# Patient Record
Sex: Female | Born: 1960 | Race: Black or African American | Hispanic: No | Marital: Married | Smoking: Never smoker
Health system: Southern US, Community
[De-identification: ages and names within clinical notes are randomized; demographics above are authoritative.]

## PROBLEM LIST (undated history)

## (undated) DIAGNOSIS — M1990 Unspecified osteoarthritis, unspecified site: Secondary | ICD-10-CM

## (undated) DIAGNOSIS — J45909 Unspecified asthma, uncomplicated: Secondary | ICD-10-CM

## (undated) DIAGNOSIS — K635 Polyp of colon: Secondary | ICD-10-CM

## (undated) DIAGNOSIS — I10 Essential (primary) hypertension: Secondary | ICD-10-CM

## (undated) DIAGNOSIS — E7800 Pure hypercholesterolemia, unspecified: Secondary | ICD-10-CM

## (undated) DIAGNOSIS — K219 Gastro-esophageal reflux disease without esophagitis: Secondary | ICD-10-CM

## (undated) DIAGNOSIS — N6019 Diffuse cystic mastopathy of unspecified breast: Secondary | ICD-10-CM

## (undated) DIAGNOSIS — R05 Cough: Secondary | ICD-10-CM

## (undated) DIAGNOSIS — R059 Cough, unspecified: Secondary | ICD-10-CM

## (undated) DIAGNOSIS — R252 Cramp and spasm: Secondary | ICD-10-CM

## (undated) DIAGNOSIS — K5900 Constipation, unspecified: Secondary | ICD-10-CM

## (undated) DIAGNOSIS — N6489 Other specified disorders of breast: Secondary | ICD-10-CM

## (undated) DIAGNOSIS — IMO0002 Reserved for concepts with insufficient information to code with codable children: Secondary | ICD-10-CM

## (undated) HISTORY — DX: Essential (primary) hypertension: I10

## (undated) HISTORY — DX: Polyp of colon: K63.5

## (undated) HISTORY — DX: Other specified disorders of breast: N64.89

## (undated) HISTORY — PX: TUBAL LIGATION: SHX77

## (undated) HISTORY — DX: Unspecified asthma, uncomplicated: J45.909

## (undated) HISTORY — PX: UTERINE FIBROID SURGERY: SHX826

## (undated) HISTORY — DX: Diffuse cystic mastopathy of unspecified breast: N60.19

## (undated) HISTORY — DX: Constipation, unspecified: K59.00

## (undated) HISTORY — DX: Reserved for concepts with insufficient information to code with codable children: IMO0002

## (undated) HISTORY — DX: Pure hypercholesterolemia, unspecified: E78.00

---

## 1989-07-24 HISTORY — PX: MYOMECTOMY: SHX85

## 1995-07-25 HISTORY — PX: ABDOMINAL HYSTERECTOMY: SHX81

## 1995-07-25 HISTORY — PX: OTHER SURGICAL HISTORY: SHX169

## 2004-11-10 ENCOUNTER — Ambulatory Visit: Payer: Self-pay | Admitting: General Surgery

## 2005-11-23 ENCOUNTER — Ambulatory Visit: Payer: Self-pay | Admitting: General Surgery

## 2006-11-27 ENCOUNTER — Ambulatory Visit: Payer: Self-pay | Admitting: General Surgery

## 2007-07-25 DIAGNOSIS — J4520 Mild intermittent asthma, uncomplicated: Secondary | ICD-10-CM | POA: Diagnosis present

## 2007-07-25 DIAGNOSIS — J45909 Unspecified asthma, uncomplicated: Secondary | ICD-10-CM

## 2007-07-25 HISTORY — DX: Unspecified asthma, uncomplicated: J45.909

## 2007-11-28 ENCOUNTER — Ambulatory Visit: Payer: Self-pay | Admitting: General Surgery

## 2008-09-04 ENCOUNTER — Ambulatory Visit: Payer: Self-pay

## 2008-12-01 ENCOUNTER — Ambulatory Visit: Payer: Self-pay | Admitting: General Surgery

## 2009-07-24 DIAGNOSIS — N6489 Other specified disorders of breast: Secondary | ICD-10-CM

## 2009-07-24 HISTORY — DX: Other specified disorders of breast: N64.89

## 2009-07-24 HISTORY — PX: BREAST SURGERY: SHX581

## 2009-12-24 ENCOUNTER — Ambulatory Visit: Payer: Self-pay | Admitting: General Surgery

## 2010-01-03 ENCOUNTER — Ambulatory Visit: Payer: Self-pay | Admitting: Family Medicine

## 2010-03-09 ENCOUNTER — Ambulatory Visit: Payer: Self-pay | Admitting: Family Medicine

## 2010-04-13 ENCOUNTER — Ambulatory Visit: Payer: Self-pay | Admitting: General Surgery

## 2010-12-26 ENCOUNTER — Ambulatory Visit: Payer: Self-pay | Admitting: General Surgery

## 2011-05-23 ENCOUNTER — Ambulatory Visit: Payer: Self-pay | Admitting: Family Medicine

## 2011-07-25 DIAGNOSIS — K635 Polyp of colon: Secondary | ICD-10-CM

## 2011-07-25 HISTORY — PX: NASAL SEPTUM SURGERY: SHX37

## 2011-07-25 HISTORY — DX: Polyp of colon: K63.5

## 2011-07-25 HISTORY — PX: COLONOSCOPY: SHX174

## 2011-08-11 ENCOUNTER — Ambulatory Visit: Payer: Self-pay

## 2011-11-23 ENCOUNTER — Ambulatory Visit: Payer: Self-pay | Admitting: Otolaryngology

## 2012-01-02 ENCOUNTER — Ambulatory Visit: Payer: Self-pay | Admitting: General Surgery

## 2012-02-14 ENCOUNTER — Ambulatory Visit: Payer: Self-pay | Admitting: General Surgery

## 2012-02-14 LAB — HM COLONOSCOPY

## 2012-04-30 ENCOUNTER — Ambulatory Visit: Payer: Self-pay | Admitting: Family Medicine

## 2012-06-06 ENCOUNTER — Ambulatory Visit: Payer: Self-pay | Admitting: Family Medicine

## 2013-01-02 ENCOUNTER — Ambulatory Visit: Payer: Self-pay | Admitting: General Surgery

## 2013-01-03 ENCOUNTER — Encounter: Payer: Self-pay | Admitting: General Surgery

## 2013-01-08 ENCOUNTER — Encounter: Payer: Self-pay | Admitting: General Surgery

## 2013-01-08 NOTE — Progress Notes (Unsigned)
This encounter was created in error - please disregard.

## 2013-01-09 ENCOUNTER — Ambulatory Visit (INDEPENDENT_AMBULATORY_CARE_PROVIDER_SITE_OTHER): Payer: BC Managed Care – PPO | Admitting: General Surgery

## 2013-01-09 ENCOUNTER — Encounter: Payer: Self-pay | Admitting: General Surgery

## 2013-01-09 VITALS — BP 122/84 | HR 70 | Resp 12 | Ht 65.0 in | Wt 209.0 lb

## 2013-01-09 DIAGNOSIS — Z860100 Personal history of colon polyps, unspecified: Secondary | ICD-10-CM | POA: Insufficient documentation

## 2013-01-09 DIAGNOSIS — Z1239 Encounter for other screening for malignant neoplasm of breast: Secondary | ICD-10-CM

## 2013-01-09 NOTE — Patient Instructions (Addendum)
Continue self breast exams. Call office for any new breast issues or concerns. Annual mammogram and office visit
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
## 2013-04-08 ENCOUNTER — Ambulatory Visit: Payer: Self-pay | Admitting: Family Medicine

## 2013-09-30 LAB — LIPID PANEL
CHOLESTEROL: 210 mg/dL — AB (ref 0–200)
HDL: 46 mg/dL (ref 35–70)
LDL Cholesterol: 155 mg/dL
TRIGLYCERIDES: 47 mg/dL (ref 40–160)

## 2013-09-30 LAB — BASIC METABOLIC PANEL
BUN: 12 mg/dL (ref 4–21)
CREATININE: 0.8 mg/dL (ref 0.5–1.1)
Glucose: 97 mg/dL
POTASSIUM: 3.7 mmol/L (ref 3.4–5.3)
SODIUM: 143 mmol/L (ref 137–147)

## 2013-09-30 LAB — TSH: TSH: 2.85 u[IU]/mL (ref 0.41–5.90)

## 2013-09-30 LAB — HEPATIC FUNCTION PANEL
ALT: 21 U/L (ref 7–35)
AST: 17 U/L (ref 13–35)
Alkaline Phosphatase: 70 U/L (ref 25–125)
Bilirubin, Total: 0.6 mg/dL

## 2013-11-11 ENCOUNTER — Ambulatory Visit: Payer: Self-pay | Admitting: Obstetrics and Gynecology

## 2014-01-19 ENCOUNTER — Encounter: Payer: Self-pay | Admitting: General Surgery

## 2014-01-27 ENCOUNTER — Encounter: Payer: Self-pay | Admitting: General Surgery

## 2014-01-27 ENCOUNTER — Ambulatory Visit (INDEPENDENT_AMBULATORY_CARE_PROVIDER_SITE_OTHER): Payer: BC Managed Care – PPO | Admitting: General Surgery

## 2014-01-27 VITALS — BP 126/76 | HR 80 | Resp 12 | Ht 65.0 in | Wt 216.0 lb

## 2014-01-27 DIAGNOSIS — Z86010 Personal history of colonic polyps: Secondary | ICD-10-CM

## 2014-01-27 DIAGNOSIS — N6019 Diffuse cystic mastopathy of unspecified breast: Secondary | ICD-10-CM

## 2014-01-27 NOTE — Patient Instructions (Addendum)
The patient has been asked to return to the office in one year with a bilateral screening mammogram. Continue self breast exams. Call office for any new breast issues or concerns.  

## 2014-01-27 NOTE — Progress Notes (Signed)
Patient ID: Gabrielle Terry, female   DOB: 01-10-1961, 53 y.o.   MRN: [id number]  Chief Complaint  Patient presents with  . Follow-up    mammogram    HPI Gabrielle Terry is a 53 y.o. female Who presents for a breast evaluation. The most recent mammogram was done on 01/15/14.Patient does  Perform self breast checks and get regular mammograms.   HPI  Past Medical History  Diagnosis Date  . Hypertension   . Asthma 2009  . Ulcer   . Diffuse cystic mastopathy   . Breast hematoma 2011    Right  . Colon polyp 2013    3    Past Surgical History  Procedure Laterality Date  . Cesarean section  1985, 1992  . Salpingo oophorectmy  1997  . Abdominal hysterectomy  1997  . Breast surgery Right 2011    hematoma  . Myomectomy  1991  . Nasal septum surgery  2013  . Colonoscopy  2013    3 polyps/ Dr Jamal Collin    History reviewed. No pertinent family history.  Social History History  Substance Use Topics  . Smoking status: Never Smoker   . Smokeless tobacco: Never Used  . Alcohol Use: No    No Known Allergies  Current Outpatient Prescriptions  Medication Sig Dispense Refill  . albuterol (PROVENTIL HFA;VENTOLIN HFA) 108 (90 BASE) MCG/ACT inhaler Inhale 2 puffs into the lungs every 6 (six) hours as needed for wheezing.      Marland Kitchen amLODipine (NORVASC) 2.5 MG tablet Take 1 tablet by mouth daily.      . Fluticasone-Salmeterol (ADVAIR) 250-50 MCG/DOSE AEPB Inhale 1 puff into the lungs every 12 (twelve) hours.      Marland Kitchen losartan (COZAAR) 25 MG tablet Take 25 mg by mouth daily.      Marland Kitchen losartan-hydrochlorothiazide (HYZAAR) 100-25 MG per tablet Take 1 tablet by mouth daily.      . montelukast (SINGULAIR) 10 MG tablet Take 10 mg by mouth at bedtime.      . ranitidine (ZANTAC) 150 MG capsule Take 150 mg by mouth daily.       No current facility-administered medications for this visit.    Review of Systems Review of Systems  Constitutional: Negative.   Respiratory: Negative.   Cardiovascular:  Negative.     Blood pressure 126/76, pulse 80, resp. rate 12, height 5\' 5"  (1.651 m), weight 216 lb (97.977 kg).  Physical Exam Physical Exam  Constitutional: She is oriented to person, place, and time. She appears well-developed and well-nourished.  Eyes: Conjunctivae are normal. No scleral icterus.  Neck: Neck supple.  Cardiovascular: Normal rate, regular rhythm and normal heart sounds.   Pulmonary/Chest: Effort normal and breath sounds normal. Right breast exhibits no inverted nipple, no mass, no nipple discharge, no skin change and no tenderness. Left breast exhibits no inverted nipple, no mass, no nipple discharge, no skin change and no tenderness.  Abdominal: Soft. Bowel sounds are normal. There is no tenderness.  Lymphadenopathy:    She has no cervical adenopathy.    She has no axillary adenopathy.  Neurological: She is alert and oriented to person, place, and time.  Skin: Skin is warm and dry.    Data Reviewed Mammogram reviewed  Assessment    Stable exam     Plan    The patient has been asked to return to the office in one year with a bilateral screening mammogram.        Annora Guderian G 01/28/2014, 5:59 AM

## 2014-01-28 ENCOUNTER — Encounter: Payer: Self-pay | Admitting: General Surgery

## 2014-01-28 DIAGNOSIS — N6019 Diffuse cystic mastopathy of unspecified breast: Secondary | ICD-10-CM | POA: Insufficient documentation

## 2014-02-19 ENCOUNTER — Ambulatory Visit: Payer: Self-pay | Admitting: Family Medicine

## 2014-02-21 ENCOUNTER — Ambulatory Visit: Payer: Self-pay | Admitting: Family Medicine

## 2014-05-25 ENCOUNTER — Encounter: Payer: Self-pay | Admitting: General Surgery

## 2014-10-27 LAB — CBC AND DIFFERENTIAL
HCT: 40 % (ref 36–46)
HEMOGLOBIN: 13.3 g/dL (ref 12.0–16.0)
Neutrophils Absolute: 4 /uL
Platelets: 241 10*3/uL (ref 150–399)
WBC: 7.4 10^3/mL

## 2014-11-25 ENCOUNTER — Other Ambulatory Visit: Payer: Self-pay

## 2014-11-25 DIAGNOSIS — Z1231 Encounter for screening mammogram for malignant neoplasm of breast: Secondary | ICD-10-CM

## 2014-12-18 DIAGNOSIS — M1710 Unilateral primary osteoarthritis, unspecified knee: Secondary | ICD-10-CM | POA: Insufficient documentation

## 2014-12-18 DIAGNOSIS — M179 Osteoarthritis of knee, unspecified: Secondary | ICD-10-CM | POA: Insufficient documentation

## 2015-01-27 ENCOUNTER — Encounter: Payer: Self-pay | Admitting: General Surgery

## 2015-01-27 ENCOUNTER — Ambulatory Visit (INDEPENDENT_AMBULATORY_CARE_PROVIDER_SITE_OTHER): Payer: BLUE CROSS/BLUE SHIELD | Admitting: General Surgery

## 2015-01-27 VITALS — BP 120/76 | HR 76 | Resp 12 | Ht 65.0 in | Wt 198.0 lb

## 2015-01-27 DIAGNOSIS — N6019 Diffuse cystic mastopathy of unspecified breast: Secondary | ICD-10-CM | POA: Diagnosis not present

## 2015-01-27 NOTE — Patient Instructions (Addendum)
Continue self breast exams. Call office for any new breast issues or concerns. Patient will be asked to return to the office in one year with a bilateral screening mammogram.

## 2015-01-27 NOTE — Progress Notes (Signed)
Patient ID: Gabrielle Terry, female   DOB: Jul 10, 1961, 54 y.o.   MRN: [id number]  Chief Complaint  Patient presents with  . Follow-up    HPI Gabrielle Terry is a 54 y.o. female.  who presents for a breast evaluation. The most recent mammogram was done on 01-20-15  Patient does perform regular self breast checks and gets regular mammograms done.  No new breast issues.  HPI  Past Medical History  Diagnosis Date  . Hypertension   . Asthma 2009  . Ulcer   . Diffuse cystic mastopathy   . Breast hematoma 2011    Right  . Colon polyp 2013    3    Past Surgical History  Procedure Laterality Date  . Cesarean section  1985, 1992  . Salpingo oophorectmy  1997  . Abdominal hysterectomy  1997  . Breast surgery Right 2011    hematoma  . Myomectomy  1991  . Nasal septum surgery  2013  . Colonoscopy  2013    3 polyps/ Dr Jamal Collin    History reviewed. No pertinent family history.  Social History History  Substance Use Topics  . Smoking status: Never Smoker   . Smokeless tobacco: Never Used  . Alcohol Use: No    No Known Allergies  Current Outpatient Prescriptions  Medication Sig Dispense Refill  . albuterol (PROVENTIL HFA;VENTOLIN HFA) 108 (90 BASE) MCG/ACT inhaler Inhale 2 puffs into the lungs every 6 (six) hours as needed for wheezing.    Marland Kitchen amLODipine (NORVASC) 2.5 MG tablet Take 1 tablet by mouth daily.    . diclofenac (VOLTAREN) 75 MG EC tablet TK 1 T PO  BID WITH FOOD  2  . Fluticasone-Salmeterol (ADVAIR) 250-50 MCG/DOSE AEPB Inhale 1 puff into the lungs every 12 (twelve) hours.    Marland Kitchen losartan-hydrochlorothiazide (HYZAAR) 100-25 MG per tablet Take 1 tablet by mouth daily.    Marland Kitchen lovastatin (MEVACOR) 20 MG tablet TK 1 T PO D HS  6  . MAGNESIUM-OXIDE 400 (241.3 MG) MG tablet TK 1 T PO TWO TIMES D  12  . montelukast (SINGULAIR) 10 MG tablet Take 10 mg by mouth at bedtime.    . ranitidine (ZANTAC) 150 MG capsule Take 150 mg by mouth daily.     No current facility-administered  medications for this visit.    Review of Systems Review of Systems  Constitutional: Negative.   Respiratory: Negative.   Cardiovascular: Negative.     Blood pressure 120/76, pulse 76, resp. rate 12, height 5\' 5"  (1.651 m), weight 198 lb (89.812 kg).  Physical Exam Physical Exam  Constitutional: She is oriented to person, place, and time. She appears well-developed and well-nourished.  HENT:  Mouth/Throat: Oropharynx is clear and moist.  Eyes: Conjunctivae are normal. No scleral icterus.  Neck: Neck supple.  Cardiovascular: Normal rate, regular rhythm and normal heart sounds.   Pulmonary/Chest: Effort normal and breath sounds normal. Right breast exhibits no inverted nipple, no mass, no nipple discharge, no skin change and no tenderness. Left breast exhibits no inverted nipple, no mass, no nipple discharge, no skin change and no tenderness.  Abdominal: Soft. There is no hepatomegaly. There is no tenderness.  Lymphadenopathy:    She has no cervical adenopathy.    She has no axillary adenopathy.  Neurological: She is alert and oriented to person, place, and time.  Skin: Skin is warm and dry.    Data Reviewed Mammogram reviewed and stable.   Assessment    Diffuse cystic mastopathy. Stable.  Plan    Continue self breast exams. Call office for any new breast issues or concerns. Patient will be asked to return to the office in one year with a bilateral screening mammogram.      PCP:  Cranford Mon, Mellissa Kohut 01/27/2015, 8:22 PM

## 2015-01-29 ENCOUNTER — Ambulatory Visit: Payer: Self-pay | Admitting: Family Medicine

## 2015-02-01 ENCOUNTER — Ambulatory Visit (INDEPENDENT_AMBULATORY_CARE_PROVIDER_SITE_OTHER): Payer: BLUE CROSS/BLUE SHIELD | Admitting: Family Medicine

## 2015-02-01 ENCOUNTER — Encounter: Payer: Self-pay | Admitting: Family Medicine

## 2015-02-01 VITALS — BP 98/52 | HR 80 | Temp 98.4°F | Resp 16 | Ht 65.0 in | Wt 215.0 lb

## 2015-02-01 DIAGNOSIS — J4540 Moderate persistent asthma, uncomplicated: Secondary | ICD-10-CM | POA: Diagnosis not present

## 2015-02-01 DIAGNOSIS — J019 Acute sinusitis, unspecified: Secondary | ICD-10-CM | POA: Diagnosis not present

## 2015-02-01 MED ORDER — FLUTICASONE-SALMETEROL 230-21 MCG/ACT IN AERO: 2.0000 | INHALATION_SPRAY | Freq: Two times a day (BID) | RESPIRATORY_TRACT | Status: DC

## 2015-02-01 MED ORDER — CEFDINIR 300 MG PO CAPS: 300.0000 mg | ORAL_CAPSULE | Freq: Two times a day (BID) | ORAL | Status: DC

## 2015-02-01 NOTE — Progress Notes (Signed)
Subjective:     Patient ID: Gabrielle Terry, female   DOB: 05-30-1961, 54 y.o.   MRN: [id number]  HPI  Chief Complaint  Patient presents with  . Cough    Patient comes in office today with concerns of cough and congestion for 6 months. She states that cough is productive of yellowish phlegm that has now turned into a dark color  States she has run out of her Advair inhaler and has been using albuterol 3 x day Reports yellow to dark sinus congestion with PND and accompanying cough.   Review of Systems  HENT:       States she has an ENT in [place] and is being considered for sinus surgery.   Respiratory:       Followed by Dr. Orvil Feil for allergies/asthma       Objective:   Physical Exam  Constitutional: She appears well-developed and well-nourished. She appears distressed.  Ears: T.M's intact without inflammation Sinuses: non-tender Throat: no tonsillar enlargement or exudate Neck: no cervical adenopathy Lungs: transient inspiratory wheezes     Assessment:    1. Acute sinusitis, recurrence not specified, unspecified locatio - cefdinir (OMNICEF) 300 MG capsule; Take 1 capsule (300 mg total) by mouth 2 (two) times daily.  Dispense: 20 capsule; Refill: 0  2. Asthma, moderate persistent, uncomplicated - fluticasone-salmeterol (ADVAIR HFA) 230-21 MCG/ACT inhaler; Inhale 2 puffs into the lungs 2 (two) times daily.  Dispense: 1 Inhaler; Refill: 12    Plan:   Resume Advair and continue albuterol. Call if asthma worsens for addition of oral prednisone.

## 2015-02-01 NOTE — Patient Instructions (Signed)
Continue albuterol as needed.Call if your asthma worsens despite treatment for course of prednisone.

## 2015-04-07 ENCOUNTER — Other Ambulatory Visit: Payer: Self-pay | Admitting: Family Medicine

## 2015-04-07 DIAGNOSIS — I10 Essential (primary) hypertension: Secondary | ICD-10-CM

## 2015-04-07 DIAGNOSIS — J309 Allergic rhinitis, unspecified: Secondary | ICD-10-CM | POA: Insufficient documentation

## 2015-04-07 NOTE — Telephone Encounter (Signed)
Last OV 10/2014  Thanks,   -Laura  

## 2015-04-09 NOTE — Telephone Encounter (Signed)
Pt is requesting refill for losartan-hydrochlorothiazide (HYZAAR) 100-25 MG per tablet to [place] stated that she took her last dose this morning. Thanks TNP

## 2015-04-12 ENCOUNTER — Other Ambulatory Visit: Payer: Self-pay

## 2015-04-12 DIAGNOSIS — I10 Essential (primary) hypertension: Secondary | ICD-10-CM

## 2015-04-12 MED ORDER — LOSARTAN POTASSIUM-HCTZ 100-25 MG PO TABS: 1.0000 | ORAL_TABLET | Freq: Every day | ORAL | Status: DC

## 2015-04-21 ENCOUNTER — Encounter: Payer: Self-pay | Admitting: *Deleted

## 2015-04-28 NOTE — Discharge Instructions (Signed)
[facility] °[facility] °ENDOSCOPIC SINUS SURGERY °[facility] ° °What is Functional Endoscopic Sinus Surgery? ° The Surgery involves making the natural openings of the sinuses larger by removing the bony partitions that separate the sinuses from the nasal cavity.  The natural sinus lining is preserved as much as possible to allow the sinuses to resume normal function after the surgery.  In some patients nasal polyps (excessively swollen lining of the sinuses) may be removed to relieve obstruction of the sinus openings.  The surgery is performed through the nose using lighted scopes, which eliminates the need for incisions on the face.  A septoplasty is a different procedure which is sometimes performed with sinus surgery.  It involves straightening the boy partition that separates the two sides of your nose.  A crooked or deviated septum may need repair if is obstructing the sinuses or nasal airflow.  Turbinate reduction is also often performed during sinus surgery.  The turbinates are bony proturberances from the side walls of the nose which swell and can obstruct the nose in patients with sinus and allergy problems.  Their size can be surgically reduced to help relieve nasal obstruction. ° °What Can Sinus Surgery Do For Me? ° Sinus surgery can reduce the frequency of sinus infections requiring antibiotic treatment.  This can provide improvement in nasal congestion, post-nasal drainage, facial pressure and nasal obstruction.  Surgery will NOT prevent you from ever having an infection again, so it usually only for patients who get infections 4 or more times yearly requiring antibiotics, or for infections that do not clear with antibiotics.  It will not cure nasal allergies, so patients with allergies may still require medication to treat their allergies after surgery. Surgery may improve headaches related to sinusitis, however, some people will continue to  require medication to control sinus headaches related to allergies.  Surgery will do nothing for other forms of headache (migraine, tension or cluster). ° °What Are the Risks of Endoscopic Sinus Surgery? ° Current techniques allow surgery to be performed safely with little risk, however, there are rare complications that patients should be aware of.  Because the sinuses are located around the eyes, there is risk of eye injury, including blindness, though again, this would be quite rare. This is usually a result of bleeding behind the eye during surgery, which puts the vision oat risk, though there are treatments to protect the vision and prevent permanent disrupted by surgery causing a leak of the spinal fluid that surrounds the brain.  More serious complications would include bleeding inside the brain cavity or damage to the brain.  Again, all of these complications are uncommon, and spinal fluid leaks can be safely managed surgically if they occur.  The most common complication of sinus surgery is bleeding from the nose, which may require packing or cauterization of the nose.  Continued sinus have polyps may experience recurrence of the polyps requiring revision surgery.  Alterations of sense of smell or injury to the tear ducts are also rare complications.  ° °What is the Surgery Like, and what is the Recovery? ° The Surgery usually takes a couple of hours to perform, and is usually performed under a general anesthetic (completely asleep).  Patients are usually discharged home after a couple of hours.  Sometimes during surgery it is necessary to pack the nose to control bleeding, and the packing is left in place for 24 - 48 hours, and removed by your surgeon.    If a septoplasty was performed during the procedure, there is often a splint placed which must be removed after 5-7 days.   °Discomfort: Pain is usually mild to moderate, and can be controlled by prescription pain medication or acetaminophen (Tylenol).   Aspirin, Ibuprofen (Advil, Motrin), or Naprosyn (Aleve) should be avoided, as they can cause increased bleeding.  Most patients feel sinus pressure like they have a bad head cold for several days.  Sleeping with your head elevated can help reduce swelling and facial pressure, as can ice packs over the face.  A humidifier may be helpful to keep the mucous and blood from drying in the nose.  ° °Diet: There are no specific diet restrictions, however, you should generally start with clear liquids and a light diet of bland foods because the anesthetic can cause some nausea.  Advance your diet depending on how your stomach feels.  Taking your pain medication with food will often help reduce stomach upset which pain medications can cause. ° °Nasal Saline Irrigation: It is important to remove blood clots and dried mucous from the nose as it is healing.  This is done by having you irrigate the nose at least 3 - 4 times daily with a salt water solution.  We recommend using NeilMed Sinus Rinse (available at the drug store).  Fill the squeeze bottle with the solution, bend over a sink, and insert the tip of the squeeze bottle into the nose ½ of an inch.  Point the tip of the squeeze bottle towards the inside corner of the eye on the same side your irrigating.  Squeeze the bottle and gently irrigate the nose.  If you bend forward as you do this, most of the fluid will flow back out of the nose, instead of down your throat.   The solution should be warm, near body temperature, when you irrigate.   Each time you irrigate, you should use a full squeeze bottle.  ° °Note that if you are instructed to use Nasal Steroid Sprays at any time after your surgery, irrigate with saline BEFORE using the steroid spray, so you do not wash it all out of the nose. °Another product, Nasal Saline Gel (such as AYR Nasal Saline Gel) can be applied in each nostril 3 - 4 times daily to moisture the nose and reduce scabbing or crusting. ° °Bleeding:   Bloody drainage from the nose can be expected for several days, and patients are instructed to irrigate their nose frequently with salt water to help remove mucous and blood clots.  The drainage may be dark red or brown, though some fresh blood may be seen intermittently, especially after irrigation.  Do not blow you nose, as bleeding may occur. If you must sneeze, keep your mouth open to allow air to escape through your mouth. ° °If heavy bleeding occurs: Irrigate the nose with saline to rinse out clots, then spray the nose 3 - 4 times with Afrin Nasal Decongestant Spray.  The spray will constrict the blood vessels to slow bleeding.  Pinch the lower half of your nose shut to apply pressure, and lay down with your head elevated.  Ice packs over the nose may help as well. If bleeding persists despite these measures, you should notify your doctor.  Do not use the Afrin routinely to control nasal congestion after surgery, as it can result in worsening congestion and may affect healing.  ° ° ° °Activity: Return to work varies among patients. Most patients will be   out of work at least 5 - 7 days to recover.  Patient may return to work after they are off of narcotic pain medication, and feeling well enough to perform the functions of their job.  Patients must avoid heavy lifting (over 10 pounds) or strenuous physical for 2 weeks after surgery, so your employer may need to assign you to light duty, or keep you out of work longer if light duty is not possible.  NOTE: you should not drive, operate dangerous machinery, do any mentally demanding tasks or make any important legal or financial decisions while on narcotic pain medication and recovering from the general anesthetic.  °  °Call Your Doctor Immediately if You Have Any of the Following: °1. Bleeding that you cannot control with the above measures °2. Loss of vision, double vision, bulging of the eye or black eyes. °3. Fever over 101 degrees °4. Neck stiffness with  severe headache, fever, nausea and change in mental state. °You are always encourage to call anytime with concerns, however, please call with requests for pain medication refills during office hours. ° °Office Endoscopy: During follow-up visits your doctor will remove any packing or splints that may have been placed and evaluate and clean your sinuses endoscopically.  Topical anesthetic will be used to make this as comfortable as possible, though you may want to take your pain medication prior to the visit.  How often this will need to be done varies from patient to patient.  After complete recovery from the surgery, you may need follow-up endoscopy from time to time, particularly if there is concern of recurrent infection or nasal polyps. ° °General Anesthesia, Adult, Care After °Refer to this sheet in the next few weeks. These instructions provide you with information on caring for yourself after your procedure. Your health care provider may also give you more specific instructions. Your treatment has been planned according to current medical practices, but problems sometimes occur. Call your health care provider if you have any problems or questions after your procedure. °WHAT TO EXPECT AFTER THE PROCEDURE °After the procedure, it is typical to experience: °· Sleepiness. °· Nausea and vomiting. °HOME CARE INSTRUCTIONS °· For the first 24 hours after general anesthesia: °¨ Have a responsible person with you. °¨ Do not drive a car. If you are alone, do not take public transportation. °¨ Do not drink alcohol. °¨ Do not take medicine that has not been prescribed by your health care provider. °¨ Do not sign important papers or make important decisions. °¨ You may resume a normal diet and activities as directed by your health care provider. °· Change bandages (dressings) as directed. °· If you have questions or problems that seem related to general anesthesia, call the hospital and ask for the anesthetist or  anesthesiologist on call. °SEEK MEDICAL CARE IF: °· You have nausea and vomiting that continue the day after anesthesia. °· You develop a rash. °SEEK IMMEDIATE MEDICAL CARE IF:  °· You have difficulty breathing. °· You have chest pain. °· You have any allergic problems. °  °This information is not intended to replace advice given to you by your health care provider. Make sure you discuss any questions you have with your health care provider. °  °Document Released: 10/16/2000 Document Revised: 07/31/2014 Document Reviewed: 11/08/2011 °Elsevier Interactive Patient Education ©2016 [employer] ° °

## 2015-04-29 ENCOUNTER — Ambulatory Visit: Payer: BLUE CROSS/BLUE SHIELD | Admitting: Anesthesiology

## 2015-04-29 ENCOUNTER — Ambulatory Visit
Admission: RE | Admit: 2015-04-29 | Discharge: 2015-04-29 | Disposition: A | Discharge status: Home / Self Care | Payer: BLUE CROSS/BLUE SHIELD | Source: Ambulatory Visit | Attending: Otolaryngology | Admitting: Otolaryngology

## 2015-04-29 ENCOUNTER — Encounter
Admission: RE | Disposition: A | Discharge status: Home / Self Care | Payer: Self-pay | Source: Ambulatory Visit | Attending: Otolaryngology

## 2015-04-29 DIAGNOSIS — J322 Chronic ethmoidal sinusitis: Secondary | ICD-10-CM | POA: Insufficient documentation

## 2015-04-29 DIAGNOSIS — J321 Chronic frontal sinusitis: Secondary | ICD-10-CM | POA: Insufficient documentation

## 2015-04-29 DIAGNOSIS — J320 Chronic maxillary sinusitis: Secondary | ICD-10-CM | POA: Insufficient documentation

## 2015-04-29 DIAGNOSIS — Z7951 Long term (current) use of inhaled steroids: Secondary | ICD-10-CM | POA: Diagnosis not present

## 2015-04-29 DIAGNOSIS — Z825 Family history of asthma and other chronic lower respiratory diseases: Secondary | ICD-10-CM | POA: Diagnosis not present

## 2015-04-29 DIAGNOSIS — Z79899 Other long term (current) drug therapy: Secondary | ICD-10-CM | POA: Insufficient documentation

## 2015-04-29 DIAGNOSIS — Z6835 Body mass index (BMI) 35.0-35.9, adult: Secondary | ICD-10-CM | POA: Insufficient documentation

## 2015-04-29 DIAGNOSIS — E669 Obesity, unspecified: Secondary | ICD-10-CM | POA: Insufficient documentation

## 2015-04-29 DIAGNOSIS — M542 Cervicalgia: Secondary | ICD-10-CM | POA: Diagnosis not present

## 2015-04-29 DIAGNOSIS — I10 Essential (primary) hypertension: Secondary | ICD-10-CM | POA: Insufficient documentation

## 2015-04-29 DIAGNOSIS — G8929 Other chronic pain: Secondary | ICD-10-CM | POA: Insufficient documentation

## 2015-04-29 DIAGNOSIS — J45909 Unspecified asthma, uncomplicated: Secondary | ICD-10-CM | POA: Diagnosis not present

## 2015-04-29 DIAGNOSIS — M722 Plantar fascial fibromatosis: Secondary | ICD-10-CM | POA: Diagnosis not present

## 2015-04-29 DIAGNOSIS — J323 Chronic sphenoidal sinusitis: Secondary | ICD-10-CM | POA: Diagnosis not present

## 2015-04-29 DIAGNOSIS — M25569 Pain in unspecified knee: Secondary | ICD-10-CM | POA: Insufficient documentation

## 2015-04-29 DIAGNOSIS — Z791 Long term (current) use of non-steroidal anti-inflammatories (NSAID): Secondary | ICD-10-CM | POA: Diagnosis not present

## 2015-04-29 DIAGNOSIS — J338 Other polyp of sinus: Secondary | ICD-10-CM | POA: Diagnosis not present

## 2015-04-29 DIAGNOSIS — Z8489 Family history of other specified conditions: Secondary | ICD-10-CM | POA: Diagnosis not present

## 2015-04-29 DIAGNOSIS — Z8249 Family history of ischemic heart disease and other diseases of the circulatory system: Secondary | ICD-10-CM | POA: Insufficient documentation

## 2015-04-29 DIAGNOSIS — M25519 Pain in unspecified shoulder: Secondary | ICD-10-CM | POA: Diagnosis not present

## 2015-04-29 HISTORY — DX: Gastro-esophageal reflux disease without esophagitis: K21.9

## 2015-04-29 HISTORY — PX: FRONTAL SINUS EXPLORATION: SHX6591

## 2015-04-29 HISTORY — DX: Unspecified osteoarthritis, unspecified site: M19.90

## 2015-04-29 HISTORY — DX: Cough, unspecified: R05.9

## 2015-04-29 HISTORY — DX: Cramp and spasm: R25.2

## 2015-04-29 HISTORY — DX: Cough: R05

## 2015-04-29 HISTORY — PX: IMAGE GUIDED SINUS SURGERY: SHX6570

## 2015-04-29 HISTORY — PX: ETHMOIDECTOMY: SHX5197

## 2015-04-29 HISTORY — PX: MAXILLARY ANTROSTOMY: SHX2003

## 2015-04-29 HISTORY — PX: SPHENOIDECTOMY: SHX2421

## 2015-04-29 HISTORY — DX: Pure hypercholesterolemia, unspecified: E78.00

## 2015-04-29 SURGERY — SINUS SURGERY, WITH IMAGING GUIDANCE
Anesthesia: General | Wound class: Clean Contaminated

## 2015-04-29 MED ORDER — CEFAZOLIN SODIUM-DEXTROSE 2-3 GM-% IV SOLR
2.0000 g | Freq: Once | INTRAVENOUS | Status: AC
  Administered 2015-04-29: 2 g via INTRAVENOUS

## 2015-04-29 MED ORDER — LIDOCAINE HCL (CARDIAC) 20 MG/ML IV SOLN
INTRAVENOUS | Status: DC | PRN
  Administered 2015-04-29: 40 mg via INTRAVENOUS

## 2015-04-29 MED ORDER — LACTATED RINGERS IV SOLN
INTRAVENOUS | Status: DC
  Administered 2015-04-29 (×2): via INTRAVENOUS

## 2015-04-29 MED ORDER — SCOPOLAMINE 1 MG/3DAYS TD PT72
1.0000 | MEDICATED_PATCH | TRANSDERMAL | Status: DC
  Administered 2015-04-29: 1.5 mg via TRANSDERMAL

## 2015-04-29 MED ORDER — MIDAZOLAM HCL 5 MG/5ML IJ SOLN
INTRAMUSCULAR | Status: DC | PRN
  Administered 2015-04-29: 2 mg via INTRAVENOUS

## 2015-04-29 MED ORDER — OXYCODONE HCL 5 MG PO TABS: 5.0000 mg | ORAL_TABLET | Freq: Once | ORAL | Status: DC | PRN

## 2015-04-29 MED ORDER — OXYMETAZOLINE HCL 0.05 % NA SOLN
2.0000 | Freq: Once | NASAL | Status: AC
  Administered 2015-04-29: 2 via NASAL

## 2015-04-29 MED ORDER — ACETAMINOPHEN 10 MG/ML IV SOLN
1000.0000 mg | Freq: Once | INTRAVENOUS | Status: AC
  Administered 2015-04-29: 1000 mg via INTRAVENOUS

## 2015-04-29 MED ORDER — OXYCODONE HCL 5 MG/5ML PO SOLN: 5.0000 mg | Freq: Once | ORAL | Status: DC | PRN

## 2015-04-29 MED ORDER — FENTANYL CITRATE (PF) 100 MCG/2ML IJ SOLN: 25.0000 ug | INTRAMUSCULAR | Status: DC | PRN

## 2015-04-29 MED ORDER — DEXAMETHASONE SODIUM PHOSPHATE 4 MG/ML IJ SOLN
INTRAMUSCULAR | Status: DC | PRN
  Administered 2015-04-29: 10 mg via INTRAVENOUS

## 2015-04-29 MED ORDER — LABETALOL HCL 5 MG/ML IV SOLN
INTRAVENOUS | Status: DC | PRN
  Administered 2015-04-29: 5 mg via INTRAVENOUS

## 2015-04-29 MED ORDER — LIDOCAINE-EPINEPHRINE 1 %-1:100000 IJ SOLN
INTRAMUSCULAR | Status: DC | PRN
  Administered 2015-04-29: 3 mL

## 2015-04-29 MED ORDER — PHENYLEPHRINE HCL 0.5 % NA SOLN
NASAL | Status: DC | PRN
  Administered 2015-04-29: 30 mL via TOPICAL

## 2015-04-29 MED ORDER — FENTANYL CITRATE (PF) 100 MCG/2ML IJ SOLN
INTRAMUSCULAR | Status: DC | PRN
  Administered 2015-04-29 (×2): 50 ug via INTRAVENOUS
  Administered 2015-04-29: 100 ug via INTRAVENOUS
  Administered 2015-04-29 (×2): 50 ug via INTRAVENOUS

## 2015-04-29 MED ORDER — ONDANSETRON HCL 4 MG/2ML IJ SOLN
INTRAMUSCULAR | Status: DC | PRN
  Administered 2015-04-29: 4 mg via INTRAVENOUS

## 2015-04-29 MED ORDER — PROPOFOL 10 MG/ML IV BOLUS
INTRAVENOUS | Status: DC | PRN
  Administered 2015-04-29: 150 mg via INTRAVENOUS

## 2015-04-29 MED ORDER — ROCURONIUM BROMIDE 100 MG/10ML IV SOLN
INTRAVENOUS | Status: DC | PRN
  Administered 2015-04-29: 30 mg via INTRAVENOUS

## 2015-04-29 SURGICAL SUPPLY — 30 items
BALLOON SINUPLASTY SYSTEM (BALLOONS) IMPLANT
BATTERY INSTRU NAVIGATION (MISCELLANEOUS) ×12 IMPLANT
CANISTER SUCT 1200ML W/VALVE (MISCELLANEOUS) ×3 IMPLANT
CATH IV 18X1 1/4 SAFELET (CATHETERS) ×3 IMPLANT
COAGULATOR SUCT 8FR VV (MISCELLANEOUS) ×3 IMPLANT
DEVICE INFLATION SEID (MISCELLANEOUS) IMPLANT
DRAPE HEAD BAR (DRAPES) ×3 IMPLANT
GLOVE PI ULTRA LF STRL 7.5 (GLOVE) ×6 IMPLANT
GLOVE PI ULTRA NON LATEX 7.5 (GLOVE) ×3
IMPL PROPEL SINUS 23MML (Prosthesis and Implant ENT) ×4 IMPLANT
IMPLANT PROPEL SINUS 23MML (Prosthesis and Implant ENT) ×6 IMPLANT
IRRIGATOR 4MM STR (IRRIGATION / IRRIGATOR) ×3 IMPLANT
IV CATH 18X1 1/4 SAFELET (CATHETERS) ×2
IV NS 500ML (IV SOLUTION) ×2
IV NS 500ML BAXH (IV SOLUTION) ×4 IMPLANT
NAVIGATION MASK REG  ST (MISCELLANEOUS) ×3 IMPLANT
NEEDLE HYPO 25GX1X1/2 BEV (NEEDLE) ×3 IMPLANT
NEEDLE SPNL 25GX3.5 QUINCKE BL (NEEDLE) ×3 IMPLANT
NS IRRIG 500ML POUR BTL (IV SOLUTION) ×3 IMPLANT
PACK DRAPE NASAL/ENT (PACKS) ×3 IMPLANT
PACKING NASAL EPIS 4X2.4 XEROG (MISCELLANEOUS) ×3 IMPLANT
PAD GROUND ADULT SPLIT (MISCELLANEOUS) ×3 IMPLANT
PATTIES SURGICAL .5 X3 (DISPOSABLE) ×3 IMPLANT
SET HANDPIECE IRR DIEGO (MISCELLANEOUS) ×3 IMPLANT
SOL ANTI-FOG 6CC FOG-OUT (MISCELLANEOUS) ×2 IMPLANT
SOL FOG-OUT ANTI-FOG 6CC (MISCELLANEOUS) ×1
STRAP BODY AND KNEE 60X3 (MISCELLANEOUS) ×6 IMPLANT
SYR 3ML LL SCALE MARK (SYRINGE) ×3 IMPLANT
TOWEL OR 17X26 4PK STRL BLUE (TOWEL DISPOSABLE) ×3 IMPLANT
WATER STERILE IRR 500ML POUR (IV SOLUTION) ×3 IMPLANT

## 2015-04-29 NOTE — Transfer of Care (Signed)
Immediate Anesthesia Transfer of Care Note  Patient: Gabrielle Terry  Procedure(s) Performed: Procedure(s) with comments: IMAGE GUIDED SINUS SURGERY (N/A) - GAVE DISK TO CE CE MAXILLARY ANTROSTOMY WITH REMOVAL OF CONTENTS (Bilateral) FRONTAL SINUS EXPLORATION (Bilateral) TOTAL ETHMOIDECTOMY (Bilateral) SPHENOIDECTOMY (Bilateral)  Patient Location: PACU  Anesthesia Type: General ETT  Level of Consciousness: awake, alert  and patient cooperative  Airway and Oxygen Therapy: Patient Spontanous Breathing and Patient connected to supplemental oxygen  Post-op Assessment: Post-op Vital signs reviewed, Patient's Cardiovascular Status Stable, Respiratory Function Stable, Patent Airway and No signs of Nausea or vomiting  Post-op Vital Signs: Reviewed and stable  Complications: No apparent anesthesia complications

## 2015-04-29 NOTE — H&P (Signed)
  H&P has been reviewed and no changes necessary. To be downloaded later. 

## 2015-04-29 NOTE — Op Note (Signed)
04/29/2015  1:41 PM    Gabrielle Terry  [id number]   Pre-Op Dx:  Bilateral frontal sinusitis, bilateral ethmoid sinusitis, bilateral maxillary sinusitis, bilateral sphenoid sinusitis  Post-op Dx: Same  Proc: Bilateral endoscopic frontal sinusotomy, bilateral endoscopic total ethmoidectomy, bilateral endoscopic maxillary antrostomies with removal of contents, bilateral endoscopic sphenoid sinusotomy, use of image guided system   Surg:  [facility] Heit[facility]rater H  Anes:  GOT  EBL:  250 mL  Comp:  None  Findings:  Polypoid changes throughout all of the sinuses opened on both sides  Procedure: The patient was brought to the operating suite placed in a supine position. General anesthesia was given by oral endotracheal intubation. The the nose was prepped using cottonoid pledgets soaked in phenylephrine and Xylocaine. The image guided system was brought in and the CT scan was downloaded from the disc. The template was applied to the face and registered to the system. There was 0.5 mm of variance. The suction instruments were then registered to the system and checked for alignment, which was perfect.  The 0 scope was used to visualize left side first. The septum was straight and the airway was open all the way back. There was no evidence of significant polypoid tissue in the nose. The middle turbinate was infractured and the side biter was used to incise the uncinate process. The Diego microdebrider was used to remove the rest of the uncinate and help open the maxillary antrum. There were polypoid changes throughout this area. These were all removed. The maxillary open opening was widened and make sure the natural ostium was included. The posterior middle ethmoid air cells were then opened using the [employer]. The image guided system was used to evaluate the depth of dissection. Polypoid changes were throughout all of the sinuses. There is a lot of ooze at first but then this settle down as the polyps  were all cleaned out. The 30 scope was used to open the middle and anterior ethmoid air cells, and then the frontal sinus duct was found. This was widened using the frontal sinus instruments. There were polypoid changes all around the outside of the duct that were cleaned away. The sphenoid sinus was then opened using the 0 scope. There were polypoid changes at the opening to the sphenoid and these were removed. The natural ostium was widened using sphenoid punch until I could see into the sphenoid sinus easily through the posterior ethmoid. This completed opening all the sinuses on the left side. Adenoid pledgets were placed here for vasoconstriction.  The right side was then visualized and opened in a similar fashion.  the 0 scope was used to visualize the nose and the middle turbinate was infractured. The uncinate process was incised with a side biter and then removed with the Interior. The maxillary antrum was opened and the natural ostium widened. There was polypoid disease the anterior superior sinus near the natural ostium that was removed. The rest the sinus was clean. The posterior middle ethmoid air cells were opened using the 0 scope and the image guided system to make sure all the air cells were opened. Each one was filled with polyps and removed. The 30 scope was used to open up the middle and anterior ethmoid air cells. Again there were polypoid changes throughout. Once these were opened the frontal sinus duct was found and had polypoid changes around it. This was trimmed and removed to make sure the frontal sinus duct was open. The image guided system was used  to make sure I can get up into the entire frontal sinus and that it was cleared out. Cottonoid pledget was placed here temporarily as well.  Both sides were revisualized to make sure the sinuses were all open and clear. There was minimal ooze at this time. The propel stent was placed in the ethmoid sinus on both sides and  xerogel was then placed into the inside of the propel stent and liquefy. The inferior airway was open and clear. This was repeated on the opposite side. The patient tolerated the procedure well. She was awakened and taken to the recovery room in satisfactory condition. There were no operative complications.   Dispo:   To PACU and then to be discharged home  Plan:  Follow-up in the office in 6 days for reevaluation. She will rest at home with her head elevated. Start saline flushes tomorrow. We'll use narcotics for pain if necessary. She will start prednisone taper tomorrow and use antibiotics for 1 week.  Gabrielle Terry H  04/29/2015 1:41 PM

## 2015-04-29 NOTE — Anesthesia Postprocedure Evaluation (Signed)
  Anesthesia Post-op Note  Patient: Gabrielle Terry  Procedure(s) Performed: Procedure(s) with comments: IMAGE GUIDED SINUS SURGERY (N/A) - GAVE DISK TO CE CE MAXILLARY ANTROSTOMY WITH REMOVAL OF CONTENTS (Bilateral) FRONTAL SINUS EXPLORATION (Bilateral) TOTAL ETHMOIDECTOMY (Bilateral) SPHENOIDECTOMY (Bilateral)  Anesthesia type:General ETT  Patient location: PACU  Post pain: Pain level controlled  Post assessment: Post-op Vital signs reviewed, Patient's Cardiovascular Status Stable, Respiratory Function Stable, Patent Airway and No signs of Nausea or vomiting  Post vital signs: Reviewed and stable  Last Vitals:  Filed Vitals:   04/29/15 1415  BP: 143/88  Pulse: 80  Temp:   Resp: 9    Level of consciousness: awake, alert  and patient cooperative  Complications: No apparent anesthesia complications

## 2015-04-29 NOTE — Anesthesia Preprocedure Evaluation (Addendum)
Anesthesia Evaluation  Patient identified by MRN, date of birth, ID band Patient awake    Reviewed: Allergy & Precautions, H&P , NPO status , Patient's Chart, lab work & pertinent test results  Airway Mallampati: II  TM Distance: >3 FB Neck ROM: full    Dental no notable dental hx.    Pulmonary asthma ,  Chronic sinusitus- chronic productive cough x 1 year   Pulmonary exam normal        Cardiovascular hypertension, On Medications Normal cardiovascular exam     Neuro/Psych    GI/Hepatic Neg liver ROS, GERD  Medicated and Controlled,  Endo/Other    Renal/GU negative Renal ROS     Musculoskeletal   Abdominal   Peds  Hematology negative hematology ROS (+)   Anesthesia Other Findings   Reproductive/Obstetrics                            Anesthesia Physical Anesthesia Plan  ASA: II  Anesthesia Plan: General ETT   Post-op Pain Management: MAC Combined w/ Regional for Post-op pain   Induction:   Airway Management Planned:   Additional Equipment:   Intra-op Plan:   Post-operative Plan:   Informed Consent: I have reviewed the patients History and Physical, chart, labs and discussed the procedure including the risks, benefits and alternatives for the proposed anesthesia with the patient or authorized representative who has indicated his/her understanding and acceptance.     Plan Discussed with: CRNA  Anesthesia Plan Comments:         Anesthesia Quick Evaluation

## 2015-04-29 NOTE — Transfer of Care (Deleted)
Immediate Anesthesia Transfer of Care Note  Patient: Gabrielle Terry  Procedure(s) Performed: Procedure(s) with comments: IMAGE GUIDED SINUS SURGERY (N/A) - GAVE DISK TO CE CE MAXILLARY ANTROSTOMY WITH REMOVAL OF CONTENTS (Bilateral) FRONTAL SINUS EXPLORATION (Bilateral) TOTAL ETHMOIDECTOMY (Bilateral) SPHENOIDECTOMY (Bilateral)  Patient Location: PACU  Anesthesia Type: General ETT  Level of Consciousness: awake, alert  and patient cooperative  Airway and Oxygen Therapy: Patient Spontanous Breathing and Patient connected to supplemental oxygen  Post-op Assessment: Post-op Vital signs reviewed, Patient's Cardiovascular Status Stable, Respiratory Function Stable, Patent Airway and No signs of Nausea or vomiting  Post-op Vital Signs: Reviewed and stable  Complications: No apparent anesthesia complications

## 2015-04-29 NOTE — Anesthesia Procedure Notes (Addendum)
Procedure Name: Intubation Date/Time: 04/29/2015 11:21 AM Performed by: Londell Moh Pre-anesthesia Checklist: Patient identified, Emergency Drugs available, Suction available, Patient being monitored and Timeout performed Patient Re-evaluated:Patient Re-evaluated prior to inductionOxygen Delivery Method: Circle system utilized Preoxygenation: Pre-oxygenation with 100% oxygen Intubation Type: IV induction Ventilation: Mask ventilation without difficulty Laryngoscope Size: Mac and 3 Grade View: Grade I Tube type: Oral Rae Tube size: 7.0 mm Number of attempts: 1 Placement Confirmation: ETT inserted through vocal cords under direct vision,  positive ETCO2 and breath sounds checked- equal and bilateral Tube secured with: Tape Dental Injury: Teeth and Oropharynx as per pre-operative assessment  Comments: Pt with productive cough. Pt took 2 puff of albuterol prior to going to OR. After AOI suctioned via ett for small amount of thick secretions.

## 2015-04-30 ENCOUNTER — Encounter: Payer: Self-pay | Admitting: Otolaryngology

## 2015-05-03 LAB — SURGICAL PATHOLOGY

## 2015-05-11 ENCOUNTER — Other Ambulatory Visit: Payer: Self-pay | Admitting: Family Medicine

## 2015-05-11 DIAGNOSIS — I10 Essential (primary) hypertension: Secondary | ICD-10-CM

## 2015-06-02 ENCOUNTER — Encounter: Payer: Self-pay | Admitting: Family Medicine

## 2015-09-14 ENCOUNTER — Ambulatory Visit (INDEPENDENT_AMBULATORY_CARE_PROVIDER_SITE_OTHER): Payer: BLUE CROSS/BLUE SHIELD | Admitting: Family Medicine

## 2015-09-14 ENCOUNTER — Encounter: Payer: Self-pay | Admitting: Family Medicine

## 2015-09-14 VITALS — BP 104/62 | HR 89 | Temp 100.4°F | Resp 18 | Wt 219.0 lb

## 2015-09-14 DIAGNOSIS — J4521 Mild intermittent asthma with (acute) exacerbation: Secondary | ICD-10-CM

## 2015-09-14 DIAGNOSIS — J301 Allergic rhinitis due to pollen: Secondary | ICD-10-CM

## 2015-09-14 MED ORDER — AZITHROMYCIN 250 MG PO TABS: ORAL_TABLET | ORAL | Status: DC

## 2015-09-14 MED ORDER — PREDNISONE 20 MG PO TABS: ORAL_TABLET | ORAL | Status: DC

## 2015-09-14 MED ORDER — MONTELUKAST SODIUM 10 MG PO TABS: 10.0000 mg | ORAL_TABLET | Freq: Every day | ORAL | Status: DC

## 2015-09-14 NOTE — Patient Instructions (Signed)
Schedule the albuterol inhaler every 4-6 hours while ill.

## 2015-09-14 NOTE — Progress Notes (Signed)
Subjective:     Patient ID: Gabrielle Terry, female   DOB: 07-02-1961, 55 y.o.   MRN: [id number]  HPI  Chief Complaint  Patient presents with  . Cough    Patient comes in office today with concerns of productive cough and congestion for the past 6 days. Patient reports she has had wheezing and chills, she has tried taking otc Sinus Max, Allegra and Delsym for relief.   States she is compliant with Advair and has started using her rescue inhaler as well. No flu shot this season. Wishes refill on Singulair.   Review of Systems  Constitutional: Negative for fever.       Objective:   Physical Exam  Constitutional: She appears well-developed and well-nourished. She has a sickly appearance. No distress.  Ears: T.M's intact without inflammation Throat: no tonsillar enlargement or exudate Neck: anterior cervical tenderness Lungs: Bilateral expiratory wheezing and coughing     Assessment:    1. Asthmatic bronchitis, mild intermittent, with acute exacerbation - predniSONE (DELTASONE) 20 MG tablet; Taper as follows: 3 pills for 4 days, two pills for 4 days, one pill for four days  Dispense: 24 tablet; Refill: 0 - azithromycin (ZITHROMAX Z-PAK) 250 MG tablet; Take two pills today then one pill daily  Dispense: 6 each; Refill: 0  2. Allergic rhinitis due to pollen - montelukast (SINGULAIR) 10 MG tablet; Take 1 tablet (10 mg total) by mouth at bedtime.  Dispense: 30 tablet; Refill: 11    Plan:    Schedule albuterol inhaler.

## 2015-10-04 ENCOUNTER — Other Ambulatory Visit: Payer: Self-pay | Admitting: Family Medicine

## 2015-10-14 ENCOUNTER — Other Ambulatory Visit: Payer: Self-pay | Admitting: Family Medicine

## 2015-10-14 DIAGNOSIS — R229 Localized swelling, mass and lump, unspecified: Secondary | ICD-10-CM | POA: Insufficient documentation

## 2015-10-14 DIAGNOSIS — N951 Menopausal and female climacteric states: Secondary | ICD-10-CM | POA: Insufficient documentation

## 2015-10-14 DIAGNOSIS — Z332 Encounter for elective termination of pregnancy: Secondary | ICD-10-CM | POA: Insufficient documentation

## 2015-10-14 DIAGNOSIS — E7800 Pure hypercholesterolemia, unspecified: Secondary | ICD-10-CM | POA: Insufficient documentation

## 2015-10-14 DIAGNOSIS — J45909 Unspecified asthma, uncomplicated: Secondary | ICD-10-CM | POA: Insufficient documentation

## 2015-10-14 DIAGNOSIS — E669 Obesity, unspecified: Secondary | ICD-10-CM | POA: Insufficient documentation

## 2015-10-14 DIAGNOSIS — M722 Plantar fascial fibromatosis: Secondary | ICD-10-CM | POA: Insufficient documentation

## 2015-10-14 DIAGNOSIS — K648 Other hemorrhoids: Secondary | ICD-10-CM | POA: Insufficient documentation

## 2015-10-14 DIAGNOSIS — G4700 Insomnia, unspecified: Secondary | ICD-10-CM | POA: Insufficient documentation

## 2015-10-14 DIAGNOSIS — R233 Spontaneous ecchymoses: Secondary | ICD-10-CM | POA: Insufficient documentation

## 2015-10-14 DIAGNOSIS — T6391XA Toxic effect of contact with unspecified venomous animal, accidental (unintentional), initial encounter: Secondary | ICD-10-CM | POA: Insufficient documentation

## 2015-10-14 DIAGNOSIS — R238 Other skin changes: Secondary | ICD-10-CM | POA: Insufficient documentation

## 2015-10-14 DIAGNOSIS — K219 Gastro-esophageal reflux disease without esophagitis: Secondary | ICD-10-CM | POA: Insufficient documentation

## 2015-10-14 MED ORDER — ALBUTEROL SULFATE HFA 108 (90 BASE) MCG/ACT IN AERS: 1.0000 | INHALATION_SPRAY | RESPIRATORY_TRACT | Status: DC | PRN

## 2015-10-14 NOTE — Telephone Encounter (Signed)
Please review, patient has not been seen for follow up since April 2016 with [place], she saw Mikki Santee this year once and last year once for acute visits, no future appointments have been made, Dr. Venia Minks refilled some of her medication-im not sure if she is 100% our patient and just sees who is available. Please review-aa

## 2015-10-14 NOTE — Telephone Encounter (Signed)
Pt needs refill proair inhaler oral inh 200 puffs 8.5gm  [facility] [address]  Thanks, Dentist

## 2015-10-14 NOTE — Telephone Encounter (Signed)
RX sent in and pt advised, appt made-aa

## 2015-10-14 NOTE — Telephone Encounter (Signed)
Three-month refills. Needs appointment with somebody this summer.

## 2015-11-16 ENCOUNTER — Ambulatory Visit (INDEPENDENT_AMBULATORY_CARE_PROVIDER_SITE_OTHER): Payer: BLUE CROSS/BLUE SHIELD | Admitting: Family Medicine

## 2015-11-16 VITALS — BP 122/80 | HR 78 | Temp 98.2°F | Resp 16 | Wt 213.0 lb

## 2015-11-16 DIAGNOSIS — K219 Gastro-esophageal reflux disease without esophagitis: Secondary | ICD-10-CM | POA: Diagnosis not present

## 2015-11-16 DIAGNOSIS — R1084 Generalized abdominal pain: Secondary | ICD-10-CM | POA: Diagnosis not present

## 2015-11-16 MED ORDER — PANTOPRAZOLE SODIUM 40 MG PO TBEC
40.0000 mg | DELAYED_RELEASE_TABLET | Freq: Two times a day (BID) | ORAL | Status: DC
Start: 2015-11-16 — End: 2016-01-24

## 2015-11-16 NOTE — Patient Instructions (Signed)
Alcohol, Caffeine, Mints and Nicotine make acid reflux worse.

## 2015-11-16 NOTE — Progress Notes (Signed)
Patient ID: Gabrielle Terry, female   DOB: 02-27-1961, 55 y.o.   MRN: [id number]   Gabrielle Terry  MRN: [id number] DOB: 1960-12-05  Subjective:  HPI   1. Gastroesophageal reflux disease, esophagitis presence not specified The patient is a 55 year old female who presents for evaluation of worsening acid reflux.  She has been using Ranitidine 150 mg twice daily with insufficient relief.  She states she can not even drink water without having indigestion and pain.  Her symptoms are worse at night.  She states that when she can burp she gets temporary relief.  Patient Active Problem List   Diagnosis Date Noted  . AB (asthmatic bronchitis) 10/14/2015  . Abnormal bruising 10/14/2015  . Toxic effect of venom 10/14/2015  . Airway hyperreactivity 10/14/2015  . Acid reflux 10/14/2015  . Elective abortion 10/14/2015  . Hypercholesteremia 10/14/2015  . Cannot sleep 10/14/2015  . Hemorrhoids, internal 10/14/2015  . Climacteric 10/14/2015  . Localized superficial swelling, mass, or lump 10/14/2015  . Adiposity 10/14/2015  . Plantar fasciitis 10/14/2015  . BP (high blood pressure) 04/07/2015  . Allergic rhinitis 04/07/2015  . Arthritis of knee, degenerative 12/18/2014  . Diffuse cystic mastopathy 01/28/2014  . Personal history of colonic polyps 01/09/2013    Past Medical History  Diagnosis Date  . Hypertension   . Asthma 2009  . Ulcer   . Diffuse cystic mastopathy   . Breast hematoma 2011    Right  . Colon polyp 2013    3  . Hypercholesteremia   . Foot cramps   . Arthritis     knees  . GERD (gastroesophageal reflux disease)   . Cough     from sinus drainage    Social History   Social History  . Marital Status: Single    Spouse Name: N/A  . Number of Children: N/A  . Years of Education: N/A   Occupational History  . Not on file.   Social History Main Topics  . Smoking status: Never Smoker   . Smokeless tobacco: Never Used  . Alcohol Use: 0.6 oz/week    1 Glasses of wine  per week  . Drug Use: No  . Sexual Activity: Not on file   Other Topics Concern  . Not on file   Social History Narrative    Outpatient Prescriptions Prior to Visit  Medication Sig Dispense Refill  . albuterol (PROVENTIL HFA;VENTOLIN HFA) 108 (90 Base) MCG/ACT inhaler Inhale 1-2 puffs into the lungs every 4 (four) hours as needed for wheezing. 1 Inhaler 12  . amLODipine (NORVASC) 2.5 MG tablet TAKE 1 TABLET BY MOUTH EVERY DAY 30 tablet 5  . fluticasone-salmeterol (ADVAIR HFA) 230-21 MCG/ACT inhaler Inhale 2 puffs into the lungs 2 (two) times daily. 1 Inhaler 12  . losartan-hydrochlorothiazide (HYZAAR) 100-25 MG per tablet Take 1 tablet by mouth daily. 30 tablet 6  . lovastatin (MEVACOR) 20 MG tablet TK 1 T PO D HS  6  . MAGNESIUM-OXIDE 400 (241.3 MG) MG tablet TK 1 T PO TWO TIMES D  12  . montelukast (SINGULAIR) 10 MG tablet Take 1 tablet (10 mg total) by mouth at bedtime. 30 tablet 11  . polyethylene glycol (MIRALAX / GLYCOLAX) packet Take 17 g by mouth daily.    . ranitidine (ZANTAC) 150 MG tablet TAKE 1 TABLET BY MOUTH TWICE DAILY 60 tablet 6  . azithromycin (ZITHROMAX Z-PAK) 250 MG tablet Take two pills today then one pill daily 6 each 0  . predniSONE (DELTASONE)  20 MG tablet Taper as follows: 3 pills for 4 days, two pills for 4 days, one pill for four days 24 tablet 0  . ranitidine (ZANTAC) 150 MG capsule Take 150 mg by mouth daily.     No facility-administered medications prior to visit.    No Known Allergies  Review of Systems  Constitutional: Negative for fever and malaise/fatigue.  Eyes: Negative.   Respiratory: Negative for cough, shortness of breath and wheezing.   Cardiovascular: Negative for chest pain, palpitations, orthopnea, claudication, leg swelling and PND.  Gastrointestinal: Positive for heartburn. Negative for nausea, vomiting, abdominal pain, diarrhea, constipation and blood in stool.  Neurological: Negative for dizziness, weakness and headaches.    Endo/Heme/Allergies: Negative.   Psychiatric/Behavioral: Negative.    Objective:  BP 122/80 mmHg  Pulse 78  Temp(Src) 98.2 F (36.8 C) (Oral)  Resp 16  Wt 213 lb (96.616 kg)  Physical Exam  Constitutional: She is oriented to person, place, and time and well-developed, well-nourished, and in no distress.  HENT:  Head: Normocephalic and atraumatic.  Right Ear: External ear normal.  Left Ear: External ear normal.  Nose: Nose normal.  Eyes: Conjunctivae are normal. Pupils are equal, round, and reactive to light.  Neck: Normal range of motion. Neck supple.  Cardiovascular: Normal rate, regular rhythm and normal heart sounds.   Pulmonary/Chest: Effort normal and breath sounds normal.  Abdominal: Soft.  Neurological: She is alert and oriented to person, place, and time. Gait normal.  Skin: Skin is warm and dry.  Psychiatric: Mood, memory, affect and judgment normal.    Assessment and Plan :   1. Gastroesophageal reflux disease, esophagitis presence not specified Elevate head of bed. - pantoprazole (PROTONIX) 40 MG tablet; Take 1 tablet (40 mg total) by mouth 2 (two) times daily.  Dispense: 60 tablet; Refill: 3 - CBC with Differential/Platelet - Comprehensive metabolic panel - Lipase - H. pylori antibody, IgG  2. Generalized abdominal pain  - CBC with Differential/Platelet - Comprehensive metabolic panel - Lipase - H. pylori antibody, IgG  I have done the exam and reviewed the above chart and it is accurate to the best of my knowledge.  Miguel Aschoff MD [facility] 11/16/2015 2:03 PM

## 2015-11-17 ENCOUNTER — Telehealth: Payer: Self-pay

## 2015-11-17 LAB — COMPREHENSIVE METABOLIC PANEL
A/G RATIO: 1.7 (ref 1.2–2.2)
ALBUMIN: 4.6 g/dL (ref 3.5–5.5)
ALT: 20 IU/L (ref 0–32)
AST: 19 IU/L (ref 0–40)
Alkaline Phosphatase: 78 IU/L (ref 39–117)
BILIRUBIN TOTAL: 0.5 mg/dL (ref 0.0–1.2)
BUN / CREAT RATIO: 21 (ref 9–23)
BUN: 17 mg/dL (ref 6–24)
CALCIUM: 10.2 mg/dL (ref 8.7–10.2)
CHLORIDE: 97 mmol/L (ref 96–106)
CO2: 25 mmol/L (ref 18–29)
Creatinine, Ser: 0.82 mg/dL (ref 0.57–1.00)
GFR, EST AFRICAN AMERICAN: 94 mL/min/{1.73_m2} (ref >59)
GFR, EST NON AFRICAN AMERICAN: 81 mL/min/{1.73_m2} (ref >59)
Globulin, Total: 2.7 g/dL (ref 1.5–4.5)
Glucose: 102 mg/dL — ABNORMAL HIGH (ref 65–99)
POTASSIUM: 4.2 mmol/L (ref 3.5–5.2)
Sodium: 141 mmol/L (ref 134–144)
TOTAL PROTEIN: 7.3 g/dL (ref 6.0–8.5)

## 2015-11-17 LAB — CBC WITH DIFFERENTIAL/PLATELET
BASOS: 0 %
Basophils Absolute: 0 10*3/uL (ref 0.0–0.2)
EOS (ABSOLUTE): 0.7 10*3/uL — ABNORMAL HIGH (ref 0.0–0.4)
EOS: 9 %
HEMOGLOBIN: 14 g/dL (ref 11.1–15.9)
Hematocrit: 41.4 % (ref 34.0–46.6)
IMMATURE GRANS (ABS): 0 10*3/uL (ref 0.0–0.1)
IMMATURE GRANULOCYTES: 0 %
LYMPHS: 31 %
Lymphocytes Absolute: 2.3 10*3/uL (ref 0.7–3.1)
MCH: 27.8 pg (ref 26.6–33.0)
MCHC: 33.8 g/dL (ref 31.5–35.7)
MCV: 82 fL (ref 79–97)
MONOCYTES: 3 %
Monocytes Absolute: 0.3 10*3/uL (ref 0.1–0.9)
NEUTROS ABS: 4.3 10*3/uL (ref 1.4–7.0)
NEUTROS PCT: 57 %
Platelets: 298 10*3/uL (ref 150–379)
RBC: 5.03 x10E6/uL (ref 3.77–5.28)
RDW: 15.5 % — ABNORMAL HIGH (ref 12.3–15.4)
WBC: 7.6 10*3/uL (ref 3.4–10.8)

## 2015-11-17 LAB — LIPASE: LIPASE: 31 U/L (ref 0–59)

## 2015-11-17 LAB — H. PYLORI ANTIBODY, IGG

## 2015-11-17 NOTE — Telephone Encounter (Signed)
LMTCB

## 2015-11-17 NOTE — Telephone Encounter (Signed)
Pt informed and voiced understanding of results. 

## 2015-11-17 NOTE — Telephone Encounter (Signed)
-----   Message from Jerrol Banana., MD sent at 11/17/2015  8:11 AM EDT ----- Labs normal.

## 2015-11-26 ENCOUNTER — Other Ambulatory Visit: Payer: Self-pay | Admitting: Family Medicine

## 2015-12-07 ENCOUNTER — Ambulatory Visit (INDEPENDENT_AMBULATORY_CARE_PROVIDER_SITE_OTHER): Payer: BLUE CROSS/BLUE SHIELD | Admitting: Family Medicine

## 2015-12-07 VITALS — BP 104/68 | HR 76 | Temp 98.5°F | Resp 12 | Wt 211.0 lb

## 2015-12-07 DIAGNOSIS — R1310 Dysphagia, unspecified: Secondary | ICD-10-CM

## 2015-12-07 DIAGNOSIS — K219 Gastro-esophageal reflux disease without esophagitis: Secondary | ICD-10-CM | POA: Diagnosis not present

## 2015-12-07 DIAGNOSIS — R1084 Generalized abdominal pain: Secondary | ICD-10-CM

## 2015-12-07 MED ORDER — SUCRALFATE 1 G PO TABS: 1.0000 g | ORAL_TABLET | Freq: Three times a day (TID) | ORAL | Status: DC

## 2015-12-07 MED ORDER — ALBUTEROL SULFATE HFA 108 (90 BASE) MCG/ACT IN AERS: 1.0000 | INHALATION_SPRAY | RESPIRATORY_TRACT | Status: DC | PRN

## 2015-12-07 NOTE — Progress Notes (Signed)
Patient ID: Gabrielle Terry, female   DOB: Apr 09, 1961, 55 y.o.   MRN: [id number]    Subjective:  HPI  Patient is here for follow up, last visit was April 26th. We switched Zantac to Protonix and patient states symptoms are the same. She is still having burning sensation in her throat and has hard time swallowing. She has not had any swallow studies done before. She has not been referred to ENT or GI yet.  Prior to Admission medications   Medication Sig Start Date End Date Taking? Authorizing Provider  albuterol (PROVENTIL HFA;VENTOLIN HFA) 108 (90 Base) MCG/ACT inhaler Inhale 1-2 puffs into the lungs every 4 (four) hours as needed for wheezing. 10/14/15   Jerrol Banana., MD  amLODipine (NORVASC) 2.5 MG tablet TAKE 1 TABLET BY MOUTH EVERY DAY 11/26/15   Jerrol Banana., MD  fluticasone-salmeterol (ADVAIR HFA) [id number] MCG/ACT inhaler Inhale 2 puffs into the lungs 2 (two) times daily. 02/01/15   Carmon Ginsberg, PA  losartan-hydrochlorothiazide (HYZAAR) 100-25 MG per tablet Take 1 tablet by mouth daily. 04/12/15   Richard Maceo Pro., MD  lovastatin (MEVACOR) 20 MG tablet TK 1 T PO D HS 11/18/14   Historical Provider, MD  MAGNESIUM-OXIDE 400 (241.3 MG) MG tablet TK 1 T PO TWO TIMES D 12/22/14   Historical Provider, MD  montelukast (SINGULAIR) 10 MG tablet Take 1 tablet (10 mg total) by mouth at bedtime. 09/14/15   Carmon Ginsberg, PA  pantoprazole (PROTONIX) 40 MG tablet Take 1 tablet (40 mg total) by mouth 2 (two) times daily. 11/16/15   Richard Maceo Pro., MD  polyethylene glycol [facility] Flor[facility]aveling) packet Take 17 g by mouth daily.    Historical Provider, MD  ranitidine (ZANTAC) 150 MG tablet TAKE 1 TABLET BY MOUTH TWICE DAILY 10/05/15   Jerrol Banana., MD    Patient Active Problem List   Diagnosis Date Noted  . AB (asthmatic bronchitis) 10/14/2015  . Abnormal bruising 10/14/2015  . Toxic effect of venom 10/14/2015  . Airway hyperreactivity 10/14/2015  . Acid reflux 10/14/2015    . Elective abortion 10/14/2015  . Hypercholesteremia 10/14/2015  . Cannot sleep 10/14/2015  . Hemorrhoids, internal 10/14/2015  . Climacteric 10/14/2015  . Localized superficial swelling, mass, or lump 10/14/2015  . Adiposity 10/14/2015  . Plantar fasciitis 10/14/2015  . BP (high blood pressure) 04/07/2015  . Allergic rhinitis 04/07/2015  . Arthritis of knee, degenerative 12/18/2014  . Diffuse cystic mastopathy 01/28/2014  . Personal history of colonic polyps 01/09/2013    Past Medical History  Diagnosis Date  . Hypertension   . Asthma 2009  . Ulcer   . Diffuse cystic mastopathy   . Breast hematoma 2011    Right  . Colon polyp 2013    3  . Hypercholesteremia   . Foot cramps   . Arthritis     knees  . GERD (gastroesophageal reflux disease)   . Cough     from sinus drainage    Social History   Social History  . Marital Status: Single    Spouse Name: N/A  . Number of Children: N/A  . Years of Education: N/A   Occupational History  . Not on file.   Social History Main Topics  . Smoking status: Never Smoker   . Smokeless tobacco: Never Used  . Alcohol Use: 0.6 oz/week    1 Glasses of wine per week  . Drug Use: No  . Sexual Activity: Not on file  Other Topics Concern  . Not on file   Social History Narrative    No Known Allergies  Review of Systems  Constitutional: Negative.   Respiratory: Positive for cough.   Cardiovascular: Negative.   Gastrointestinal: Positive for heartburn. Negative for nausea, vomiting and abdominal pain.       Dysphagia    Immunization History  Administered Date(s) Administered  . Tdap 08/27/2012   Objective:  BP 104/68 mmHg  Pulse 76  Temp(Src) 98.5 F (36.9 C)  Resp 12  Wt 211 lb (95.709 kg)  Physical Exam  Constitutional: She is oriented to person, place, and time and well-developed, well-nourished, and in no distress.  HENT:  Head: Atraumatic.  Eyes: Conjunctivae are normal. Pupils are equal, round, and  reactive to light.  Neck: Normal range of motion. Neck supple.  Cardiovascular: Normal rate, regular rhythm, normal heart sounds and intact distal pulses.   No murmur heard. Pulmonary/Chest: Effort normal and breath sounds normal.  Abdominal: Soft. She exhibits no distension and no mass. There is no tenderness. There is no rebound and no guarding.  Musculoskeletal: She exhibits no edema or tenderness.  Neurological: She is alert and oriented to person, place, and time.  Psychiatric: Mood, memory, affect and judgment normal.    Lab Results  Component Value Date   WBC 7.6 11/16/2015   HGB 13.3 10/27/2014   HCT 41.4 11/16/2015   PLT 298 11/16/2015   GLUCOSE 102* 11/16/2015   CHOL 210* 09/30/2013   TRIG 47 09/30/2013   HDL 46 09/30/2013   LDLCALC 155 09/30/2013   TSH 2.85 09/30/2013    CMP     Component Value Date/Time   NA 141 11/16/2015 1442   K 4.2 11/16/2015 1442   CL 97 11/16/2015 1442   CO2 25 11/16/2015 1442   GLUCOSE 102* 11/16/2015 1442   BUN 17 11/16/2015 1442   CREATININE 0.82 11/16/2015 1442   CREATININE 0.8 09/30/2013   CALCIUM 10.2 11/16/2015 1442   PROT 7.3 11/16/2015 1442   ALBUMIN 4.6 11/16/2015 1442   AST 19 11/16/2015 1442   ALT 20 11/16/2015 1442   ALKPHOS 78 11/16/2015 1442   BILITOT 0.5 11/16/2015 1442   GFRNONAA 81 11/16/2015 1442   GFRAA 94 11/16/2015 1442    Assessment and Plan :  1. Gastroesophageal reflux disease, esophagitis presence not specified Not better. Will add Carafate and refer to GI.  If GI work up is negative/normal may consider add Reglin but rather not go that route due to potential side effects. - sucralfate (CARAFATE) 1 g tablet; Take 1 tablet (1 g total) by mouth 4 (four) times daily -  with meals and at bedtime.  Dispense: 120 tablet; Refill: 1 - Ambulatory referral to Gastroenterology  2. Dysphagia See above plan. - sucralfate (CARAFATE) 1 g tablet; Take 1 tablet (1 g total) by mouth 4 (four) times daily -  with meals  and at bedtime.  Dispense: 120 tablet; Refill: 1 - Ambulatory referral to Gastroenterology  3. Generalized abdominal pain Improved. I have done the exam and reviewed the above chart and it is accurate to the best of my knowledge.  Patient was seen and examined by Dr. Eulas Post and note was scribed by Theressa Millard, RMA.   Miguel Aschoff MD [facility] 12/07/2015 4:07 PM

## 2015-12-21 DIAGNOSIS — R1310 Dysphagia, unspecified: Secondary | ICD-10-CM | POA: Diagnosis not present

## 2015-12-21 DIAGNOSIS — K219 Gastro-esophageal reflux disease without esophagitis: Secondary | ICD-10-CM | POA: Diagnosis not present

## 2015-12-29 ENCOUNTER — Other Ambulatory Visit: Payer: Self-pay | Admitting: Family Medicine

## 2016-01-03 ENCOUNTER — Ambulatory Visit: Payer: BLUE CROSS/BLUE SHIELD | Admitting: Family Medicine

## 2016-01-03 VITALS — BP 128/72 | HR 64 | Resp 16 | Wt 213.0 lb

## 2016-01-03 DIAGNOSIS — K219 Gastro-esophageal reflux disease without esophagitis: Secondary | ICD-10-CM | POA: Diagnosis not present

## 2016-01-03 NOTE — Progress Notes (Signed)
Patient ID: Gabrielle Terry, female   DOB: [birth year], 55 y.o.   MRN: [id number]   IRIDIAN HEINZMAN  MRN: [id number] DOB: Dec 08, 1960  Subjective:  HPI   The patient is a 55 year old female who presents for follow up of her acid reflux.  She was last seen on 12/07/15.  At that time she was started on Carafate and given a GI referral.  She has seen the GI PA and was given Ranitidine 150 mg to take BID and she has Endoscopy scheduled on 01/26/16.   She continues to have discomfort and difficulty eating anything without it feeling like it is stuck in her throat.  She states when this happens she has to make herself burp and that will help temporarily. Mother of 2 doing well. Patient Active Problem List   Diagnosis Date Noted  . AB (asthmatic bronchitis) 10/14/2015  . Abnormal bruising 10/14/2015  . Toxic effect of venom 10/14/2015  . Airway hyperreactivity 10/14/2015  . Acid reflux 10/14/2015  . Elective abortion 10/14/2015  . Hypercholesteremia 10/14/2015  . Cannot sleep 10/14/2015  . Hemorrhoids, internal 10/14/2015  . Climacteric 10/14/2015  . Localized superficial swelling, mass, or lump 10/14/2015  . Adiposity 10/14/2015  . Plantar fasciitis 10/14/2015  . BP (high blood pressure) 04/07/2015  . Allergic rhinitis 04/07/2015  . Arthritis of knee, degenerative 12/18/2014  . Diffuse cystic mastopathy 01/28/2014  . Personal history of colonic polyps 01/09/2013    Past Medical History  Diagnosis Date  . Hypertension   . Asthma 2009  . Ulcer   . Diffuse cystic mastopathy   . Breast hematoma 2011    Right  . Colon polyp 2013    3  . Hypercholesteremia   . Foot cramps   . Arthritis     knees  . GERD (gastroesophageal reflux disease)   . Cough     from sinus drainage    Social History   Social History  . Marital Status: Single    Spouse Name: N/A  . Number of Children: N/A  . Years of Education: N/A   Occupational History  . Not on file.   Social History Main Topics  .  Smoking status: Never Smoker   . Smokeless tobacco: Never Used  . Alcohol Use: 0.6 oz/week    1 Glasses of wine per week  . Drug Use: No  . Sexual Activity: Not on file   Other Topics Concern  . Not on file   Social History Narrative    Outpatient Prescriptions Prior to Visit  Medication Sig Dispense Refill  . albuterol (PROVENTIL HFA;VENTOLIN HFA) 108 (90 Base) MCG/ACT inhaler Inhale 1-2 puffs into the lungs every 4 (four) hours as needed for wheezing. 1 Inhaler 12  . amLODipine (NORVASC) 2.5 MG tablet TAKE 1 TABLET BY MOUTH EVERY DAY 30 tablet 12  . fluticasone-salmeterol (ADVAIR HFA) 230-21 MCG/ACT inhaler Inhale 2 puffs into the lungs 2 (two) times daily. 1 Inhaler 12  . losartan-hydrochlorothiazide (HYZAAR) 100-25 MG tablet TAKE 1 TABLET BY MOUTH DAILY 30 tablet 12  . lovastatin (MEVACOR) 20 MG tablet TK 1 T PO D HS  6  . MAGNESIUM-OXIDE 400 (241.3 MG) MG tablet TK 1 T PO TWO TIMES D  12  . montelukast (SINGULAIR) 10 MG tablet Take 1 tablet (10 mg total) by mouth at bedtime. 30 tablet 11  . pantoprazole (PROTONIX) 40 MG tablet Take 1 tablet (40 mg total) by mouth 2 (two) times daily. 60 tablet 3  .  polyethylene glycol (MIRALAX / GLYCOLAX) packet Take 17 g by mouth daily.    . sucralfate (CARAFATE) 1 g tablet Take 1 tablet (1 g total) by mouth 4 (four) times daily -  with meals and at bedtime. 120 tablet 1   No facility-administered medications prior to visit.    No Known Allergies  Review of Systems  Constitutional: Negative for fever and malaise/fatigue.  Respiratory: Positive for cough and wheezing. Negative for shortness of breath.   Cardiovascular: Negative for chest pain, palpitations, orthopnea, claudication, leg swelling and PND.  Gastrointestinal: Positive for heartburn. Negative for nausea, vomiting, abdominal pain, diarrhea, constipation, blood in stool and melena.  Neurological: Negative for dizziness, weakness and headaches.   Objective:  BP 128/72 mmHg  Pulse  64  Resp 16  Wt 213 lb (96.616 kg)  Physical Exam  Constitutional: She is oriented to person, place, and time and well-developed, well-nourished, and in no distress.  HENT:  Head: Normocephalic.  Mouth/Throat: Oropharynx is clear and moist.  Eyes: Pupils are equal, round, and reactive to light.  Neck: Normal range of motion. Neck supple.  Cardiovascular: Normal rate, regular rhythm and normal heart sounds.   Pulmonary/Chest: Effort normal and breath sounds normal.  Abdominal: Soft. Bowel sounds are normal. There is no tenderness. There is no guarding.  Neurological: She is alert and oriented to person, place, and time. Gait normal.  Skin: Skin is warm and dry.  Psychiatric: Mood, memory, affect and judgment normal.    Assessment and Plan :   GERD EGD pending.continue meds. Asthma AR RTC 6 months   Miguel Aschoff MD [facility] 01/03/2016 4:15 PM

## 2016-01-26 ENCOUNTER — Ambulatory Visit
Admission: RE | Admit: 2016-01-26 | Discharge: 2016-01-26 | Disposition: A | Discharge status: Home / Self Care | Payer: BLUE CROSS/BLUE SHIELD | Source: Ambulatory Visit | Attending: Unknown Physician Specialty | Admitting: Unknown Physician Specialty

## 2016-01-26 ENCOUNTER — Ambulatory Visit: Payer: BLUE CROSS/BLUE SHIELD | Admitting: Anesthesiology

## 2016-01-26 ENCOUNTER — Encounter
Admission: RE | Disposition: A | Discharge status: Home / Self Care | Payer: Self-pay | Source: Ambulatory Visit | Attending: Unknown Physician Specialty

## 2016-01-26 DIAGNOSIS — M170 Bilateral primary osteoarthritis of knee: Secondary | ICD-10-CM | POA: Diagnosis not present

## 2016-01-26 DIAGNOSIS — K209 Esophagitis, unspecified: Secondary | ICD-10-CM | POA: Diagnosis not present

## 2016-01-26 DIAGNOSIS — E7800 Pure hypercholesterolemia, unspecified: Secondary | ICD-10-CM | POA: Insufficient documentation

## 2016-01-26 DIAGNOSIS — Z8249 Family history of ischemic heart disease and other diseases of the circulatory system: Secondary | ICD-10-CM | POA: Diagnosis not present

## 2016-01-26 DIAGNOSIS — K3189 Other diseases of stomach and duodenum: Secondary | ICD-10-CM | POA: Insufficient documentation

## 2016-01-26 DIAGNOSIS — Z833 Family history of diabetes mellitus: Secondary | ICD-10-CM | POA: Insufficient documentation

## 2016-01-26 DIAGNOSIS — Z90710 Acquired absence of both cervix and uterus: Secondary | ICD-10-CM | POA: Diagnosis not present

## 2016-01-26 DIAGNOSIS — I10 Essential (primary) hypertension: Secondary | ICD-10-CM | POA: Diagnosis not present

## 2016-01-26 DIAGNOSIS — K210 Gastro-esophageal reflux disease with esophagitis: Secondary | ICD-10-CM | POA: Insufficient documentation

## 2016-01-26 DIAGNOSIS — R1310 Dysphagia, unspecified: Secondary | ICD-10-CM | POA: Diagnosis not present

## 2016-01-26 DIAGNOSIS — J45909 Unspecified asthma, uncomplicated: Secondary | ICD-10-CM | POA: Insufficient documentation

## 2016-01-26 DIAGNOSIS — Z79899 Other long term (current) drug therapy: Secondary | ICD-10-CM | POA: Insufficient documentation

## 2016-01-26 DIAGNOSIS — Z9889 Other specified postprocedural states: Secondary | ICD-10-CM | POA: Diagnosis not present

## 2016-01-26 DIAGNOSIS — Z8349 Family history of other endocrine, nutritional and metabolic diseases: Secondary | ICD-10-CM | POA: Diagnosis not present

## 2016-01-26 DIAGNOSIS — K2950 Unspecified chronic gastritis without bleeding: Secondary | ICD-10-CM | POA: Diagnosis not present

## 2016-01-26 DIAGNOSIS — K2960 Other gastritis without bleeding: Secondary | ICD-10-CM | POA: Diagnosis not present

## 2016-01-26 DIAGNOSIS — K2970 Gastritis, unspecified, without bleeding: Secondary | ICD-10-CM | POA: Diagnosis not present

## 2016-01-26 HISTORY — PX: ESOPHAGOGASTRODUODENOSCOPY (EGD) WITH PROPOFOL: SHX5813

## 2016-01-26 SURGERY — ESOPHAGOGASTRODUODENOSCOPY (EGD) WITH PROPOFOL
Anesthesia: General

## 2016-01-26 MED ORDER — LIDOCAINE HCL (CARDIAC) 20 MG/ML IV SOLN
INTRAVENOUS | Status: DC | PRN
  Administered 2016-01-26: 30 mg via INTRAVENOUS

## 2016-01-26 MED ORDER — PROPOFOL 500 MG/50ML IV EMUL
INTRAVENOUS | Status: DC | PRN
  Administered 2016-01-26: 120 ug/kg/min via INTRAVENOUS

## 2016-01-26 MED ORDER — SODIUM CHLORIDE 0.9 % IV SOLN
INTRAVENOUS | Status: DC
Start: 2016-01-26 — End: 2016-01-26
  Administered 2016-01-26: 1000 mL via INTRAVENOUS

## 2016-01-26 MED ORDER — MIDAZOLAM HCL 2 MG/2ML IJ SOLN
INTRAMUSCULAR | Status: DC | PRN
  Administered 2016-01-26: 1 mg via INTRAVENOUS

## 2016-01-26 MED ORDER — SODIUM CHLORIDE 0.9 % IV SOLN: INTRAVENOUS | Status: DC

## 2016-01-26 MED ORDER — FENTANYL CITRATE (PF) 100 MCG/2ML IJ SOLN
INTRAMUSCULAR | Status: DC | PRN
  Administered 2016-01-26: 50 ug via INTRAVENOUS

## 2016-01-26 NOTE — H&P (Signed)
Primary Care Physician:  Wilhemena Durie, MD Primary Gastroenterologist:  Dr. Vira Agar  Pre-Procedure History & Physical: HPI:  Gabrielle Terry is a 55 y.o. female is here for an endoscopy.   Past Medical History  Diagnosis Date  . Hypertension   . Asthma 2009  . Ulcer   . Diffuse cystic mastopathy   . Breast hematoma 2011    Right  . Colon polyp 2013    3  . Hypercholesteremia   . Foot cramps   . Arthritis     knees  . GERD (gastroesophageal reflux disease)   . Cough     from sinus drainage    Past Surgical History  Procedure Laterality Date  . Cesarean section  1985, 1992  . Salpingo oophorectmy  1997  . Abdominal hysterectomy  1997  . Breast surgery Right 2011    hematoma  . Myomectomy  1991  . Nasal septum surgery  2013  . Colonoscopy  2013    3 polyps/ Dr Jamal Collin  . Image guided sinus surgery N/A 04/29/2015    Procedure: IMAGE GUIDED SINUS SURGERY;  Surgeon: Margaretha Sheffield, MD;  Location: [place];  Service: ENT;  Laterality: N/A;  GAVE DISK TO CE CE  . Maxillary antrostomy Bilateral 04/29/2015    Procedure: MAXILLARY ANTROSTOMY WITH REMOVAL OF CONTENTS;  Surgeon: Margaretha Sheffield, MD;  Location: [place];  Service: ENT;  Laterality: Bilateral;  . Frontal sinus exploration Bilateral 04/29/2015    Procedure: FRONTAL SINUS EXPLORATION;  Surgeon: Margaretha Sheffield, MD;  Location: [place];  Service: ENT;  Laterality: Bilateral;  . Ethmoidectomy Bilateral 04/29/2015    Procedure: TOTAL ETHMOIDECTOMY;  Surgeon: Margaretha Sheffield, MD;  Location: [place];  Service: ENT;  Laterality: Bilateral;  . Sphenoidectomy Bilateral 04/29/2015    Procedure: Coralee Pesa;  Surgeon: Margaretha Sheffield, MD;  Location: [place];  Service: ENT;  Laterality: Bilateral;  . Tubal ligation    . Uterine fibroid surgery      removed    Prior to Admission medications   Medication Sig Start Date End Date Taking? Authorizing Provider  albuterol (PROVENTIL  HFA;VENTOLIN HFA) 108 (90 Base) MCG/ACT inhaler Inhale 1-2 puffs into the lungs every 4 (four) hours as needed for wheezing. 12/07/15  Yes Richard Maceo Pro., MD  amLODipine (NORVASC) 2.5 MG tablet TAKE 1 TABLET BY MOUTH EVERY DAY 11/26/15  Yes Jerrol Banana., MD  losartan-hydrochlorothiazide [facility]) 100-25 MG tablet TAKE 1 TABLET BY MOUTH DAILY 12/29/15  Yes Jerrol Banana., MD  lovastatin (MEVACOR) 20 MG tablet TK 1 T PO D HS 11/18/14  Yes Historical Provider, MD  montelukast (SINGULAIR) 10 MG tablet Take 1 tablet (10 mg total) by mouth at bedtime. 09/14/15  Yes Carmon Ginsberg, PA  polyethylene glycol (MIRALAX / GLYCOLAX) packet Take 17 g by mouth daily.   Yes Historical Provider, MD  sucralfate (CARAFATE) 1 g tablet Take 1 tablet (1 g total) by mouth 4 (four) times daily -  with meals and at bedtime. 12/07/15  Yes Richard Maceo Pro., MD    Allergies as of 12/31/2015  . (No Known Allergies)    Family History  Problem Relation Age of Onset  . Adopted: Yes  . Congestive Heart Failure Father   . Cancer Brother   . Other Brother     brain tumor  . Hypertension Maternal Aunt   . Thyroid disease Maternal Aunt   . Diabetes Maternal Grandmother  Social History   Social History  . Marital Status: Single    Spouse Name: N/A  . Number of Children: N/A  . Years of Education: N/A   Occupational History  . Not on file.   Social History Main Topics  . Smoking status: Never Smoker   . Smokeless tobacco: Never Used  . Alcohol Use: 0.6 oz/week    1 Glasses of wine per week  . Drug Use: No  . Sexual Activity: Not on file   Other Topics Concern  . Not on file   Social History Narrative    Review of Systems: See HPI, otherwise negative ROS  Physical Exam: BP 128/83 mmHg  Pulse 75  Temp(Src) 98.3 F (36.8 C) (Tympanic)  Resp 16  Ht 5\' 5"  (1.651 m)  Wt 92.08 kg (203 lb)  BMI 33.78 kg/m2  SpO2 96% General:   Alert,  pleasant and cooperative in NAD Head:   Normocephalic and atraumatic. Neck:  Supple; no masses or thyromegaly. Lungs:  Clear throughout to auscultation.    Heart:  Regular rate and rhythm. Abdomen:  Soft, nontender and nondistended. Normal bowel sounds, without guarding, and without rebound.   Neurologic:  Alert and  oriented x4;  grossly normal neurologically.  Impression/Plan: [name] is here for an endoscopy to be performed for dysphagia  Risks, benefits, limitations, and alternatives regarding  endoscopy have been reviewed with the patient.  Questions have been answered.  All parties agreeable.   Gaylyn Cheers, MD  01/26/2016, 11:01 AM

## 2016-01-26 NOTE — Op Note (Signed)
[facility] Gastroenterology Patient Name: Gabrielle Terry Procedure Date: 01/26/2016 11:04 AM MRN: [id number] Account #: [id number] Date of Birth: 1960-10-07 Admit Type: Outpatient Age: 55 Room: [facility] ENDO ROOM 4 Gender: Female Note Status: Finalized Procedure:            Upper GI endoscopy Indications:          Dysphagia, Heartburn Providers:            Manya Silvas, MD Referring MD:         Janine Ores. Rosanna Randy, MD (Referring MD) Medicines:            Propofol per Anesthesia Complications:        No immediate complications. Procedure:            Pre-Anesthesia Assessment:                       - After reviewing the risks and benefits, the patient                        was deemed in satisfactory condition to undergo the                        procedure.                       After obtaining informed consent, the endoscope was                        passed under direct vision. Throughout the procedure,                        the patient's blood pressure, pulse, and oxygen                        saturations were monitored continuously. The Endoscope                        was introduced through the mouth, and advanced to the                        second part of duodenum. The upper GI endoscopy was                        accomplished without difficulty. The patient tolerated                        the procedure well. Findings:      LA Grade A (one or more mucosal breaks less than 5 mm, not extending       between tops of 2 mucosal folds) esophagitis with no bleeding was found       40 cm from the incisors. AT the GEJ, cardia area. Biopsies were taken       with a cold forceps for histology. At the end of the procedure a guide       wire was passed and scope withdrawn and a 17F Savary dilator passed       without difficulty.      Localized mild inflammation characterized by erythema and granularity       was found in the gastric antrum. Biopsies were taken with a  cold forceps  for histology. Biopsies were taken with a cold forceps for Helicobacter       pylori testing.      The examined duodenum was normal. Impression:           - LA Grade A reflux esophagitis. Biopsied.                       - Gastritis. Biopsied.                       - Normal examined duodenum. Recommendation:       - Await pathology results. - soft food for 3 days, eat                        slowly, chew well, take small bites. Take medicicnes as                        directed. Manya Silvas, MD 01/26/2016 11:22:02 AM This report has been signed electronically. Number of Addenda: 0 Note Initiated On: 01/26/2016 11:04 AM      [facility]

## 2016-01-26 NOTE — Anesthesia Preprocedure Evaluation (Signed)
Anesthesia Evaluation  Patient identified by MRN, date of birth, ID band Patient awake    Reviewed: Allergy & Precautions, NPO status , Patient's Chart, lab work & pertinent test results  History of Anesthesia Complications Negative for: history of anesthetic complications  Airway Mallampati: II       Dental   Pulmonary neg pulmonary ROS, asthma ,           Cardiovascular hypertension, Pt. on medications      Neuro/Psych negative neurological ROS     GI/Hepatic Neg liver ROS, GERD  Medicated and Poorly Controlled,  Endo/Other  negative endocrine ROS  Renal/GU negative Renal ROS     Musculoskeletal   Abdominal   Peds  Hematology negative hematology ROS (+)   Anesthesia Other Findings   Reproductive/Obstetrics                             Anesthesia Physical Anesthesia Plan  ASA: II  Anesthesia Plan: General   Post-op Pain Management:    Induction: Intravenous  Airway Management Planned: Nasal Cannula  Additional Equipment:   Intra-op Plan:   Post-operative Plan:   Informed Consent: I have reviewed the patients History and Physical, chart, labs and discussed the procedure including the risks, benefits and alternatives for the proposed anesthesia with the patient or authorized representative who has indicated his/her understanding and acceptance.     Plan Discussed with:   Anesthesia Plan Comments:         Anesthesia Quick Evaluation

## 2016-01-26 NOTE — Anesthesia Procedure Notes (Signed)
Performed by: Vaughan Sine Pre-anesthesia Checklist: Emergency Drugs available, Patient identified, Suction available, Patient being monitored and Timeout performed Patient Re-evaluated:Patient Re-evaluated prior to inductionOxygen Delivery Method: Nasal cannula Preoxygenation: Pre-oxygenation with 100% oxygen Intubation Type: IV induction Airway Equipment and Method: Bite block Placement Confirmation: CO2 detector and positive ETCO2

## 2016-01-26 NOTE — Anesthesia Postprocedure Evaluation (Signed)
Anesthesia Post Note  Patient: Gabrielle Terry  Procedure(s) Performed: Procedure(s) (LRB): ESOPHAGOGASTRODUODENOSCOPY (EGD) WITH PROPOFOL (N/A)  Patient location during evaluation: Endoscopy Anesthesia Type: General Level of consciousness: awake and alert Pain management: pain level controlled Vital Signs Assessment: post-procedure vital signs reviewed and stable Respiratory status: spontaneous breathing and respiratory function stable Cardiovascular status: stable Anesthetic complications: no    Last Vitals:  Filed Vitals:   01/26/16 0947 01/26/16 1126  BP: 128/83   Pulse: 75   Temp: 36.8 C 36.4 C  Resp: 16     Last Pain:  Filed Vitals:   01/26/16 1133  PainSc: Asleep                 Erabella Kuipers K

## 2016-01-26 NOTE — Transfer of Care (Signed)
Immediate Anesthesia Transfer of Care Note  Patient: Gabrielle Terry  Procedure(s) Performed: Procedure(s): ESOPHAGOGASTRODUODENOSCOPY (EGD) WITH PROPOFOL (N/A)  Patient Location: PACU  Anesthesia Type:General  Level of Consciousness: awake and sedated  Airway & Oxygen Therapy: Patient Spontanous Breathing and Patient connected to nasal cannula oxygen  Post-op Assessment: Report given to RN and Post -op Vital signs reviewed and stable  Post vital signs: Reviewed  Last Vitals:  Filed Vitals:   01/26/16 0947  BP: 128/83  Pulse: 75  Temp: 36.8 C  Resp: 16    Last Pain: There were no vitals filed for this visit.       Complications: No apparent anesthesia complications

## 2016-01-27 ENCOUNTER — Encounter: Payer: Self-pay | Admitting: Unknown Physician Specialty

## 2016-01-27 LAB — SURGICAL PATHOLOGY

## 2016-02-02 ENCOUNTER — Encounter: Payer: Self-pay | Admitting: General Surgery

## 2016-02-02 DIAGNOSIS — Z1231 Encounter for screening mammogram for malignant neoplasm of breast: Secondary | ICD-10-CM | POA: Diagnosis not present

## 2016-02-08 ENCOUNTER — Encounter: Payer: Self-pay | Admitting: General Surgery

## 2016-02-08 ENCOUNTER — Ambulatory Visit (INDEPENDENT_AMBULATORY_CARE_PROVIDER_SITE_OTHER): Payer: BLUE CROSS/BLUE SHIELD | Admitting: General Surgery

## 2016-02-08 VITALS — BP 124/78 | HR 72 | Resp 12 | Ht 65.0 in

## 2016-02-08 DIAGNOSIS — N6019 Diffuse cystic mastopathy of unspecified breast: Secondary | ICD-10-CM

## 2016-02-08 DIAGNOSIS — N63 Unspecified lump in unspecified breast: Secondary | ICD-10-CM

## 2016-02-08 NOTE — Progress Notes (Signed)
Patient ID: Gabrielle Terry, female   DOB: April 13, 1961, 55 y.o.   MRN: [id number]  Chief Complaint  Patient presents with  . Follow-up    mammogram    HPI Gabrielle Terry is a 55 y.o. female who presents for a breast evaluation. History positive for fibrocystic disease. The most recent mammogram was done on 02/02/16 at [facility]. Patient does perform regular self breast checks and gets regular mammograms done. Patient states no new breast issues.  I have reviewed the history of present illness with the patient.  HPI  Past Medical History  Diagnosis Date  . Hypertension   . Asthma 2009  . Ulcer   . Diffuse cystic mastopathy   . Breast hematoma 2011    Right  . Colon polyp 2013    3  . Hypercholesteremia   . Foot cramps   . Arthritis     knees  . GERD (gastroesophageal reflux disease)   . Cough     from sinus drainage    Past Surgical History  Procedure Laterality Date  . Cesarean section  1985, 1992  . Salpingo oophorectmy  1997  . Abdominal hysterectomy  1997  . Breast surgery Right 2011    hematoma  . Myomectomy  1991  . Nasal septum surgery  2013  . Colonoscopy  2013    3 polyps/ Dr Jamal Collin  . Image guided sinus surgery N/A 04/29/2015    Procedure: IMAGE GUIDED SINUS SURGERY;  Surgeon: Margaretha Sheffield, MD;  Location: [place];  Service: ENT;  Laterality: N/A;  GAVE DISK TO CE CE  . Maxillary antrostomy Bilateral 04/29/2015    Procedure: MAXILLARY ANTROSTOMY WITH REMOVAL OF CONTENTS;  Surgeon: Margaretha Sheffield, MD;  Location: [place];  Service: ENT;  Laterality: Bilateral;  . Frontal sinus exploration Bilateral 04/29/2015    Procedure: FRONTAL SINUS EXPLORATION;  Surgeon: Margaretha Sheffield, MD;  Location: [place];  Service: ENT;  Laterality: Bilateral;  . Ethmoidectomy Bilateral 04/29/2015    Procedure: TOTAL ETHMOIDECTOMY;  Surgeon: Margaretha Sheffield, MD;  Location: [place];  Service: ENT;  Laterality: Bilateral;  . Sphenoidectomy Bilateral 04/29/2015     Procedure: Coralee Pesa;  Surgeon: Margaretha Sheffield, MD;  Location: [place];  Service: ENT;  Laterality: Bilateral;  . Tubal ligation    . Uterine fibroid surgery      removed  . Esophagogastroduodenoscopy (egd) with propofol N/A 01/26/2016    Procedure: ESOPHAGOGASTRODUODENOSCOPY (EGD) WITH PROPOFOL;  Surgeon: Manya Silvas, MD;  Location: [place];  Service: Endoscopy;  Laterality: N/A;    Family History  Problem Relation Age of Onset  . Adopted: Yes  . Congestive Heart Failure Father   . Cancer Brother   . Other Brother     brain tumor  . Hypertension Maternal Aunt   . Thyroid disease Maternal Aunt   . Diabetes Maternal Grandmother     Social History Social History  Substance Use Topics  . Smoking status: Never Smoker   . Smokeless tobacco: Never Used  . Alcohol Use: 0.6 oz/week    1 Glasses of wine per week    No Known Allergies  Current Outpatient Prescriptions  Medication Sig Dispense Refill  . albuterol (PROVENTIL HFA;VENTOLIN HFA) 108 (90 Base) MCG/ACT inhaler Inhale 1-2 puffs into the lungs every 4 (four) hours as needed for wheezing. 1 Inhaler 12  . amLODipine (NORVASC) 2.5 MG tablet TAKE 1 TABLET BY MOUTH EVERY DAY 30 tablet 12  . losartan-hydrochlorothiazide (HYZAAR) 100-25 MG tablet  TAKE 1 TABLET BY MOUTH DAILY 30 tablet 12  . lovastatin (MEVACOR) 20 MG tablet TK 1 T PO D HS  6  . montelukast (SINGULAIR) 10 MG tablet Take 1 tablet (10 mg total) by mouth at bedtime. 30 tablet 11  . pantoprazole (PROTONIX) 40 MG tablet   3  . polyethylene glycol (MIRALAX / GLYCOLAX) packet Take 17 g by mouth daily.    . ranitidine (ZANTAC) 150 MG tablet TK 1 T PO BID  6  . sucralfate (CARAFATE) 1 g tablet Take 1 g by mouth 4 (four) times daily -  with meals and at bedtime.     No current facility-administered medications for this visit.    Review of Systems Review of Systems  Constitutional: Negative.   Respiratory: Negative.   Cardiovascular: Negative.      Blood pressure 124/78, pulse 72, resp. rate 12, height 5\' 5"  (1.651 m).  Physical Exam Physical Exam  Constitutional: She is oriented to person, place, and time. She appears well-developed and well-nourished.  Eyes: Conjunctivae are normal. No scleral icterus.  Neck: Neck supple. No thyromegaly present.  Cardiovascular: Normal rate, regular rhythm and normal heart sounds.   Pulmonary/Chest: Effort normal and breath sounds normal. Right breast exhibits no inverted nipple, no mass, no nipple discharge, no skin change and no tenderness. Left breast exhibits no inverted nipple, no mass, no nipple discharge, no skin change and no tenderness.  Lymphadenopathy:    She has no cervical adenopathy.    She has no axillary adenopathy.  Neurological: She is alert and oriented to person, place, and time.  Skin: Skin is warm and dry.    Data Reviewed Mammogram reviewed-stable  Assessment    Diffuse cystic mastopathy. Stable.     Plan    Patient will be asked to return to the office in one year with a bilateral screening mammogram.   Due for colonoscopy in 2018, already scheduled.     This information has been scribed by Verlene Mayer, CMA   PCP: Dr. Shearon Balo 02/09/2016, 12:24 PM

## 2016-02-08 NOTE — Patient Instructions (Addendum)
Continue self breast exams. Call office for any new breast issues or concerns. Patient will be asked to return to the office in one year with a bilateral screening mammogram. 

## 2016-02-09 ENCOUNTER — Encounter: Payer: Self-pay | Admitting: General Surgery

## 2016-02-15 DIAGNOSIS — R102 Pelvic and perineal pain: Secondary | ICD-10-CM | POA: Diagnosis not present

## 2016-02-15 DIAGNOSIS — Z01419 Encounter for gynecological examination (general) (routine) without abnormal findings: Secondary | ICD-10-CM | POA: Diagnosis not present

## 2016-02-21 ENCOUNTER — Ambulatory Visit (INDEPENDENT_AMBULATORY_CARE_PROVIDER_SITE_OTHER): Payer: BLUE CROSS/BLUE SHIELD | Admitting: Family Medicine

## 2016-02-21 ENCOUNTER — Encounter: Payer: Self-pay | Admitting: Family Medicine

## 2016-02-21 VITALS — BP 90/50 | HR 88 | Temp 98.7°F | Resp 16 | Wt 209.0 lb

## 2016-02-21 DIAGNOSIS — I10 Essential (primary) hypertension: Secondary | ICD-10-CM | POA: Diagnosis not present

## 2016-02-21 DIAGNOSIS — J4531 Mild persistent asthma with (acute) exacerbation: Secondary | ICD-10-CM

## 2016-02-21 MED ORDER — PREDNISONE 5 MG PO TABS
5.0000 mg | ORAL_TABLET | Freq: Every day | ORAL | 0 refills | Status: DC
Start: 2016-02-21 — End: 2016-05-23

## 2016-02-21 MED ORDER — FLUTICASONE-SALMETEROL 230-21 MCG/ACT IN AERO: 2.0000 | INHALATION_SPRAY | Freq: Two times a day (BID) | RESPIRATORY_TRACT | 12 refills | Status: DC

## 2016-02-21 NOTE — Progress Notes (Signed)
Patient: Gabrielle Terry Female    DOB: 04/19/1961   55 y.o.   MRN: [id number] Visit Date: 02/21/2016  Today's Provider: Vernie Murders, PA   Chief Complaint  Patient presents with  . Cough  . Wheezing  . Sinus Problem   Subjective:    Cough  This is a recurrent problem. The current episode started more than 1 month ago (month). The problem has been waxing and waning. The problem occurs every few minutes. The cough is non-productive. Associated symptoms include headaches, nasal congestion, postnasal drip, rhinorrhea, shortness of breath and wheezing. Pertinent negatives include no chest pain, chills, ear congestion, ear pain, fever, heartburn, hemoptysis, myalgias, rash, sore throat, sweats or weight loss. The symptoms are aggravated by exercise, lying down and pollens. Treatments tried: afrin. The treatment provided no relief. Her past medical history is significant for asthma, environmental allergies and pneumonia. There is no history of bronchiectasis, bronchitis, COPD or emphysema.   Past Medical History:  Diagnosis Date  . Arthritis    knees  . Asthma 2009  . Breast hematoma 2011   Right  . Colon polyp 2013   3  . Cough    from sinus drainage  . Diffuse cystic mastopathy   . Foot cramps   . GERD (gastroesophageal reflux disease)   . Hypercholesteremia   . Hypertension   . Ulcer    Past Surgical History:  Procedure Laterality Date  . ABDOMINAL HYSTERECTOMY  1997  . BREAST SURGERY Right 2011   hematoma  . [place]  . COLONOSCOPY  2013   3 polyps/ Dr Jamal Collin  . ESOPHAGOGASTRODUODENOSCOPY (EGD) WITH PROPOFOL N/A 01/26/2016   Procedure: ESOPHAGOGASTRODUODENOSCOPY (EGD) WITH PROPOFOL;  Surgeon: Manya Silvas, MD;  [facility];  Service: Endoscopy;  Laterality: N/A;  . ETHMOIDECTOMY Bilateral 04/29/2015   Procedure: TOTAL ETHMOIDECTOMY;  Surgeon: Margaretha Sheffield, MD;  Location: [place];  Service: ENT;  Laterality: Bilateral;    . FRONTAL SINUS EXPLORATION Bilateral 04/29/2015   Procedure: FRONTAL SINUS EXPLORATION;  Surgeon: Margaretha Sheffield, MD;  Location: [place];  Service: ENT;  Laterality: Bilateral;  . IMAGE GUIDED SINUS SURGERY N/A 04/29/2015   Procedure: IMAGE GUIDED SINUS SURGERY;  Surgeon: Margaretha Sheffield, MD;  Location: [place];  Service: ENT;  Laterality: N/A;  GAVE DISK TO CE CE  . MAXILLARY ANTROSTOMY Bilateral 04/29/2015   Procedure: MAXILLARY ANTROSTOMY WITH REMOVAL OF CONTENTS;  Surgeon: Margaretha Sheffield, MD;  Location: [place];  Service: ENT;  Laterality: Bilateral;  . MYOMECTOMY  1991  . NASAL SEPTUM SURGERY  2013  . salpingo oophorectmy  1997  . SPHENOIDECTOMY Bilateral 04/29/2015   Procedure: SPHENOIDECTOMY;  Surgeon: Margaretha Sheffield, MD;  Location: [place];  Service: ENT;  Laterality: Bilateral;  . TUBAL LIGATION    . UTERINE FIBROID SURGERY     removed   Family History  Problem Relation Age of Onset  . Adopted: Yes  . Congestive Heart Failure Father   . Cancer Brother   . Other Brother     brain tumor  . Hypertension Maternal Aunt   . Thyroid disease Maternal Aunt   . Diabetes Maternal Grandmother    No Known Allergies   Current Meds  Medication Sig  . albuterol (PROVENTIL HFA;VENTOLIN HFA) 108 (90 Base) MCG/ACT inhaler Inhale 1-2 puffs into the lungs every 4 (four) hours as needed for wheezing.  Marland Kitchen amLODipine (NORVASC) 2.5 MG tablet TAKE 1 TABLET BY  MOUTH EVERY DAY  . losartan-hydrochlorothiazide (HYZAAR) 100-25 MG tablet TAKE 1 TABLET BY MOUTH DAILY  . lovastatin (MEVACOR) 20 MG tablet TK 1 T PO D HS  . montelukast (SINGULAIR) 10 MG tablet Take 1 tablet (10 mg total) by mouth at bedtime.  . pantoprazole (PROTONIX) 40 MG tablet   . polyethylene glycol (MIRALAX / GLYCOLAX) packet Take 17 g by mouth daily.  . ranitidine (ZANTAC) 150 MG tablet TK 1 T PO BID  . sucralfate (CARAFATE) 1 g tablet Take 1 g by mouth 4 (four) times daily -  with meals and at  bedtime.    Review of Systems  Constitutional: Negative for appetite change, chills, fatigue, fever and weight loss.  HENT: Positive for congestion, postnasal drip, rhinorrhea and sinus pressure. Negative for ear pain and sore throat.   Respiratory: Positive for cough, shortness of breath and wheezing. Negative for hemoptysis and chest tightness.   Cardiovascular: Negative for chest pain and palpitations.  Gastrointestinal: Negative for abdominal pain, heartburn, nausea and vomiting.  Musculoskeletal: Negative for myalgias.  Skin: Negative for rash.  Allergic/Immunologic: Positive for environmental allergies.  Neurological: Positive for headaches. Negative for dizziness and weakness.    Social History  Substance Use Topics  . Smoking status: Never Smoker  . Smokeless tobacco: Never Used  . Alcohol use 0.6 oz/week    1 Glasses of wine per week   Objective:   BP (!) 90/50 (BP Location: Left Arm, Patient Position: Sitting, Cuff Size: Large)   Pulse 88   Temp 98.7 F (37.1 C) (Oral)   Resp 16   Wt 209 lb (94.8 kg)   SpO2 94%   BMI 34.78 kg/m  BP Readings from Last 3 Encounters:  02/21/16 (!) 90/50  02/08/16 124/78  01/26/16 114/80    Physical Exam  Constitutional: She is oriented to person, place, and time. She appears well-developed and well-nourished.  HENT:  Head: Normocephalic.  Right Ear: External ear normal.  Left Ear: External ear normal.  Nose: Nose normal.  Mouth/Throat: Oropharynx is clear and moist.  Eyes: Conjunctivae are normal.  Neck: Neck supple.  Cardiovascular: Normal rate and regular rhythm.   Pulmonary/Chest: Effort normal. She has wheezes.  Slightly coarse breath sounds with hacking cough. No rales or rhonchi.  Abdominal: Soft. Bowel sounds are normal.  Musculoskeletal: She exhibits no edema.  Lymphadenopathy:    She has no cervical adenopathy.  Neurological: She is alert and oriented to person, place, and time.  Skin: No rash noted.    Assessment & Plan:     1. Airway hyperreactivity, mild persistent, with acute exacerbation Some wheezing and hacking cough over the past several days. Still using Albuterol prn rescue, Singulair daily and Advair-HFA BID. No fever or sinus pressure. Some sneezing without sore throat. Will treat with prednisone taper and refill Advair-HFA. Recheck in 5-7 days if no better. - fluticasone-salmeterol (ADVAIR HFA) 230-21 MCG/ACT inhaler; Inhale 2 puffs into the lungs 2 (two) times daily.  Dispense: 1 Inhaler; Refill: 12 - predniSONE (DELTASONE) 5 MG tablet; Take 1 tablet (5 mg total) by mouth daily with breakfast. Tapering dose as directed (6,5,4,3,2,1)  Dispense: 21 tablet; Refill: 0  2. Essential hypertension Normally takes Amlodipine 2.5 mg qd with Hyzaar qd. BP lower than usual today. Will hold Amlodipine and encouraged to drink extra fluids. To check BP daily the next few days to see if BP comes back up.     Vernie Murders, PA  [facility]

## 2016-02-22 ENCOUNTER — Encounter: Payer: Self-pay | Admitting: Family Medicine

## 2016-05-18 ENCOUNTER — Other Ambulatory Visit: Payer: Self-pay

## 2016-05-18 MED ORDER — LOVASTATIN 20 MG PO TABS: 20.0000 mg | ORAL_TABLET | Freq: Every day | ORAL | 12 refills | Status: DC

## 2016-05-23 ENCOUNTER — Ambulatory Visit (INDEPENDENT_AMBULATORY_CARE_PROVIDER_SITE_OTHER): Payer: BLUE CROSS/BLUE SHIELD | Admitting: Family Medicine

## 2016-05-23 ENCOUNTER — Encounter: Payer: Self-pay | Admitting: Family Medicine

## 2016-05-23 VITALS — BP 112/74 | HR 99 | Temp 98.4°F | Resp 18 | Wt 216.0 lb

## 2016-05-23 DIAGNOSIS — J45901 Unspecified asthma with (acute) exacerbation: Secondary | ICD-10-CM

## 2016-05-23 MED ORDER — HYDROCOD POLST-CPM POLST ER 10-8 MG/5ML PO SUER: 5.0000 mL | Freq: Every evening | ORAL | 0 refills | Status: DC | PRN

## 2016-05-23 MED ORDER — PREDNISONE 10 MG PO TABS: 10.0000 mg | ORAL_TABLET | Freq: Every day | ORAL | 0 refills | Status: DC

## 2016-05-23 NOTE — Progress Notes (Signed)
Subjective:  HPI Pt is here today for cough, wheezing, and shortness of breath. She reports that it has been going on for about 2 weeks. Denies fevers, chills or sickness related chills (sweats from Menopause). She does not feel bad. She does have known asthma. She has been taking her rescue inhaler more than usual and probably more than prescribe per pt. She has also been taking her Advair daily. Her cough and wheezing are worse at night.   Prior to Admission medications   Medication Sig Start Date End Date Taking? Authorizing Provider  albuterol (PROVENTIL HFA;VENTOLIN HFA) 108 (90 Base) MCG/ACT inhaler Inhale 1-2 puffs into the lungs every 4 (four) hours as needed for wheezing. 12/07/15  Yes Richard Maceo Pro., MD  amLODipine (NORVASC) 2.5 MG tablet TAKE 1 TABLET BY MOUTH EVERY DAY 11/26/15  Yes Jerrol Banana., MD  fluticasone-salmeterol (ADVAIR HFA) [phone] MCG/ACT inhaler Inhale 2 puffs into the lungs 2 (two) times daily. 02/21/16  Yes Vickki Muff Chrismon, PA  losartan-hydrochlorothiazide (HYZAAR) 100-25 MG tablet TAKE 1 TABLET BY MOUTH DAILY 12/29/15  Yes Richard Maceo Pro., MD  lovastatin (MEVACOR) 20 MG tablet Take 1 tablet (20 mg total) by mouth daily. 05/18/16  Yes Richard Maceo Pro., MD  montelukast (SINGULAIR) 10 MG tablet Take 1 tablet (10 mg total) by mouth at bedtime. 09/14/15  Yes Carmon Ginsberg, PA  pantoprazole (PROTONIX) 40 MG tablet  11/16/15  Yes Historical Provider, MD  polyethylene glycol (MIRALAX / GLYCOLAX) packet Take 17 g by mouth daily.   Yes Historical Provider, MD  ranitidine (ZANTAC) 150 MG tablet TK 1 T PO BID 01/13/16   Historical Provider, MD  sucralfate (CARAFATE) 1 g tablet Take 1 g by mouth 4 (four) times daily -  with meals and at bedtime.    Historical Provider, MD    Patient Active Problem List   Diagnosis Date Noted  . AB (asthmatic bronchitis) 10/14/2015  . Abnormal bruising 10/14/2015  . Toxic effect of venom 10/14/2015  . Airway  hyperreactivity 10/14/2015  . Acid reflux 10/14/2015  . Elective abortion 10/14/2015  . Hypercholesteremia 10/14/2015  . Cannot sleep 10/14/2015  . Hemorrhoids, internal 10/14/2015  . Climacteric 10/14/2015  . Localized superficial swelling, mass, or lump 10/14/2015  . Adiposity 10/14/2015  . Plantar fasciitis 10/14/2015  . BP (high blood pressure) 04/07/2015  . Allergic rhinitis 04/07/2015  . Arthritis of knee, degenerative 12/18/2014  . Diffuse cystic mastopathy 01/28/2014  . Personal history of colonic polyps 01/09/2013    Past Medical History:  Diagnosis Date  . Arthritis    knees  . Asthma 2009  . Breast hematoma 2011   Right  . Colon polyp 2013   3  . Cough    from sinus drainage  . Diffuse cystic mastopathy   . Foot cramps   . GERD (gastroesophageal reflux disease)   . Hypercholesteremia   . Hypertension   . Ulcer [facility])     Social History   Social History  . Marital status: Single    Spouse name: N/A  . Number of children: N/A  . Years of education: N/A   Occupational History  . Not on file.   Social History Main Topics  . Smoking status: Never Smoker  . Smokeless tobacco: Never Used  . Alcohol use 0.6 oz/week    1 Glasses of wine per week  . Drug use: No  . Sexual activity: Not on file   Other Topics Concern  .  Not on file   Social History Narrative  . No narrative on file    No Known Allergies  Review of Systems  Constitutional: Negative.   HENT: Negative.   Eyes: Negative.   Respiratory: Positive for cough, sputum production, shortness of breath and wheezing.   Cardiovascular: Positive for chest pain (tightness sometimes.).  Gastrointestinal: Negative.   Genitourinary: Negative.   Musculoskeletal: Negative.   Skin: Negative.   Neurological: Negative.   Endo/Heme/Allergies: Negative.   Psychiatric/Behavioral: Negative.     Immunization History  Administered Date(s) Administered  . Tdap 08/27/2012   Objective:  BP 112/74 (BP  Location: Left Arm, Patient Position: Sitting, Cuff Size: Large)   Pulse 99   Temp 98.4 F (36.9 C) (Oral)   Resp 18   Wt 216 lb (98 kg)   SpO2 94%   BMI 35.94 kg/m   Physical Exam  Constitutional: She is oriented to person, place, and time and well-developed, well-nourished, and in no distress.  HENT:  Head: Normocephalic and atraumatic.  Right Ear: External ear normal.  Left Ear: External ear normal.  Nose: Nose normal.  Mouth/Throat: Oropharynx is clear and moist.  Eyes: Conjunctivae are normal. No scleral icterus.  Neck: No thyromegaly present.  Cardiovascular: Normal rate, regular rhythm and normal heart sounds.   Pulmonary/Chest: Effort normal. She has wheezes (mild expirstory wheezes).  Abdominal: Soft.  Lymphadenopathy:    She has no cervical adenopathy.  Neurological: She is alert and oriented to person, place, and time.  Skin: Skin is warm and dry.  Psychiatric: Mood, memory, affect and judgment normal.    Lab Results  Component Value Date   WBC 7.6 11/16/2015   HGB 13.3 10/27/2014   HCT 41.4 11/16/2015   PLT 298 11/16/2015   GLUCOSE 102 (H) 11/16/2015   CHOL 210 (A) 09/30/2013   TRIG 47 09/30/2013   HDL 46 09/30/2013   LDLCALC 155 09/30/2013   TSH 2.85 09/30/2013    CMP     Component Value Date/Time   NA 141 11/16/2015 1442   K 4.2 11/16/2015 1442   CL 97 11/16/2015 1442   CO2 25 11/16/2015 1442   GLUCOSE 102 (H) 11/16/2015 1442   BUN 17 11/16/2015 1442   CREATININE 0.82 11/16/2015 1442   CALCIUM 10.2 11/16/2015 1442   PROT 7.3 11/16/2015 1442   ALBUMIN 4.6 11/16/2015 1442   AST 19 11/16/2015 1442   ALT 20 11/16/2015 1442   ALKPHOS 78 11/16/2015 1442   BILITOT 0.5 11/16/2015 1442   GFRNONAA 81 11/16/2015 1442   GFRAA 94 11/16/2015 1442    Assessment and Plan :  1. Asthmatic bronchitis with acute exacerbation, unspecified asthma severity, unspecified whether persistent  - predniSONE (DELTASONE) 10 MG tablet; Take 1 tablet (10 mg total) by  mouth daily with breakfast. Tapering dose as directed (6,5,4,3,2,1)  Dispense: 42 tablet; Refill: 0 - chlorpheniramine-HYDROcodone (TUSSIONEX PENNKINETIC ER) 10-8 MG/5ML SUER; Take 5 mLs by mouth at bedtime as needed for cough.  Dispense: 140 mL; Refill: 0   HPI, Exam, and A&P Transcribed under the direction and in the presence of Richard L. Cranford Mon, MD  Electronically Signed: Webb Laws, CMA I have done the exam and reviewed the above chart and it is accurate to the best of my knowledge.  Miguel Aschoff MD [facility] 05/23/2016 3:18 PM

## 2016-07-04 ENCOUNTER — Ambulatory Visit: Payer: BLUE CROSS/BLUE SHIELD | Admitting: Family Medicine

## 2016-07-11 DIAGNOSIS — N951 Menopausal and female climacteric states: Secondary | ICD-10-CM | POA: Diagnosis not present

## 2016-07-14 ENCOUNTER — Other Ambulatory Visit: Payer: Self-pay | Admitting: Family Medicine

## 2016-07-14 DIAGNOSIS — I10 Essential (primary) hypertension: Secondary | ICD-10-CM

## 2016-07-14 NOTE — Telephone Encounter (Signed)
Last ov 05/23/16. Last filled 12/29/15. Please review. Thank you. sd

## 2016-09-08 ENCOUNTER — Ambulatory Visit: Payer: BLUE CROSS/BLUE SHIELD | Admitting: Physician Assistant

## 2016-09-14 ENCOUNTER — Other Ambulatory Visit: Payer: Self-pay | Admitting: Family Medicine

## 2016-09-14 ENCOUNTER — Other Ambulatory Visit: Payer: Self-pay | Admitting: Physician Assistant

## 2016-09-14 DIAGNOSIS — J301 Allergic rhinitis due to pollen: Secondary | ICD-10-CM

## 2016-09-14 DIAGNOSIS — I10 Essential (primary) hypertension: Secondary | ICD-10-CM

## 2016-09-14 NOTE — Telephone Encounter (Signed)
See refill request.

## 2016-10-19 ENCOUNTER — Other Ambulatory Visit: Payer: Self-pay | Admitting: Family Medicine

## 2016-10-19 DIAGNOSIS — I10 Essential (primary) hypertension: Secondary | ICD-10-CM

## 2016-11-06 ENCOUNTER — Encounter: Payer: Self-pay | Admitting: Family Medicine

## 2016-11-06 ENCOUNTER — Ambulatory Visit (INDEPENDENT_AMBULATORY_CARE_PROVIDER_SITE_OTHER): Payer: BLUE CROSS/BLUE SHIELD | Admitting: Family Medicine

## 2016-11-06 VITALS — BP 116/88 | HR 74 | Temp 98.4°F | Resp 17 | Wt 215.0 lb

## 2016-11-06 DIAGNOSIS — J4521 Mild intermittent asthma with (acute) exacerbation: Secondary | ICD-10-CM

## 2016-11-06 DIAGNOSIS — K219 Gastro-esophageal reflux disease without esophagitis: Secondary | ICD-10-CM | POA: Diagnosis not present

## 2016-11-06 DIAGNOSIS — J301 Allergic rhinitis due to pollen: Secondary | ICD-10-CM | POA: Diagnosis not present

## 2016-11-06 MED ORDER — PREDNISONE 20 MG PO TABS: ORAL_TABLET | ORAL | 1 refills | Status: DC

## 2016-11-06 MED ORDER — FLUTICASONE PROPIONATE 50 MCG/ACT NA SUSP: 2.0000 | Freq: Every day | NASAL | 6 refills | Status: DC

## 2016-11-06 NOTE — Patient Instructions (Signed)
Put the head of your bed back up on 6 inch blocks. If the Nexium helps continue it for two weeks then switch to Zantac 150 mg. At bedtime.

## 2016-11-06 NOTE — Progress Notes (Signed)
Subjective:     Patient ID: Gabrielle Terry, female   DOB: 1961/02/24, 56 y.o.   MRN: [id number]  HPI  Chief Complaint  Patient presents with  . Shortness of Breath    Patient comes  to office today with concerns of shortness of breath intermittent for the past 3 months. Patient states that she has a history of asthma and has been using her rescure inhaler (Albuterol) every other day especially in the PM. Patient states dry cough and wheezing is associated with shortness of breath.   Reports increased cough/shortness of breath with exertion and when lying in bed at night. Currently on Carafate for acid reflux but continuing to get nocturnal sx. States her HOB is no elevated at this time. Also states she has been having sinus drip from allergies despite Singulair.   Review of Systems     Objective:   Physical Exam  Constitutional: She appears well-developed and well-nourished. No distress.  Cardiovascular: Normal rate and regular rhythm.   Pulmonary/Chest:  Bilateral posterior expiratory wheezes.  Musculoskeletal: She exhibits no edema (of lower extremties).       Assessment:    1. Seasonal allergic rhinitis due to pollen - fluticasone (FLONASE) 50 MCG/ACT nasal spray; Place 2 sprays into both nostrils daily.  Dispense: 16 g; Refill: 6  2. Mild intermittent asthma with acute exacerbation: multiple triggers - predniSONE (DELTASONE) 20 MG tablet; One pill twice daily for 5 days  Dispense: 10 tablet; Refill: 1  3. Gastroesophageal reflux disease without esophagitis: samples of Nexium x 8 days    Plan:    Take Nexium for two weeks along with [facility] elevation-After two weeks start Zantac 150 at bedtime.

## 2016-11-29 ENCOUNTER — Other Ambulatory Visit: Payer: Self-pay | Admitting: Family Medicine

## 2016-11-29 DIAGNOSIS — R1310 Dysphagia, unspecified: Secondary | ICD-10-CM

## 2016-11-29 DIAGNOSIS — K219 Gastro-esophageal reflux disease without esophagitis: Secondary | ICD-10-CM

## 2017-02-05 ENCOUNTER — Other Ambulatory Visit: Payer: Self-pay | Admitting: Family Medicine

## 2017-02-06 DIAGNOSIS — Z1231 Encounter for screening mammogram for malignant neoplasm of breast: Secondary | ICD-10-CM | POA: Diagnosis not present

## 2017-02-09 ENCOUNTER — Encounter: Payer: Self-pay | Admitting: General Surgery

## 2017-02-12 ENCOUNTER — Encounter: Payer: Self-pay | Admitting: General Surgery

## 2017-02-12 ENCOUNTER — Ambulatory Visit (INDEPENDENT_AMBULATORY_CARE_PROVIDER_SITE_OTHER): Payer: BLUE CROSS/BLUE SHIELD | Admitting: General Surgery

## 2017-02-12 VITALS — BP 130/76 | HR 70 | Resp 12 | Ht 65.0 in | Wt 212.0 lb

## 2017-02-12 DIAGNOSIS — N63 Unspecified lump in unspecified breast: Secondary | ICD-10-CM

## 2017-02-12 DIAGNOSIS — R928 Other abnormal and inconclusive findings on diagnostic imaging of breast: Secondary | ICD-10-CM | POA: Diagnosis not present

## 2017-02-12 DIAGNOSIS — Z86010 Personal history of colonic polyps: Secondary | ICD-10-CM

## 2017-02-12 MED ORDER — POLYETHYLENE GLYCOL 3350 17 GM/SCOOP PO POWD: ORAL | 0 refills | Status: DC

## 2017-02-12 NOTE — Progress Notes (Signed)
Patient ID: Gabrielle Terry, female   DOB: 1961-02-24, 56 y.o.   MRN: [id number]  Chief Complaint  Patient presents with  . Follow-up  . Colonoscopy    HPI Gabrielle Terry is a 56 y.o. female who presents for a breast evaluation. The most recent mammogram was done on 02/06/2017.  Patient does perform regular self breast checks and gets regular mammograms done.   Due for colonoscopy, last colonoscopy was in 2013. Hx of colon polyp.   HPI  Past Medical History:  Diagnosis Date  . Arthritis    knees  . Asthma 2009  . Breast hematoma 2011   Right  . Colon polyp 2013   3  . Cough    from sinus drainage  . Diffuse cystic mastopathy   . Foot cramps   . GERD (gastroesophageal reflux disease)   . Hypercholesteremia   . Hypertension   . Ulcer     Past Surgical History:  Procedure Laterality Date  . ABDOMINAL HYSTERECTOMY  1997  . BREAST SURGERY Right 2011   hematoma  . [place]  . COLONOSCOPY  2013   3 polyps/ Dr Jamal Collin  . ESOPHAGOGASTRODUODENOSCOPY (EGD) WITH PROPOFOL N/A 01/26/2016   Procedure: ESOPHAGOGASTRODUODENOSCOPY (EGD) WITH PROPOFOL;  Surgeon: Manya Silvas, MD;  Location: [facility] ENDOSCOPY;  Service: Endoscopy;  Laterality: N/A;  . ETHMOIDECTOMY Bilateral 04/29/2015   Procedure: TOTAL ETHMOIDECTOMY;  Surgeon: Margaretha Sheffield, MD;  Location: [place];  Service: ENT;  Laterality: Bilateral;  . FRONTAL SINUS EXPLORATION Bilateral 04/29/2015   Procedure: FRONTAL SINUS EXPLORATION;  Surgeon: Margaretha Sheffield, MD;  Location: [place];  Service: ENT;  Laterality: Bilateral;  . IMAGE GUIDED SINUS SURGERY N/A 04/29/2015   Procedure: IMAGE GUIDED SINUS SURGERY;  Surgeon: Margaretha Sheffield, MD;  Location: [place];  Service: ENT;  Laterality: N/A;  GAVE DISK TO CE CE  . MAXILLARY ANTROSTOMY Bilateral 04/29/2015   Procedure: MAXILLARY ANTROSTOMY WITH REMOVAL OF CONTENTS;  Surgeon: Margaretha Sheffield, MD;  Location: [place];  Service: ENT;   Laterality: Bilateral;  . MYOMECTOMY  1991  . NASAL SEPTUM SURGERY  2013  . salpingo oophorectmy  1997  . SPHENOIDECTOMY Bilateral 04/29/2015   Procedure: SPHENOIDECTOMY;  Surgeon: Margaretha Sheffield, MD;  Location: [place];  Service: ENT;  Laterality: Bilateral;  . TUBAL LIGATION    . UTERINE FIBROID SURGERY     removed    Family History  Problem Relation Age of Onset  . Adopted: Yes  . Congestive Heart Failure Father   . Cancer Brother   . Other Brother        brain tumor  . Hypertension Maternal Aunt   . Thyroid disease Maternal Aunt   . Diabetes Maternal Grandmother     Social History Social History  Substance Use Topics  . Smoking status: Never Smoker  . Smokeless tobacco: Never Used  . Alcohol use 0.6 oz/week    1 Glasses of wine per week    No Known Allergies  Current Outpatient Prescriptions  Medication Sig Dispense Refill  . amLODipine (NORVASC) 2.5 MG tablet TAKE 1 TABLET BY MOUTH EVERY DAY 30 tablet 11  . fluticasone (FLONASE) 50 MCG/ACT nasal spray Place 2 sprays into both nostrils daily. 16 g 6  . fluticasone-salmeterol (ADVAIR HFA) 230-21 MCG/ACT inhaler Inhale 2 puffs into the lungs 2 (two) times daily. 1 Inhaler 12  . losartan-hydrochlorothiazide (HYZAAR) 100-25 MG tablet TAKE 1 TABLET BY MOUTH DAILY 30 tablet 12  .  lovastatin (MEVACOR) 20 MG tablet Take 1 tablet (20 mg total) by mouth daily. 30 tablet 12  . montelukast (SINGULAIR) 10 MG tablet TAKE 1 TABLET(10 MG) BY MOUTH AT BEDTIME 30 tablet 11  . pantoprazole (PROTONIX) 40 MG tablet   3  . polyethylene glycol (MIRALAX / GLYCOLAX) packet Take 17 g by mouth daily.    Marland Kitchen PROAIR HFA 108 (90 Base) MCG/ACT inhaler INHALE 1 TO 2 PUFFS INTO THE LUNGS EVERY 4 HOURS AS NEEDED FOR WHEEZING 8 g 11  . sucralfate (CARAFATE) 1 g tablet TAKE 1 TABLET(1 GRAM) BY MOUTH FOUR TIMES DAILY AT BEDTIME WITH MEALS 120 tablet 11  . polyethylene glycol powder (GLYCOLAX/MIRALAX) powder 255 grams one bottle for colonoscopy  prep 255 g 0   No current facility-administered medications for this visit.     Review of Systems Review of Systems  Constitutional: Negative.   Respiratory: Negative.   Cardiovascular: Negative.   Gastrointestinal: Negative.     Blood pressure 130/76, pulse 70, resp. rate 12, height 5\' 5"  (1.651 m), weight 212 lb (96.2 kg).  Physical Exam Physical Exam  Constitutional: She is oriented to person, place, and time. She appears well-developed and well-nourished.  Eyes: Conjunctivae are normal. No scleral icterus.  Neck: Normal range of motion. Neck supple.  Cardiovascular: Normal rate, regular rhythm and normal heart sounds.   Pulmonary/Chest: Effort normal and breath sounds normal. Right breast exhibits no inverted nipple, no mass, no nipple discharge, no skin change and no tenderness. Left breast exhibits no inverted nipple, no mass, no nipple discharge, no skin change and no tenderness. Breasts are symmetrical.  Abdominal: Soft. Bowel sounds are normal. There is no tenderness.  Lymphadenopathy:    She has no cervical adenopathy.    She has no axillary adenopathy.  Neurological: She is alert and oriented to person, place, and time.  Skin: Skin is warm and dry.    Data Reviewed Mammogram reviewed   Assessment Hx of breast mass - stable mammogram and exam. Family hx uncertain as pt was adopted  Hx of colon polyp - due for colonoscopy     Plan  Pt to return to PCP for breast checks and mammograms    Colonoscopy with possible biopsy/polypectomy prn: Information regarding the procedure, including its potential risks and complications (including but not limited to perforation of the bowel, which may require emergency surgery to repair, and bleeding) was verbally given to the patient. Educational information regarding lower intestinal endoscopy was given to the patient. Written instructions for how to complete the bowel prep using Miralax were provided. The importance of drinking  ample fluids to avoid dehydration as a result of the prep emphasized.    HPI, Physical Exam, Assessment and Plan have been scribed under the direction and in the presence of Mckinley Jewel, MD  Gaspar Cola, CMA  I have completed the exam and reviewed the above documentation for accuracy and completeness.  I agree with the above.  Haematologist has been used and any errors in dictation or transcription are unintentional.  Lyza Houseworth G. Jamal Collin, M.D., F.A.C.S.   Junie Panning G 02/12/2017, 4:44 PM  Patient has been scheduled for a colonoscopy on 03-21-17 at [facility]. Miralax prescription has been sent in to the patient's pharmacy today. Colonoscopy instructions have been reviewed with the patient. This patient is aware to call the office if they have further questions.   Dominga Ferry, CMA

## 2017-02-12 NOTE — Patient Instructions (Addendum)
Pt to return to PCP for breast checks and mammograms    Colonoscopy, Adult A colonoscopy is an exam to look at the entire large intestine. During the exam, a lubricated, bendable tube is inserted into the anus and then passed into the rectum, colon, and other parts of the large intestine. A colonoscopy is often done as a part of normal colorectal screening or in response to certain symptoms, such as anemia, persistent diarrhea, abdominal pain, and blood in the stool. The exam can help screen for and diagnose medical problems, including:  Tumors.  Polyps.  Inflammation.  Areas of bleeding.  Tell a health care provider about:  Any allergies you have.  All medicines you are taking, including vitamins, herbs, eye drops, creams, and over-the-counter medicines.  Any problems you or family members have had with anesthetic medicines.  Any blood disorders you have.  Any surgeries you have had.  Any medical conditions you have.  Any problems you have had passing stool. What are the risks? Generally, this is a safe procedure. However, problems may occur, including:  Bleeding.  A tear in the intestine.  A reaction to medicines given during the exam.  Infection (rare).  What happens before the procedure? Eating and drinking restrictions Follow instructions from your health care provider about eating and drinking, which may include:  A few days before the procedure - follow a low-fiber diet. Avoid nuts, seeds, dried fruit, raw fruits, and vegetables.  1-3 days before the procedure - follow a clear liquid diet. Drink only clear liquids, such as clear broth or bouillon, black coffee or tea, clear juice, clear soft drinks or sports drinks, gelatin dessert, and popsicles. Avoid any liquids that contain red or purple dye.  On the day of the procedure - do not eat or drink anything during the 2 hours before the procedure, or within the time period that your health care provider  recommends.  Bowel prep If you were prescribed an oral bowel prep to clean out your colon:  Take it as told by your health care provider. Starting the day before your procedure, you will need to drink a large amount of medicated liquid. The liquid will cause you to have multiple loose stools until your stool is almost clear or light green.  If your skin or anus gets irritated from diarrhea, you may use these to relieve the irritation: ? Medicated wipes, such as adult wet wipes with aloe and vitamin E. ? A skin soothing-product like petroleum jelly.  If you vomit while drinking the bowel prep, take a break for up to 60 minutes and then begin the bowel prep again. If vomiting continues and you cannot take the bowel prep without vomiting, call your health care provider.  General instructions  Ask your health care provider about changing or stopping your regular medicines. This is especially important if you are taking diabetes medicines or blood thinners.  Plan to have someone take you home from the hospital or clinic. What happens during the procedure?  An IV tube may be inserted into one of your veins.  You will be given medicine to help you relax (sedative).  To reduce your risk of infection: ? Your health care team will wash or sanitize their hands. ? Your anal area will be washed with soap.  You will be asked to lie on your side with your knees bent.  Your health care provider will lubricate a long, thin, flexible tube. The tube will have a camera and  a light on the end.  The tube will be inserted into your anus.  The tube will be gently eased through your rectum and colon.  Air will be delivered into your colon to keep it open. You may feel some pressure or cramping.  The camera will be used to take images during the procedure.  A small tissue sample may be removed from your body to be examined under a microscope (biopsy). If any potential problems are found, the tissue  will be sent to a lab for testing.  If small polyps are found, your health care provider may remove them and have them checked for cancer cells.  The tube that was inserted into your anus will be slowly removed. The procedure may vary among health care providers and hospitals. What happens after the procedure?  Your blood pressure, heart rate, breathing rate, and blood oxygen level will be monitored until the medicines you were given have worn off.  Do not drive for 24 hours after the exam.  You may have a small amount of blood in your stool.  You may pass gas and have mild abdominal cramping or bloating due to the air that was used to inflate your colon during the exam.  It is up to you to get the results of your procedure. Ask your health care provider, or the department performing the procedure, when your results will be ready. This information is not intended to replace advice given to you by your health care provider. Make sure you discuss any questions you have with your health care provider. Document Released: 07/07/2000 Document Revised: 05/10/2016 Document Reviewed: 09/21/2015 Elsevier Interactive Patient Education  2018 [employer].

## 2017-03-13 ENCOUNTER — Other Ambulatory Visit: Payer: Self-pay | Admitting: General Surgery

## 2017-03-13 DIAGNOSIS — Z86010 Personal history of colonic polyps: Secondary | ICD-10-CM

## 2017-03-15 ENCOUNTER — Other Ambulatory Visit: Payer: Self-pay | Admitting: Family Medicine

## 2017-03-15 DIAGNOSIS — J4531 Mild persistent asthma with (acute) exacerbation: Secondary | ICD-10-CM

## 2017-03-15 NOTE — Telephone Encounter (Signed)
Last RF 02/21/16 Last OV 11/06/16. Okay to refill?

## 2017-03-21 ENCOUNTER — Encounter
Admission: RE | Disposition: A | Discharge status: Home / Self Care | Payer: Self-pay | Source: Ambulatory Visit | Attending: General Surgery

## 2017-03-21 ENCOUNTER — Ambulatory Visit
Admission: RE | Admit: 2017-03-21 | Discharge: 2017-03-21 | Disposition: A | Discharge status: Home / Self Care | Payer: BLUE CROSS/BLUE SHIELD | Source: Ambulatory Visit | Attending: General Surgery | Admitting: General Surgery

## 2017-03-21 ENCOUNTER — Ambulatory Visit: Payer: BLUE CROSS/BLUE SHIELD | Admitting: Anesthesiology

## 2017-03-21 ENCOUNTER — Encounter: Payer: Self-pay | Admitting: *Deleted

## 2017-03-21 DIAGNOSIS — Z86010 Personal history of colonic polyps: Secondary | ICD-10-CM | POA: Diagnosis not present

## 2017-03-21 DIAGNOSIS — I10 Essential (primary) hypertension: Secondary | ICD-10-CM | POA: Diagnosis not present

## 2017-03-21 DIAGNOSIS — Z8719 Personal history of other diseases of the digestive system: Secondary | ICD-10-CM | POA: Diagnosis not present

## 2017-03-21 DIAGNOSIS — Z79899 Other long term (current) drug therapy: Secondary | ICD-10-CM | POA: Insufficient documentation

## 2017-03-21 DIAGNOSIS — J45909 Unspecified asthma, uncomplicated: Secondary | ICD-10-CM | POA: Diagnosis not present

## 2017-03-21 DIAGNOSIS — E7800 Pure hypercholesterolemia, unspecified: Secondary | ICD-10-CM | POA: Insufficient documentation

## 2017-03-21 DIAGNOSIS — D123 Benign neoplasm of transverse colon: Secondary | ICD-10-CM | POA: Diagnosis not present

## 2017-03-21 DIAGNOSIS — Z09 Encounter for follow-up examination after completed treatment for conditions other than malignant neoplasm: Secondary | ICD-10-CM | POA: Diagnosis present

## 2017-03-21 DIAGNOSIS — D125 Benign neoplasm of sigmoid colon: Secondary | ICD-10-CM | POA: Insufficient documentation

## 2017-03-21 DIAGNOSIS — K635 Polyp of colon: Secondary | ICD-10-CM | POA: Diagnosis not present

## 2017-03-21 DIAGNOSIS — K219 Gastro-esophageal reflux disease without esophagitis: Secondary | ICD-10-CM | POA: Diagnosis not present

## 2017-03-21 HISTORY — PX: COLONOSCOPY WITH PROPOFOL: SHX5780

## 2017-03-21 SURGERY — COLONOSCOPY WITH PROPOFOL
Anesthesia: General

## 2017-03-21 MED ORDER — PROPOFOL 10 MG/ML IV BOLUS
INTRAVENOUS | Status: AC
  Filled 2017-03-21: qty 20

## 2017-03-21 MED ORDER — EPHEDRINE SULFATE 50 MG/ML IJ SOLN
INTRAMUSCULAR | Status: DC | PRN
  Administered 2017-03-21 (×3): 50 mg via INTRAVENOUS

## 2017-03-21 MED ORDER — LIDOCAINE HCL (CARDIAC) 20 MG/ML IV SOLN
INTRAVENOUS | Status: DC | PRN
  Administered 2017-03-21: 3 mL via INTRAVENOUS

## 2017-03-21 MED ORDER — EPHEDRINE SULFATE 50 MG/ML IJ SOLN
INTRAMUSCULAR | Status: AC
  Filled 2017-03-21: qty 1

## 2017-03-21 MED ORDER — PROPOFOL 500 MG/50ML IV EMUL
INTRAVENOUS | Status: DC | PRN
  Administered 2017-03-21: 120 ug/kg/min via INTRAVENOUS

## 2017-03-21 MED ORDER — PROPOFOL 10 MG/ML IV BOLUS
INTRAVENOUS | Status: DC | PRN
  Administered 2017-03-21: 20 mg via INTRAVENOUS
  Administered 2017-03-21: 40 mg via INTRAVENOUS

## 2017-03-21 MED ORDER — LIDOCAINE HCL (PF) 2 % IJ SOLN
INTRAMUSCULAR | Status: AC
  Filled 2017-03-21: qty 2

## 2017-03-21 MED ORDER — SODIUM CHLORIDE 0.9 % IV SOLN
INTRAVENOUS | Status: DC
  Administered 2017-03-21: 1000 mL via INTRAVENOUS

## 2017-03-21 NOTE — Anesthesia Post-op Follow-up Note (Signed)
Anesthesia QCDR form completed.        

## 2017-03-21 NOTE — Interval H&P Note (Signed)
History and Physical Interval Note:  03/21/2017 8:31 AM  Gabrielle Terry  has presented today for surgery, with the diagnosis of HX COLON POLYP  The various methods of treatment have been discussed with the patient and family. After consideration of risks, benefits and other options for treatment, the patient has consented to  Procedure(s): COLONOSCOPY WITH PROPOFOL (N/A) as a surgical intervention .  The patient's history has been reviewed, patient examined, no change in status, stable for surgery.  I have reviewed the patient's chart and labs.  Questions were answered to the patient's satisfaction.     [name] G

## 2017-03-21 NOTE — Transfer of Care (Signed)
Immediate Anesthesia Transfer of Care Note  Patient: Gabrielle Terry  Procedure(s) Performed: Procedure(s): COLONOSCOPY WITH PROPOFOL (N/A)  Patient Location: PACU  Anesthesia Type:General  Level of Consciousness: awake and alert   Airway & Oxygen Therapy: Patient Spontanous Breathing and Patient connected to nasal cannula oxygen  Post-op Assessment: Report given to RN and Post -op Vital signs reviewed and stable  Post vital signs: Reviewed  Last Vitals:  Vitals:   03/21/17 0806  BP: 102/76  Pulse: 68  Resp: 18  Temp: 36.6 C  SpO2: 97%    Last Pain:  Vitals:   03/21/17 0806  TempSrc: Tympanic         Complications: No apparent anesthesia complications

## 2017-03-21 NOTE — H&P (Signed)
Gabrielle Terry is an 56 y.o. female.   Chief Complaint: here for colonoscopy HPI: 56 yr old female with past history of colon polyp. lasr colonoscopy in 2013. Here for surveillance colonoscopy. Ni GI complaints  Past Medical History:  Diagnosis Date  . Arthritis    knees  . Asthma 2009  . Breast hematoma 2011   Right  . Colon polyp 2013   3  . Cough    from sinus drainage  . Diffuse cystic mastopathy   . Foot cramps   . GERD (gastroesophageal reflux disease)   . Hypercholesteremia   . Hypertension   . Ulcer     Past Surgical History:  Procedure Laterality Date  . ABDOMINAL HYSTERECTOMY  1997  . BREAST SURGERY Right 2011   hematoma  . [place]  . COLONOSCOPY  2013   3 polyps/ Dr Jamal Collin  . ESOPHAGOGASTRODUODENOSCOPY (EGD) WITH PROPOFOL N/A 01/26/2016   Procedure: ESOPHAGOGASTRODUODENOSCOPY (EGD) WITH PROPOFOL;  Surgeon: Manya Silvas, MD;  Location: [facility] ENDOSCOPY;  Service: Endoscopy;  Laterality: N/A;  . ETHMOIDECTOMY Bilateral 04/29/2015   Procedure: TOTAL ETHMOIDECTOMY;  Surgeon: Margaretha Sheffield, MD;  Location: [place];  Service: ENT;  Laterality: Bilateral;  . FRONTAL SINUS EXPLORATION Bilateral 04/29/2015   Procedure: FRONTAL SINUS EXPLORATION;  Surgeon: Margaretha Sheffield, MD;  Location: [place];  Service: ENT;  Laterality: Bilateral;  . IMAGE GUIDED SINUS SURGERY N/A 04/29/2015   Procedure: IMAGE GUIDED SINUS SURGERY;  Surgeon: Margaretha Sheffield, MD;  Location: [place];  Service: ENT;  Laterality: N/A;  GAVE DISK TO CE CE  . MAXILLARY ANTROSTOMY Bilateral 04/29/2015   Procedure: MAXILLARY ANTROSTOMY WITH REMOVAL OF CONTENTS;  Surgeon: Margaretha Sheffield, MD;  Location: [place];  Service: ENT;  Laterality: Bilateral;  . MYOMECTOMY  1991  . NASAL SEPTUM SURGERY  2013  . salpingo oophorectmy  1997  . SPHENOIDECTOMY Bilateral 04/29/2015   Procedure: SPHENOIDECTOMY;  Surgeon: Margaretha Sheffield, MD;  Location: [place];   Service: ENT;  Laterality: Bilateral;  . TUBAL LIGATION    . UTERINE FIBROID SURGERY     removed    Family History  Problem Relation Age of Onset  . Adopted: Yes  . Congestive Heart Failure Father   . Cancer Brother   . Other Brother        brain tumor  . Hypertension Maternal Aunt   . Thyroid disease Maternal Aunt   . Diabetes Maternal Grandmother    Social History:  reports that she has never smoked. She has never used smokeless tobacco. She reports that she drinks about 0.6 oz of alcohol per week . She reports that she does not use drugs.  Allergies: No Known Allergies  Medications Prior to Admission  Medication Sig Dispense Refill  . ADVAIR HFA 230-21 MCG/ACT inhaler INHALE 2 PUFFS INTO THE LUNGS TWICE DAILY 12 g 11  . amLODipine (NORVASC) 2.5 MG tablet TAKE 1 TABLET BY MOUTH EVERY DAY 30 tablet 11  . losartan-hydrochlorothiazide (HYZAAR) 100-25 MG tablet TAKE 1 TABLET BY MOUTH DAILY 30 tablet 12  . lovastatin (MEVACOR) 20 MG tablet Take 1 tablet (20 mg total) by mouth daily. 30 tablet 12  . montelukast (SINGULAIR) 10 MG tablet TAKE 1 TABLET(10 MG) BY MOUTH AT BEDTIME 30 tablet 11  . pantoprazole (PROTONIX) 40 MG tablet   3  . sucralfate (CARAFATE) 1 g tablet TAKE 1 TABLET(1 GRAM) BY MOUTH FOUR TIMES DAILY AT BEDTIME WITH MEALS 120 tablet 11  .  fluticasone (FLONASE) 50 MCG/ACT nasal spray Place 2 sprays into both nostrils daily. 16 g 6  . polyethylene glycol (MIRALAX / GLYCOLAX) packet Take 17 g by mouth daily.    . polyethylene glycol powder (GLYCOLAX/MIRALAX) powder 255 grams one bottle for colonoscopy prep 255 g 0  . PROAIR HFA 108 (90 Base) MCG/ACT inhaler INHALE 1 TO 2 PUFFS INTO THE LUNGS EVERY 4 HOURS AS NEEDED FOR WHEEZING 8 g 11    No results found for this or any previous visit (from the past 37 hour(s)). No results found.  Review of Systems  Constitutional: Negative.   Respiratory: Negative.   Cardiovascular: Negative.   Gastrointestinal: Negative.    Genitourinary: Negative.   Neurological: Headaches: here for colonoscopy.    Blood pressure 102/76, pulse 68, temperature 97.8 F (36.6 C), temperature source Tympanic, resp. rate 18, height 5\' 5"  (1.651 m), weight 203 lb (92.1 kg), SpO2 97 %. Physical Exam  Constitutional: She is oriented to person, place, and time. She appears well-developed and well-nourished.  Eyes: Conjunctivae are normal. No scleral icterus.  Neck: Neck supple.  Cardiovascular: Normal rate, regular rhythm and normal heart sounds.   Respiratory: Effort normal.  GI: Soft. Bowel sounds are normal. She exhibits no mass. There is no tenderness.  Lymphadenopathy:    She has no cervical adenopathy.  Neurological: She is alert and oriented to person, place, and time.  Skin: Skin is warm and dry.     Assessment/Plan History of colon polyp. Proceed with planned colonoscopy  Christene Lye, MD 03/21/2017, 8:26 AM

## 2017-03-21 NOTE — Anesthesia Preprocedure Evaluation (Signed)
Anesthesia Evaluation  Patient identified by MRN, date of birth, ID band Patient awake    Reviewed: Allergy & Precautions, NPO status , Patient's Chart, lab work & pertinent test results  Airway Mallampati: II       Dental  (+) Teeth Intact   Pulmonary asthma ,     + decreased breath sounds      Cardiovascular Exercise Tolerance: Good hypertension, Pt. on medications  Rhythm:Regular     Neuro/Psych negative neurological ROS  negative psych ROS   GI/Hepatic Neg liver ROS, GERD  Medicated,  Endo/Other  negative endocrine ROS  Renal/GU negative Renal ROS     Musculoskeletal   Abdominal (+) + obese,   Peds negative pediatric ROS (+)  Hematology negative hematology ROS (+)   Anesthesia Other Findings   Reproductive/Obstetrics                             Anesthesia Physical Anesthesia Plan  ASA: II  Anesthesia Plan: General   Post-op Pain Management:    Induction: Intravenous  PONV Risk Score and Plan: 0  Airway Management Planned: Natural Airway and Nasal Cannula  Additional Equipment:   Intra-op Plan:   Post-operative Plan:   Informed Consent: I have reviewed the patients History and Physical, chart, labs and discussed the procedure including the risks, benefits and alternatives for the proposed anesthesia with the patient or authorized representative who has indicated his/her understanding and acceptance.     Plan Discussed with: Surgeon  Anesthesia Plan Comments:         Anesthesia Quick Evaluation

## 2017-03-21 NOTE — Op Note (Signed)
[facility] Patient Name: Gabrielle Terry Procedure Date: 03/21/2017 8:38 AM MRN: [id number] Account #: [id number] Date of Birth: 03-21-1961 Admit Type: Outpatient Age: 56 Room: [facility] ENDO ROOM 1 Gender: Female Note Status: Finalized Procedure:            Colonoscopy Indications:          High risk colon cancer surveillance: Personal history                        of colonic polyps Providers:             G. Jamal Collin, MD Referring MD:         Janine Ores. Rosanna Randy, MD (Referring MD) Medicines:            Total IV Anesthesia (TIVA) Complications:        No immediate complications. Procedure:            Pre-Anesthesia Assessment:                       - Using IV propofol under the supervision of an                        anesthesiologist was determined to be medically                        necessary for this procedure based on review of the                        patient's medical history, medications, and prior                        anesthesia history.                       After obtaining informed consent, the colonoscope was                        passed under direct vision. Throughout the procedure,                        the patient's blood pressure, pulse, and oxygen                        saturations were monitored continuously. The                        Colonoscope was introduced through the anus and                        advanced to the the cecum, identified by the ileocecal                        valve. The colonoscopy was performed without                        difficulty. The patient tolerated the procedure well.                        The quality of the bowel preparation was good. Findings:      The perianal and digital rectal examinations were normal.  A 5 mm polyp was found in the proximal sigmoid colon. The polyp was       sessile. The polyp was removed with a cold snare. Resection and       retrieval were complete.  A 3 mm polyp was found in the transverse colon. The polyp was sessile.       The polyp was removed with a cold snare. Resection and retrieval were       complete.      The exam was otherwise without abnormality on direct and retroflexion       views. Impression:           - One 5 mm polyp in the proximal sigmoid colon, removed                        with a cold snare. Resected and retrieved.                       - One 3 mm polyp in the transverse colon, removed with                        a cold snare. Resected and retrieved.                       - The examination was otherwise normal on direct and                        retroflexion views. Recommendation:       - Discharge patient to home.                       - Resume previous diet.                       - Continue present medications.                       - Await pathology results.                       - Repeat colonoscopy in 5 years for surveillance. Procedure Code(s):    --- Professional ---                       [phone], Colonoscopy, flexible; with removal of tumor(s),                        polyp(s), or other lesion(s) by snare technique Diagnosis Code(s):    --- Professional ---                       Z86.010, Personal history of colonic polyps                       D12.5, Benign neoplasm of sigmoid colon                       D12.3, Benign neoplasm of transverse colon (hepatic                        flexure or splenic flexure) CPT copyright 2016 American Medical Association. All rights reserved. The codes documented in this report are preliminary and upon coder review  may  be revised to meet current compliance requirements. Christene Lye, MD 03/21/2017 9:19:32 AM This report has been signed electronically. Number of Addenda: 0 Note Initiated On: 03/21/2017 8:38 AM Scope Withdrawal Time: 0 hours 8 minutes 23 seconds  Total Procedure Duration: 0 hours 31 minutes 49 seconds       [facility]

## 2017-03-21 NOTE — Anesthesia Postprocedure Evaluation (Signed)
Anesthesia Post Note  Patient: Gabrielle Terry  Procedure(s) Performed: Procedure(s) (LRB): COLONOSCOPY WITH PROPOFOL (N/A)  Patient location during evaluation: PACU Anesthesia Type: General Level of consciousness: awake Pain management: pain level controlled Vital Signs Assessment: post-procedure vital signs reviewed and stable Respiratory status: spontaneous breathing Cardiovascular status: stable Anesthetic complications: no     Last Vitals:  Vitals:   03/21/17 0806 03/21/17 0920  BP: 102/76 (!) 99/49  Pulse: 68 65  Resp: 18 18  Temp: 36.6 C (!) 35.7 C  SpO2: 97% 100%    Last Pain:  Vitals:   03/21/17 0806  TempSrc: Tympanic                 VAN STAVEREN,Joshau Code

## 2017-03-21 NOTE — Interval H&P Note (Signed)
History and Physical Interval Note:  03/21/2017 8:30 AM  Gabrielle Terry  has presented today for surgery, with the diagnosis of HX COLON POLYP  The various methods of treatment have been discussed with the patient and family. After consideration of risks, benefits and other options for treatment, the patient has consented to  Procedure(s): COLONOSCOPY WITH PROPOFOL (N/A) as a surgical intervention .  The patient's history has been reviewed, patient examined, no change in status, stable for surgery.  I have reviewed the patient's chart and labs.  Questions were answered to the patient's satisfaction.     SANKAR,SEEPLAPUTHUR G

## 2017-03-22 LAB — SURGICAL PATHOLOGY

## 2017-03-23 ENCOUNTER — Telehealth: Payer: Self-pay

## 2017-03-23 NOTE — Telephone Encounter (Signed)
-----   Message from Christene Lye, MD sent at 03/22/2017  4:53 PM EDT ----- [name] please inform pt- polyp was benign. Thanks

## 2017-03-23 NOTE — Telephone Encounter (Signed)
Notified patient as instructed, patient pleased. Discussed follow-up appointments, patient agrees  

## 2017-04-25 ENCOUNTER — Ambulatory Visit: Payer: BLUE CROSS/BLUE SHIELD | Admitting: Allergy and Immunology

## 2017-05-01 ENCOUNTER — Ambulatory Visit (INDEPENDENT_AMBULATORY_CARE_PROVIDER_SITE_OTHER): Payer: BLUE CROSS/BLUE SHIELD | Admitting: Allergy and Immunology

## 2017-05-01 ENCOUNTER — Encounter: Payer: Self-pay | Admitting: Allergy and Immunology

## 2017-05-01 VITALS — BP 100/74 | HR 72 | Temp 98.1°F | Resp 20 | Ht 65.5 in | Wt 212.6 lb

## 2017-05-01 DIAGNOSIS — D721 Eosinophilia, unspecified: Secondary | ICD-10-CM

## 2017-05-01 DIAGNOSIS — R442 Other hallucinations: Secondary | ICD-10-CM | POA: Diagnosis not present

## 2017-05-01 DIAGNOSIS — K219 Gastro-esophageal reflux disease without esophagitis: Secondary | ICD-10-CM

## 2017-05-01 DIAGNOSIS — J324 Chronic pansinusitis: Secondary | ICD-10-CM

## 2017-05-01 DIAGNOSIS — R430 Anosmia: Secondary | ICD-10-CM | POA: Diagnosis not present

## 2017-05-01 DIAGNOSIS — J3089 Other allergic rhinitis: Secondary | ICD-10-CM | POA: Diagnosis not present

## 2017-05-01 DIAGNOSIS — J4540 Moderate persistent asthma, uncomplicated: Secondary | ICD-10-CM

## 2017-05-01 MED ORDER — METHYLPREDNISOLONE ACETATE 80 MG/ML IJ SUSP
80.0000 mg | Freq: Once | INTRAMUSCULAR | Status: AC
  Administered 2017-05-01: 80 mg via INTRAMUSCULAR

## 2017-05-01 MED ORDER — DEXLANSOPRAZOLE 60 MG PO CPDR: 60.0000 mg | DELAYED_RELEASE_CAPSULE | ORAL | 5 refills | Status: DC

## 2017-05-01 MED ORDER — AMOXICILLIN-POT CLAVULANATE 875-125 MG PO TABS: ORAL_TABLET | ORAL | 0 refills | Status: DC

## 2017-05-01 MED ORDER — RANITIDINE HCL 300 MG PO TABS: 300.0000 mg | ORAL_TABLET | Freq: Every day | ORAL | 5 refills | Status: DC

## 2017-05-01 NOTE — Patient Instructions (Addendum)
  1. Allergen avoidance measures  2. Treat and prevent inflammation:   A. continue Advair 230 - 2 inhalations twice a day with spacer  B. continue nasal fluticasone one spray each nostril twice a day  C. Depo-Medrol 80 IM delivered in clinic  3. Treat and prevent reflux:   A. minimize all forms of caffeine consumption  B. Dexilant 60 mg one tablet in a.m.  C. Ranitidine 300 mg one tablet in PM  4. Treat infection:   A. Augmentin 875 one tablet twice a day for 20 days  5. If needed:   A. ProAir HFA or similar 2 inhalations every 4-6 hours  B. nasal saline  C. OTC antihistamine  6. Return to clinic in 3 weeks or earlier if problem  7. Obtain fall flu vaccine

## 2017-05-01 NOTE — Progress Notes (Signed)
Dear Dr. Rosanna Randy,  Thank you for referring Gabrielle Terry to the [facility] on 05/01/2017.   Below is a summation of this patient's evaluation and recommendations.  Thank you for your referral. I will keep you informed about this patient's response to treatment.   If you have any questions please do not hesitate to contact me.   Sincerely,  Jiles Prows, MD Allergy / Immunology [facility]   ______________________________________________________________________    NEW PATIENT NOTE  Referring Provider: Jerrol Banana.,* Primary Provider: Jerrol Banana., MD Date of office visit: 05/01/2017    Subjective:   Chief Complaint:  Gabrielle Terry (DOB: 1961-07-04) is a 56 y.o. female who presents to the clinic on 05/01/2017 with a chief complaint of Asthma .     HPI: Gabrielle Terry presents to this clinic in evaluation of persistent respiratory tract symptoms.  She has a long history of asthma dating back over 10 years and she develops intermittent bouts of coughing and she is presently stuck in one of her bouts that has been occurring over the course of the past 1-2 months. She is coughing and wheezing and has clear sputum production. In addition she has anosmia and has funny smells that no one else can smell. She does not have any associated headaches or recurrent fevers. She does have a history of exercise-induced shortness of breath for which she will use a short acting bronchodilator prior to the performance of exercise. She has received a systemic steroid early in 2018 for an issue tied up with her asthma. All these symptoms occur even though she continues to use Advair and montelukast and nasal fluticasone consistently.  She has a long history of allergic rhinoconjunctivitis with nasal congestion and sneezing and some itchy watery eyes especially following exposure to the outdoors and  especially following exposure to grasses. It appears as though strong fumes such as perfumes also precipitate this issue.  In addition she has reflux with lots of throat clearing and postnasal drip and intermittent raspy voice and burping and belching. She drinks 2 caffeinated drinks per day and has a soda once a week and chocolate twice a week and does not consume any alcohol or smoke any tobacco. She continues with these symptoms even though she is using Carafate daily.  Past Medical History:  Diagnosis Date  . Arthritis    knees  . Asthma 2009  . Breast hematoma 2011   Right  . Colon polyp 2013   3  . Cough    from sinus drainage  . Diffuse cystic mastopathy   . Foot cramps   . GERD (gastroesophageal reflux disease)   . Hypercholesteremia   . Hypertension   . Ulcer     Past Surgical History:  Procedure Laterality Date  . ABDOMINAL HYSTERECTOMY  1997  . BREAST SURGERY Right 2011   hematoma  . [place]  . COLONOSCOPY  2013   3 polyps/ Dr Jamal Collin  . COLONOSCOPY WITH PROPOFOL N/A 03/21/2017   Procedure: COLONOSCOPY WITH PROPOFOL;  Surgeon: Christene Lye, MD;  Location: [facility] ENDOSCOPY;  Service: Endoscopy;  Laterality: N/A;  . ESOPHAGOGASTRODUODENOSCOPY (EGD) WITH PROPOFOL N/A 01/26/2016   Procedure: ESOPHAGOGASTRODUODENOSCOPY (EGD) WITH PROPOFOL;  Surgeon: Manya Silvas, MD;  Location: [facility] ENDOSCOPY;  Service: Endoscopy;  Laterality: N/A;  . ETHMOIDECTOMY Bilateral 04/29/2015   Procedure: TOTAL ETHMOIDECTOMY;  Surgeon: Eddie Dibbles  Kathyrn Sheriff, MD;  Location: [place];  Service: ENT;  Laterality: Bilateral;  . FRONTAL SINUS EXPLORATION Bilateral 04/29/2015   Procedure: FRONTAL SINUS EXPLORATION;  Surgeon: Margaretha Sheffield, MD;  Location: [place];  Service: ENT;  Laterality: Bilateral;  . IMAGE GUIDED SINUS SURGERY N/A 04/29/2015   Procedure: IMAGE GUIDED SINUS SURGERY;  Surgeon: Margaretha Sheffield, MD;  Location: [place];  Service: ENT;   Laterality: N/A;  GAVE DISK TO CE CE  . MAXILLARY ANTROSTOMY Bilateral 04/29/2015   Procedure: MAXILLARY ANTROSTOMY WITH REMOVAL OF CONTENTS;  Surgeon: Margaretha Sheffield, MD;  Location: [place];  Service: ENT;  Laterality: Bilateral;  . MYOMECTOMY  1991  . NASAL SEPTUM SURGERY  2013  . salpingo oophorectmy  1997  . SPHENOIDECTOMY Bilateral 04/29/2015   Procedure: SPHENOIDECTOMY;  Surgeon: Margaretha Sheffield, MD;  Location: [place];  Service: ENT;  Laterality: Bilateral;  . TUBAL LIGATION    . UTERINE FIBROID SURGERY     removed    Allergies as of 05/01/2017   No Known Allergies     Medication List      ADVAIR HFA 230-21 MCG/ACT inhaler Generic drug:  fluticasone-salmeterol INHALE 2 PUFFS INTO THE LUNGS TWICE DAILY   amLODipine 2.5 MG tablet Commonly known as:  NORVASC TAKE 1 TABLET BY MOUTH EVERY DAY   fluticasone 50 MCG/ACT nasal spray Commonly known as:  FLONASE Place 2 sprays into both nostrils daily.   losartan-hydrochlorothiazide 100-25 MG tablet Commonly known as:  HYZAAR TAKE 1 TABLET BY MOUTH DAILY   lovastatin 20 MG tablet Commonly known as:  MEVACOR Take 1 tablet (20 mg total) by mouth daily.   montelukast 10 MG tablet Commonly known as:  SINGULAIR TAKE 1 TABLET(10 MG) BY MOUTH AT BEDTIME   polyethylene glycol packet Commonly known as:  MIRALAX / GLYCOLAX Take 17 g by mouth daily.   PROAIR HFA 108 (90 Base) MCG/ACT inhaler Generic drug:  albuterol INHALE 1 TO 2 PUFFS INTO THE LUNGS EVERY 4 HOURS AS NEEDED FOR WHEEZING   sucralfate 1 g tablet Commonly known as:  CARAFATE TAKE 1 TABLET(1 GRAM) BY MOUTH FOUR TIMES DAILY AT BEDTIME WITH MEALS       Review of systems negative except as noted in HPI / PMHx or noted below:  Review of Systems  Constitutional: Negative.   HENT: Negative.   Eyes: Negative.   Respiratory: Negative.   Cardiovascular: Negative.   Gastrointestinal: Negative.   Genitourinary: Negative.   Musculoskeletal:  Negative.   Skin: Negative.   Neurological: Negative.   Endo/Heme/Allergies: Negative.   Psychiatric/Behavioral: Negative.     Family History  Problem Relation Age of Onset  . Adopted: Yes  . Congestive Heart Failure Father   . Cancer Brother   . Other Brother        brain tumor  . Hypertension Maternal Aunt   . Thyroid disease Maternal Aunt   . Diabetes Maternal Grandmother   . Asthma Son     Social History   Social History  . Marital status: Single    Spouse name: N/A  . Number of children: N/A  . Years of education: N/A   Occupational History  . Not on file.   Social History Main Topics  . Smoking status: Never Smoker  . Smokeless tobacco: Never Used  . Alcohol use No  . Drug use: No  . Sexual activity: Not on file   Other Topics Concern  . Not on file   Social History  Narrative  . No narrative on file    Environmental and Social history  Lives in a house with a dry environment, no animals located inside the household, carpeting in the bedroom, no plastic on the bed, no plastic on the pillow, and no smoking ongoing with inside the household. She works in an office setting.  Objective:   Vitals:   05/01/17 0829  BP: 100/74  Pulse: 72  Resp: 20  Temp: 98.1 F (36.7 C)   Height: 5' 5.5" (166.4 cm) Weight: 212 lb 9.6 oz (96.4 kg)  Physical Exam  Constitutional: She is well-developed, well-nourished, and in no distress.  Slightly nasal voice, slight throat clearing, slight cough  HENT:  Head: Normocephalic. Head is without right periorbital erythema and without left periorbital erythema.  Right Ear: Tympanic membrane, external ear and ear canal normal.  Left Ear: Tympanic membrane, external ear and ear canal normal.  Nose: Nose normal. No mucosal edema or rhinorrhea.  Mouth/Throat: Uvula is midline, oropharynx is clear and moist and mucous membranes are normal. No oropharyngeal exudate.  Eyes: Pupils are equal, round, and reactive to light.  Conjunctivae and lids are normal.  Neck: Trachea normal. No tracheal tenderness present. No tracheal deviation present. No thyromegaly present.  Cardiovascular: Normal rate, regular rhythm, S1 normal, S2 normal and normal heart sounds.   No murmur heard. Pulmonary/Chest: Effort normal and breath sounds normal. No stridor. No tachypnea. No respiratory distress. She has no wheezes. She has no rales. She exhibits no tenderness.  Abdominal: Soft. She exhibits no distension and no mass. There is no hepatosplenomegaly. There is no tenderness. There is no rebound and no guarding.  Musculoskeletal: She exhibits no edema or tenderness.  Lymphadenopathy:       Head (right side): No tonsillar adenopathy present.       Head (left side): No tonsillar adenopathy present.    She has no cervical adenopathy.    She has no axillary adenopathy.  Neurological: She is alert. Gait normal.  Skin: No rash noted. She is not diaphoretic. No erythema. No pallor. Nails show no clubbing.  Psychiatric: Mood and affect normal.    Diagnostics: Allergy skin tests were performed. She demonstrated hypersensitivity to grasses, weeds, trees, house dust mite, and mold  Spirometry was performed and demonstrated an FEV1 of 1.74 @ 72 % of predicted. Following the administration of nebulized albuterol her FEV1 rose to 1.85 which was an increase in the FEV1 of 6%.  Results of blood tests obtained 11/16/2015 identifies white blood cell count 7.6, absolute eosinophils 700, absolute lymphocyte 2300, hemoglobin 14, platelet 298.  Results of a chest x-ray obtained 04/08/2013 identifies no significant abnormality.  Assessment and Plan:    1. Not well controlled moderate persistent asthma   2. Other allergic rhinitis   3. Chronic pansinusitis   4. LPRD (laryngopharyngeal reflux disease)   5. Eosinophilia   6. Anosmia   7. Phantosmia     1. Allergen avoidance measures  2. Treat and prevent inflammation:   A. continue Advair  230 - 2 inhalations twice a day with spacer  B. continue nasal fluticasone one spray each nostril twice a day  C. Depo-Medrol 80 IM delivered in clinic  3. Treat and prevent reflux:   A. minimize all forms of caffeine consumption  B. Dexilant 60 mg one tablet in a.m.  C. Ranitidine 300 mg one tablet in PM  4. Treat infection:   A. Augmentin 875 one tablet twice a day for 20 days  5. If needed:   A. ProAir HFA or similar 2 inhalations every 4-6 hours  B. nasal saline  C. OTC antihistamine  6. Return to clinic in 3 weeks or earlier if problem  7. Obtain fall flu vaccine  Gabrielle Terry appears to have 2 insults to her respiratory tract. She has atopic respiratory disease that hopefully will respond to a combination of allergen avoidance measures and the consistent use of anti-inflammatory medications as noted above. If she fails this form of therapy she would be a candidate for immunotherapy and possibly a biological agent given her eosinophilia. She also appears to have an insult of reflux-induced respiratory disease for which she will utilize the therapy noted above. Unfortunately her history of anosmia and phantosmia suggest that she has slipped over into a component of chronic sinusitis for which we will give her antibiotics for the next 20 days. I will regroup with her in 3 weeks or earlier if there is a problem and at that point we will consider further evaluation and treatment pending her response.  Jiles Prows, MD Allergy / Immunology [facility]

## 2017-05-29 ENCOUNTER — Ambulatory Visit: Payer: BLUE CROSS/BLUE SHIELD | Admitting: Allergy and Immunology

## 2017-05-29 ENCOUNTER — Encounter: Payer: Self-pay | Admitting: Allergy and Immunology

## 2017-05-29 VITALS — BP 122/84 | HR 84 | Resp 16

## 2017-05-29 DIAGNOSIS — J324 Chronic pansinusitis: Secondary | ICD-10-CM

## 2017-05-29 DIAGNOSIS — K219 Gastro-esophageal reflux disease without esophagitis: Secondary | ICD-10-CM | POA: Diagnosis not present

## 2017-05-29 DIAGNOSIS — D721 Eosinophilia, unspecified: Secondary | ICD-10-CM

## 2017-05-29 DIAGNOSIS — J3089 Other allergic rhinitis: Secondary | ICD-10-CM | POA: Diagnosis not present

## 2017-05-29 DIAGNOSIS — T63481D Toxic effect of venom of other arthropod, accidental (unintentional), subsequent encounter: Secondary | ICD-10-CM | POA: Diagnosis not present

## 2017-05-29 DIAGNOSIS — J4550 Severe persistent asthma, uncomplicated: Secondary | ICD-10-CM | POA: Diagnosis not present

## 2017-05-29 DIAGNOSIS — T782XXD Anaphylactic shock, unspecified, subsequent encounter: Secondary | ICD-10-CM

## 2017-05-29 MED ORDER — AUVI-Q 0.3 MG/0.3ML IJ SOAJ: INTRAMUSCULAR | 3 refills | Status: DC

## 2017-05-29 NOTE — Patient Instructions (Addendum)
  1. Allergen avoidance measures  2. Continue to Treat and prevent inflammation:   A. continue Advair 230 - 2 inhalations twice a day with spacer  B. continue nasal fluticasone one spray each nostril twice a day    3. Continue to Treat and prevent reflux:   A. minimize all forms of caffeine consumption  B. Dexilant 60 mg one tablet in a.m.  C. Ranitidine 300 mg one tablet in PM  4. If needed:   A. ProAir HFA or similar 2 inhalations every 4-6 hours  B. nasal saline  C. OTC antihistamine  D. Auvi-Q 0.3  5. Return tomorrow morning at 830 am to receive Benralizumab sample and then monthly injections  6. Return to clinic in christmas 2018 or earlier if problem  7. Blood - hymenoptera panel, ANCA w/reflex

## 2017-05-29 NOTE — Progress Notes (Signed)
Follow-up Note  Referring Provider: Jerrol Banana.,* Primary Provider: Jerrol Banana., MD Date of Office Visit: 05/29/2017  Subjective:   Gabrielle Terry (DOB: October 16, 1960) is a 56 y.o. female who returns to the [facility] on 05/29/2017 in re-evaluation of the following:  HPI: Gabrielle Terry returns to this clinic in reevaluation of her respiratory tract disease evaluated during her initial visit of 01 May 2017.  Her asthma is still active.  She is having wheezing and coughing and is using a bronchodilator especially over the course of the past week or so.  Fortunately, her head is better.  She no longer has an anosmia and she no longer had funny smells and her head feels more open.  She has not been having any problems with reflux and her throat is much better with elimination of her throat clearing and postnasal drip and intermittent raspy voice.  She has no burping or belching.  She still continues to drink caffeinated drinks multiple times a day.  In addition, she informs me that at the age of 56 years old she was stung by what she felt was a yellow jacket and developed throat swelling and breathing problems and diffuse urticaria requiring emergency room evaluation.  Allergies as of 05/29/2017   No Known Allergies     Medication List      ADVAIR HFA 230-21 MCG/ACT inhaler Generic drug:  fluticasone-salmeterol INHALE 2 PUFFS INTO THE LUNGS TWICE DAILY   amLODipine 2.5 MG tablet Commonly known as:  NORVASC TAKE 1 TABLET BY MOUTH EVERY DAY   dexlansoprazole 60 MG capsule Commonly known as:  DEXILANT Take 1 capsule (60 mg total) by mouth every morning.   estradiol 1 MG tablet Commonly known as:  ESTRACE Take 1 mg daily by mouth.   fluticasone 50 MCG/ACT nasal spray Commonly known as:  FLONASE Place 2 sprays into both nostrils daily.   losartan-hydrochlorothiazide 100-25 MG tablet Commonly known as:  HYZAAR   lovastatin 20 MG  tablet Commonly known as:  MEVACOR   polyethylene glycol packet Commonly known as:  MIRALAX / GLYCOLAX Take 17 g by mouth daily.   PROAIR HFA 108 (90 Base) MCG/ACT inhaler Generic drug:  albuterol INHALE 1 TO 2 PUFFS INTO THE LUNGS EVERY 4 HOURS AS NEEDED FOR WHEEZING   ranitidine 300 MG tablet Commonly known as:  ZANTAC Take 1 tablet (300 mg total) by mouth at bedtime.       Past Medical History:  Diagnosis Date  . Arthritis    knees  . Asthma 2009  . Breast hematoma 2011   Right  . Colon polyp 2013   3  . Cough    from sinus drainage  . Diffuse cystic mastopathy   . Foot cramps   . GERD (gastroesophageal reflux disease)   . Hypercholesteremia   . Hypertension   . Ulcer     Past Surgical History:  Procedure Laterality Date  . ABDOMINAL HYSTERECTOMY  1997  . BREAST SURGERY Right 2011   hematoma  . [place]  . COLONOSCOPY  2013   3 polyps/ Dr Jamal Collin  . MYOMECTOMY  1991  . NASAL SEPTUM SURGERY  2013  . salpingo oophorectmy  1997  . TUBAL LIGATION    . UTERINE FIBROID SURGERY     removed    Review of systems negative except as noted in HPI / PMHx or noted below:  Review of Systems  Constitutional: Negative.  HENT: Negative.   Eyes: Negative.   Respiratory: Negative.   Cardiovascular: Negative.   Gastrointestinal: Negative.   Genitourinary: Negative.   Musculoskeletal: Negative.   Skin: Negative.   Neurological: Negative.   Endo/Heme/Allergies: Negative.   Psychiatric/Behavioral: Negative.      Objective:   Vitals:   05/29/17 1626  BP: 122/84  Pulse: 84  Resp: 16          Physical Exam  Constitutional: She is well-developed, well-nourished, and in no distress.  HENT:  Head: Normocephalic.  Right Ear: Tympanic membrane, external ear and ear canal normal.  Left Ear: Tympanic membrane, external ear and ear canal normal.  Nose: Nose normal. No mucosal edema or rhinorrhea.  Mouth/Throat: Uvula is midline, oropharynx  is clear and moist and mucous membranes are normal. No oropharyngeal exudate.  Eyes: Conjunctivae are normal.  Neck: Trachea normal. No tracheal tenderness present. No tracheal deviation present. No thyromegaly present.  Cardiovascular: Normal rate, regular rhythm, S1 normal, S2 normal and normal heart sounds.  No murmur heard. Pulmonary/Chest: No stridor. No respiratory distress. She has wheezes (Bilateral expiratory wheezing). She has no rales.  Musculoskeletal: She exhibits no edema.  Lymphadenopathy:       Head (right side): No tonsillar adenopathy present.       Head (left side): No tonsillar adenopathy present.    She has no cervical adenopathy.  Neurological: She is alert. Gait normal.  Skin: No rash noted. She is not diaphoretic. No erythema. Nails show no clubbing.  Psychiatric: Mood and affect normal.    Diagnostics:    Spirometry was performed and demonstrated an FEV1 of 1.43 at 59 % of predicted.  The patient had an Asthma Control Test with the following results: ACT Total Score: 19.    Assessment and Plan:   1. Not well controlled severe persistent asthma   2. Other allergic rhinitis   3. Chronic pansinusitis   4. LPRD (laryngopharyngeal reflux disease)   5. 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 Continue to perform allergen avoidance measures  2. Continue to Treat and prevent inflammation:   A. continue Advair 230 - 2 inhalations twice a day with spacer  B. continue nasal fluticasone one spray each nostril twice a day   3. Continue to Treat and prevent reflux:   A. minimize all forms of caffeine consumption  B. Dexilant 60 mg one tablet in a.m.  C. Ranitidine 300 mg one tablet in PM  4. If needed:   A. ProAir HFA or similar 2 inhalations every 4-6 hours  B. nasal saline  C. OTC antihistamine  D. Auvi-Q 0.3  5. Return tomorrow morning at 830 am to receive Benralizumab sample and  then monthly injections or Benralizumab  6. Return to clinic in christmas 2018 or earlier if problem  7. Blood - hymenoptera panel, ANCA w/reflex  Although Gabrielle Terry has had improvement regarding her upper airway disease and her reflux, her lungs are still inflamed and thus she has failed medical therapy and we will now start her on a biological agent.  She cannot wait in the clinic today to receive her first injection of anti-[place]-5 biological agent and we will get that arranged for tomorrow morning.  I will see her back in this clinic in approximately 8 weeks or earlier if there is a problem.  We will work through the issue about her possible hymenoptera allergy by checking blood tests as noted above  and as well to be complete we will check a ANCA given her multiorgan eosinophilic driven disease.  Allena Katz, MD Allergy / Immunology [place]

## 2017-05-30 ENCOUNTER — Ambulatory Visit (INDEPENDENT_AMBULATORY_CARE_PROVIDER_SITE_OTHER): Payer: BLUE CROSS/BLUE SHIELD

## 2017-05-30 ENCOUNTER — Encounter: Payer: Self-pay | Admitting: Allergy and Immunology

## 2017-05-30 DIAGNOSIS — J4540 Moderate persistent asthma, uncomplicated: Secondary | ICD-10-CM

## 2017-05-30 MED ORDER — BENRALIZUMAB 30 MG/ML ~~LOC~~ SOSY
30.0000 mg | PREFILLED_SYRINGE | SUBCUTANEOUS | Status: DC
  Administered 2017-05-30 – 2019-10-28 (×17): 30 mg via SUBCUTANEOUS

## 2017-05-30 NOTE — Progress Notes (Signed)
Immunotherapy   Patient Details  Name: STORMI VANDEVELDE MRN: [id number] Date of Birth: 02/02/1961  05/30/2017  [name] started injections for  Fasenra  Frequency: every 4 weeks for 3 months the every 8 weeks. Epi-Pen:Prescription for Epi-Pen given Consent signed and patient instructions given.   Jonette Eva 05/30/2017, 9:53 AM

## 2017-06-01 LAB — PAN-ANCA
ANCA Proteinase 3: <3.5 U/mL (ref 0.0–3.5)
Atypical pANCA: <1:20 {titer}
MYELOPEROXIDASE AB: 12.4 U/mL — AB (ref 0.0–9.0)

## 2017-06-01 LAB — ALLERGEN HYMENOPTERA PANEL
Honeybee IgE: 0.78 kU/L — AB
Hornet, White Face, IgE: 0.44 kU/L — AB
I003-IGE YELLOW JACKET: 0.33 kU/L — AB
I005-IGE HORNET, YELLOW: 0.26 kU/L — AB
I205-IGE BUMBLEBEE: 0.23 kU/L — AB
Paper Wasp IgE: 0.25 kU/L — AB

## 2017-06-28 ENCOUNTER — Ambulatory Visit: Payer: Self-pay

## 2017-06-28 DIAGNOSIS — J4550 Severe persistent asthma, uncomplicated: Secondary | ICD-10-CM | POA: Diagnosis not present

## 2017-07-19 ENCOUNTER — Ambulatory Visit (INDEPENDENT_AMBULATORY_CARE_PROVIDER_SITE_OTHER): Payer: BLUE CROSS/BLUE SHIELD | Admitting: *Deleted

## 2017-07-19 DIAGNOSIS — J4550 Severe persistent asthma, uncomplicated: Secondary | ICD-10-CM | POA: Diagnosis not present

## 2017-07-25 DIAGNOSIS — M9903 Segmental and somatic dysfunction of lumbar region: Secondary | ICD-10-CM | POA: Diagnosis not present

## 2017-07-25 DIAGNOSIS — G5721 Lesion of femoral nerve, right lower limb: Secondary | ICD-10-CM | POA: Diagnosis not present

## 2017-07-25 DIAGNOSIS — M5386 Other specified dorsopathies, lumbar region: Secondary | ICD-10-CM | POA: Diagnosis not present

## 2017-07-31 DIAGNOSIS — G5721 Lesion of femoral nerve, right lower limb: Secondary | ICD-10-CM | POA: Diagnosis not present

## 2017-07-31 DIAGNOSIS — M5386 Other specified dorsopathies, lumbar region: Secondary | ICD-10-CM | POA: Diagnosis not present

## 2017-07-31 DIAGNOSIS — M9903 Segmental and somatic dysfunction of lumbar region: Secondary | ICD-10-CM | POA: Diagnosis not present

## 2017-08-07 DIAGNOSIS — M5386 Other specified dorsopathies, lumbar region: Secondary | ICD-10-CM | POA: Diagnosis not present

## 2017-08-07 DIAGNOSIS — M9903 Segmental and somatic dysfunction of lumbar region: Secondary | ICD-10-CM | POA: Diagnosis not present

## 2017-08-08 DIAGNOSIS — M9903 Segmental and somatic dysfunction of lumbar region: Secondary | ICD-10-CM | POA: Diagnosis not present

## 2017-08-08 DIAGNOSIS — M5386 Other specified dorsopathies, lumbar region: Secondary | ICD-10-CM | POA: Diagnosis not present

## 2017-08-08 DIAGNOSIS — N951 Menopausal and female climacteric states: Secondary | ICD-10-CM | POA: Diagnosis not present

## 2017-08-08 DIAGNOSIS — J4550 Severe persistent asthma, uncomplicated: Secondary | ICD-10-CM | POA: Diagnosis not present

## 2017-08-16 ENCOUNTER — Ambulatory Visit: Payer: BLUE CROSS/BLUE SHIELD

## 2017-08-23 ENCOUNTER — Ambulatory Visit (INDEPENDENT_AMBULATORY_CARE_PROVIDER_SITE_OTHER): Payer: BLUE CROSS/BLUE SHIELD | Admitting: *Deleted

## 2017-08-23 DIAGNOSIS — J4540 Moderate persistent asthma, uncomplicated: Secondary | ICD-10-CM

## 2017-08-24 DIAGNOSIS — M9903 Segmental and somatic dysfunction of lumbar region: Secondary | ICD-10-CM | POA: Diagnosis not present

## 2017-08-24 DIAGNOSIS — M5386 Other specified dorsopathies, lumbar region: Secondary | ICD-10-CM | POA: Diagnosis not present

## 2017-08-28 ENCOUNTER — Other Ambulatory Visit: Payer: Self-pay | Admitting: Family Medicine

## 2017-09-28 DIAGNOSIS — M9903 Segmental and somatic dysfunction of lumbar region: Secondary | ICD-10-CM | POA: Diagnosis not present

## 2017-09-28 DIAGNOSIS — M5386 Other specified dorsopathies, lumbar region: Secondary | ICD-10-CM | POA: Diagnosis not present

## 2017-10-02 ENCOUNTER — Other Ambulatory Visit: Payer: Self-pay | Admitting: Family Medicine

## 2017-10-02 DIAGNOSIS — I10 Essential (primary) hypertension: Secondary | ICD-10-CM

## 2017-10-10 DIAGNOSIS — J4550 Severe persistent asthma, uncomplicated: Secondary | ICD-10-CM | POA: Diagnosis not present

## 2017-10-16 ENCOUNTER — Ambulatory Visit: Payer: BLUE CROSS/BLUE SHIELD

## 2017-10-18 ENCOUNTER — Ambulatory Visit: Payer: BLUE CROSS/BLUE SHIELD

## 2017-10-25 ENCOUNTER — Ambulatory Visit (INDEPENDENT_AMBULATORY_CARE_PROVIDER_SITE_OTHER): Payer: BLUE CROSS/BLUE SHIELD | Admitting: *Deleted

## 2017-10-25 DIAGNOSIS — J4550 Severe persistent asthma, uncomplicated: Secondary | ICD-10-CM

## 2017-11-07 ENCOUNTER — Other Ambulatory Visit: Payer: Self-pay | Admitting: Family Medicine

## 2017-11-07 DIAGNOSIS — I10 Essential (primary) hypertension: Secondary | ICD-10-CM

## 2017-12-12 DIAGNOSIS — J4550 Severe persistent asthma, uncomplicated: Secondary | ICD-10-CM | POA: Diagnosis not present

## 2017-12-12 DIAGNOSIS — F430 Acute stress reaction: Secondary | ICD-10-CM | POA: Diagnosis not present

## 2017-12-17 ENCOUNTER — Other Ambulatory Visit: Payer: Self-pay | Admitting: Family Medicine

## 2017-12-17 DIAGNOSIS — I10 Essential (primary) hypertension: Secondary | ICD-10-CM

## 2017-12-20 ENCOUNTER — Ambulatory Visit (INDEPENDENT_AMBULATORY_CARE_PROVIDER_SITE_OTHER): Payer: BLUE CROSS/BLUE SHIELD | Admitting: *Deleted

## 2017-12-20 DIAGNOSIS — J4550 Severe persistent asthma, uncomplicated: Secondary | ICD-10-CM | POA: Diagnosis not present

## 2017-12-25 ENCOUNTER — Other Ambulatory Visit: Payer: Self-pay | Admitting: Family Medicine

## 2017-12-25 DIAGNOSIS — I10 Essential (primary) hypertension: Secondary | ICD-10-CM

## 2017-12-31 ENCOUNTER — Ambulatory Visit: Payer: BLUE CROSS/BLUE SHIELD | Admitting: Family Medicine

## 2017-12-31 VITALS — BP 120/82 | HR 82 | Temp 98.1°F | Resp 16 | Wt 226.0 lb

## 2017-12-31 DIAGNOSIS — I10 Essential (primary) hypertension: Secondary | ICD-10-CM

## 2017-12-31 DIAGNOSIS — Z6837 Body mass index (BMI) 37.0-37.9, adult: Secondary | ICD-10-CM

## 2017-12-31 DIAGNOSIS — E7800 Pure hypercholesterolemia, unspecified: Secondary | ICD-10-CM

## 2017-12-31 DIAGNOSIS — J4520 Mild intermittent asthma, uncomplicated: Secondary | ICD-10-CM

## 2017-12-31 DIAGNOSIS — K219 Gastro-esophageal reflux disease without esophagitis: Secondary | ICD-10-CM | POA: Diagnosis not present

## 2017-12-31 MED ORDER — ALBUTEROL SULFATE (2.5 MG/3ML) 0.083% IN NEBU: 2.5000 mg | INHALATION_SOLUTION | RESPIRATORY_TRACT | 12 refills | Status: DC | PRN

## 2017-12-31 MED ORDER — AZITHROMYCIN 250 MG PO TABS: ORAL_TABLET | ORAL | 0 refills | Status: DC

## 2017-12-31 NOTE — Progress Notes (Signed)
Gabrielle Terry  MRN: [id number] DOB: Aug 12, 1960  Subjective:  HPI   The patient is a 57 year old female who presents for evaluation of cough.  She states she often has productive cough of green/yellow sputum.  She states that 2 weeks ago it was productive of brown sputum which worried her. Last week she was in [place] and her cough is now productive of the yellowish sputum again.  She has head and chest congestion. Chronic issues are also reviewed.she needs lab work.  Patient Active Problem List   Diagnosis Date Noted  . AB (asthmatic bronchitis) 10/14/2015  . Abnormal bruising 10/14/2015  . Toxic effect of venom 10/14/2015  . Airway hyperreactivity 10/14/2015  . Acid reflux 10/14/2015  . Elective abortion 10/14/2015  . Hypercholesteremia 10/14/2015  . Cannot sleep 10/14/2015  . Hemorrhoids, internal 10/14/2015  . Climacteric 10/14/2015  . Localized superficial swelling, mass, or lump 10/14/2015  . Adiposity 10/14/2015  . Plantar fasciitis 10/14/2015  . BP (high blood pressure) 04/07/2015  . Allergic rhinitis 04/07/2015  . Arthritis of knee, degenerative 12/18/2014  . Diffuse cystic mastopathy 01/28/2014  . Personal history of colonic polyps 01/09/2013    Past Medical History:  Diagnosis Date  . Arthritis    knees  . Asthma 2009  . Breast hematoma 2011   Right  . Colon polyp 2013   3  . Cough    from sinus drainage  . Diffuse cystic mastopathy   . Foot cramps   . GERD (gastroesophageal reflux disease)   . Hypercholesteremia   . Hypertension   . Ulcer     Social History   Socioeconomic History  . Marital status: Single    Spouse name: Not on file  . Number of children: Not on file  . Years of education: Not on file  . Highest education level: Not on file  Occupational History  . Not on file  Social Needs  . Financial resource strain: Not on file  . Food insecurity:    Worry: Not on file    Inability: Not on file  . Transportation needs:    Medical:  Not on file    Non-medical: Not on file  Tobacco Use  . Smoking status: Never Smoker  . Smokeless tobacco: Never Used  Substance and Sexual Activity  . Alcohol use: No    Alcohol/week: 0.6 oz    Types: 1 Glasses of wine per week  . Drug use: No  . Sexual activity: Not on file  Lifestyle  . Physical activity:    Days per week: Not on file    Minutes per session: Not on file  . Stress: Not on file  Relationships  . Social connections:    Talks on phone: Not on file    Gets together: Not on file    Attends religious service: Not on file    Active member of club or organization: Not on file    Attends meetings of clubs or organizations: Not on file    Relationship status: Not on file  . Intimate partner violence:    Fear of current or ex partner: Not on file    Emotionally abused: Not on file    Physically abused: Not on file    Forced sexual activity: Not on file  Other Topics Concern  . Not on file  Social History Narrative  . Not on file    Outpatient Encounter Medications as of 12/31/2017  Medication Sig  . ADVAIR HFA  230-21 MCG/ACT inhaler INHALE 2 PUFFS INTO THE LUNGS TWICE DAILY  . amLODipine (NORVASC) 2.5 MG tablet TAKE 1 TABLET BY MOUTH EVERY DAY  . AUVI-Q 0.3 MG/0.3ML SOAJ injection Use as directed for life-threatening allergic reaction.  Marland Kitchen dexlansoprazole (DEXILANT) 60 MG capsule Take 1 capsule (60 mg total) by mouth every morning.  Marland Kitchen estradiol (ESTRACE) 1 MG tablet Take 1 mg daily by mouth.  . losartan-hydrochlorothiazide (HYZAAR) 100-25 MG tablet TAKE 1 TABLET BY MOUTH DAILY  . lovastatin (MEVACOR) 20 MG tablet TAKE 1 TABLET BY MOUTH DAILY  . polyethylene glycol (MIRALAX / GLYCOLAX) packet Take 17 g by mouth daily.  Marland Kitchen PROAIR HFA 108 (90 Base) MCG/ACT inhaler INHALE 1 TO 2 PUFFS INTO THE LUNGS EVERY 4 HOURS AS NEEDED FOR WHEEZING  . ranitidine (ZANTAC) 300 MG tablet Take 1 tablet (300 mg total) by mouth at bedtime.  . fluticasone (FLONASE) 50 MCG/ACT nasal spray  Place 2 sprays into both nostrils daily. (Patient not taking: Reported on 12/31/2017)  . [DISCONTINUED] losartan-hydrochlorothiazide (HYZAAR) 100-25 MG tablet   . [DISCONTINUED] lovastatin (MEVACOR) 20 MG tablet    Facility-Administered Encounter Medications as of 12/31/2017  Medication  . Benralizumab SOSY 30 mg    No Known Allergies  Review of Systems  Constitutional: Positive for malaise/fatigue. Negative for fever.  HENT: Positive for congestion and nosebleeds. Negative for ear discharge, ear pain, hearing loss, sinus pain, sore throat and tinnitus.   Eyes: Negative for blurred vision, double vision, photophobia, pain, discharge and redness.  Respiratory: Positive for cough and sputum production. Negative for hemoptysis, shortness of breath and wheezing.   Cardiovascular: Negative for chest pain, palpitations, orthopnea, claudication and leg swelling.  Gastrointestinal: Negative.   Endo/Heme/Allergies: Negative.   Psychiatric/Behavioral: Negative.     Objective:  BP 120/82 (BP Location: Right Arm, Patient Position: Sitting, Cuff Size: Normal)   Pulse 82   Temp 98.1 F (36.7 C) (Oral)   Resp 16   Wt 226 lb (102.5 kg)   SpO2 98%   BMI 37.04 kg/m   Physical Exam  Constitutional: She is oriented to person, place, and time and well-developed, well-nourished, and in no distress.  HENT:  Head: Normocephalic and atraumatic.  Right Ear: External ear normal.  Left Ear: External ear normal.  Nose: Nose normal.  Eyes: No scleral icterus.  Neck: No thyromegaly present.  Cardiovascular: Normal rate, regular rhythm and normal heart sounds.  Pulmonary/Chest: Effort normal.  Abdominal: Soft.  Neurological: She is alert and oriented to person, place, and time. Gait normal. GCS score is 15.  Skin: Skin is warm and dry.  Psychiatric: Mood, memory, affect and judgment normal.    Assessment and Plan :   1. Bronchitis  Breathing improved after nebulizer.  Patient instructed to use  nebulizer at home until improved.  If she does not improve she is to call back and we will send her in 6 d Prednisone taper.    - azithromycin (ZITHROMAX) 250 MG tablet; 2 PO day 1 then 1 daily  Dispense: 6 tablet; Refill: 0 - albuterol (PROVENTIL) (2.5 MG/3ML) 0.083% nebulizer solution; Take 3 mLs (2.5 mg total) by nebulization every 4 (four) hours as needed for wheezing or shortness of breath.  Dispense: 150 mL; Refill: 12 2.AR 3.HTN 4.HLD Check labs for chronic issues.  I have done the exam and reviewed the chart and it is accurate to the best of my knowledge. Development worker, community has been used and  any errors in dictation or transcription are  unintentional. Miguel Aschoff M.D. [facility]

## 2018-01-01 DIAGNOSIS — F430 Acute stress reaction: Secondary | ICD-10-CM | POA: Diagnosis not present

## 2018-01-01 LAB — CBC WITH DIFFERENTIAL/PLATELET
BASOS ABS: 0 10*3/uL (ref 0.0–0.2)
Basos: 0 %
EOS (ABSOLUTE): 0 10*3/uL (ref 0.0–0.4)
Eos: 0 %
HEMOGLOBIN: 13.1 g/dL (ref 11.1–15.9)
Hematocrit: 39.5 % (ref 34.0–46.6)
IMMATURE GRANULOCYTES: 0 %
Immature Grans (Abs): 0 10*3/uL (ref 0.0–0.1)
LYMPHS ABS: 2.4 10*3/uL (ref 0.7–3.1)
LYMPHS: 37 %
MCH: 28.2 pg (ref 26.6–33.0)
MCHC: 33.2 g/dL (ref 31.5–35.7)
MCV: 85 fL (ref 79–97)
MONOCYTES: 5 %
Monocytes Absolute: 0.3 10*3/uL (ref 0.1–0.9)
NEUTROS PCT: 58 %
Neutrophils Absolute: 3.7 10*3/uL (ref 1.4–7.0)
Platelets: 279 10*3/uL (ref 150–450)
RBC: 4.64 x10E6/uL (ref 3.77–5.28)
RDW: 14.4 % (ref 12.3–15.4)
WBC: 6.4 10*3/uL (ref 3.4–10.8)

## 2018-01-01 LAB — LIPID PANEL WITH LDL/HDL RATIO
CHOLESTEROL TOTAL: 180 mg/dL (ref 100–199)
HDL: 33 mg/dL — ABNORMAL LOW (ref >39)
LDL CALC: 130 mg/dL — AB (ref 0–99)
LDl/HDL Ratio: 3.9 ratio — ABNORMAL HIGH (ref 0.0–3.2)
TRIGLYCERIDES: 84 mg/dL (ref 0–149)
VLDL CHOLESTEROL CAL: 17 mg/dL (ref 5–40)

## 2018-01-01 LAB — COMPREHENSIVE METABOLIC PANEL
ALBUMIN: 3.8 g/dL (ref 3.5–5.5)
ALK PHOS: 66 IU/L (ref 39–117)
ALT: 11 IU/L (ref 0–32)
AST: 15 IU/L (ref 0–40)
Albumin/Globulin Ratio: 1.5 (ref 1.2–2.2)
BUN / CREAT RATIO: 15 (ref 9–23)
BUN: 13 mg/dL (ref 6–24)
Bilirubin Total: <0.2 mg/dL (ref 0.0–1.2)
CALCIUM: 9 mg/dL (ref 8.7–10.2)
CO2: 25 mmol/L (ref 20–29)
CREATININE: 0.84 mg/dL (ref 0.57–1.00)
Chloride: 104 mmol/L (ref 96–106)
GFR calc Af Amer: 90 mL/min/{1.73_m2} (ref >59)
GFR calc non Af Amer: 78 mL/min/{1.73_m2} (ref >59)
GLUCOSE: 106 mg/dL — AB (ref 65–99)
Globulin, Total: 2.5 g/dL (ref 1.5–4.5)
Potassium: 3.6 mmol/L (ref 3.5–5.2)
Sodium: 142 mmol/L (ref 134–144)
Total Protein: 6.3 g/dL (ref 6.0–8.5)

## 2018-01-01 LAB — TSH: TSH: 1.58 u[IU]/mL (ref 0.450–4.500)

## 2018-01-08 ENCOUNTER — Encounter: Payer: Self-pay | Admitting: Pediatrics

## 2018-01-08 ENCOUNTER — Ambulatory Visit: Payer: BLUE CROSS/BLUE SHIELD | Admitting: Pediatrics

## 2018-01-08 VITALS — BP 126/84 | HR 64 | Temp 98.5°F | Resp 18 | Ht 65.0 in | Wt 223.0 lb

## 2018-01-08 DIAGNOSIS — T63481D Toxic effect of venom of other arthropod, accidental (unintentional), subsequent encounter: Secondary | ICD-10-CM

## 2018-01-08 DIAGNOSIS — T63481A Toxic effect of venom of other arthropod, accidental (unintentional), initial encounter: Secondary | ICD-10-CM | POA: Insufficient documentation

## 2018-01-08 NOTE — Progress Notes (Addendum)
  [place] [id number] Dept: [phone]  FOLLOW UP NOTE  Patient ID: Gabrielle Terry, female    DOB: 21-Nov-1960  Age: 57 y.o. MRN: [id number] Date of Office Visit: 01/08/2018  Assessment  Chief Complaint: No chief complaint on file.  HPI Gabrielle Terry presents for testing to insect venoms. 40 years ago she had several insect stings while she was mowing the lawn. She developed hives, shortness of breath and went to the emergency room for treatment of this reaction. She has not had subsequent stings . Her asthma is well controlled. Last fall she had a serum IgE to insect venoms which showed slight reactivity to honeybee ,hornets , yellow jacket, wasp  and bumblebee venom  Drug Allergies:  No Known Allergies  Physical Exam: BP 126/84   Pulse 64   Temp 98.5 F (36.9 C) (Oral)   Resp 18   Ht 5\' 5"  (1.651 m)   Wt 223 lb (101.2 kg)   SpO2 95%   BMI 37.11 kg/m    Physical Exam  Constitutional: She is oriented to person, place, and time. She appears well-developed and well-nourished.  HENT:  Eyes normal. Ears normal. Nose normal. Pharynx normal.  Neck: Neck supple.  Cardiovascular:  S1 and S2 normal no murmurs  Pulmonary/Chest:  Clear to percussion and auscultation  Lymphadenopathy:    She has no cervical adenopathy.  Neurological: She is alert and oriented to person, place, and time.  Skin:  clear  Psychiatric: She has a normal mood and affect. Her behavior is normal. Judgment and thought content normal.  Vitals reviewed.   Diagnostics:  Allergy skin Tests were positive to white hornet venom 1.0 g per mL concentration. She had mild reactivity to yellow jacket and yellow hornet at 1.0 g per mL concentration  Assessment and Plan: 1. 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 You may receive the injection in the [place] office Mixed vespid  contains hornets and  yellow jacket Let us know if you want to start on the mixed vespid injections  Continue on your current medications and keep your follow-up appointments  scheduled with Dr. Neldon Mc      Return if symptoms worsen or fail to improve.    Thank you for the opportunity to care for this patient.  Please do not hesitate to contact me with questions.  Penne Lash, M.D.  [facility] [address] [place], [place] [address] [phone]

## 2018-01-08 NOTE — Patient Instructions (Addendum)
If you have an insect sting, take Benadryl 50 mg every 4 hours and if you have  life-threatening symptoms inject  with  AUVI-Q 0.3 mg  The treatment for your  insect sting allergy would be one injection of mixed vespid according to the schedule that we gave you. You may receive the injection in the [place] office Mixed vespid  contains hornets and  yellow jacket Let us know if you want to start on the mixed vespid injections  Continue on your current medications and keep your follow-up appointments  scheduled with Dr. Neldon Mc

## 2018-01-18 ENCOUNTER — Other Ambulatory Visit: Payer: Self-pay | Admitting: Family Medicine

## 2018-01-18 DIAGNOSIS — I10 Essential (primary) hypertension: Secondary | ICD-10-CM

## 2018-01-28 DIAGNOSIS — F430 Acute stress reaction: Secondary | ICD-10-CM | POA: Diagnosis not present

## 2018-02-04 DIAGNOSIS — Z1231 Encounter for screening mammogram for malignant neoplasm of breast: Secondary | ICD-10-CM | POA: Diagnosis not present

## 2018-02-04 LAB — HM MAMMOGRAPHY

## 2018-02-08 DIAGNOSIS — J4550 Severe persistent asthma, uncomplicated: Secondary | ICD-10-CM | POA: Diagnosis not present

## 2018-02-08 DIAGNOSIS — F430 Acute stress reaction: Secondary | ICD-10-CM | POA: Diagnosis not present

## 2018-02-14 ENCOUNTER — Ambulatory Visit: Payer: BLUE CROSS/BLUE SHIELD

## 2018-02-21 ENCOUNTER — Ambulatory Visit (INDEPENDENT_AMBULATORY_CARE_PROVIDER_SITE_OTHER): Payer: BLUE CROSS/BLUE SHIELD | Admitting: *Deleted

## 2018-02-21 DIAGNOSIS — J4550 Severe persistent asthma, uncomplicated: Secondary | ICD-10-CM | POA: Diagnosis not present

## 2018-02-21 DIAGNOSIS — F430 Acute stress reaction: Secondary | ICD-10-CM | POA: Diagnosis not present

## 2018-02-27 ENCOUNTER — Other Ambulatory Visit: Payer: Self-pay | Admitting: Family Medicine

## 2018-02-27 DIAGNOSIS — Z01419 Encounter for gynecological examination (general) (routine) without abnormal findings: Secondary | ICD-10-CM | POA: Diagnosis not present

## 2018-02-27 DIAGNOSIS — I10 Essential (primary) hypertension: Secondary | ICD-10-CM

## 2018-03-05 ENCOUNTER — Other Ambulatory Visit: Payer: Self-pay | Admitting: Obstetrics and Gynecology

## 2018-03-05 DIAGNOSIS — Z1231 Encounter for screening mammogram for malignant neoplasm of breast: Secondary | ICD-10-CM

## 2018-03-28 ENCOUNTER — Other Ambulatory Visit: Payer: Self-pay | Admitting: Family Medicine

## 2018-03-28 DIAGNOSIS — I10 Essential (primary) hypertension: Secondary | ICD-10-CM

## 2018-03-28 DIAGNOSIS — J4531 Mild persistent asthma with (acute) exacerbation: Secondary | ICD-10-CM

## 2018-04-16 DIAGNOSIS — J4550 Severe persistent asthma, uncomplicated: Secondary | ICD-10-CM | POA: Diagnosis not present

## 2018-04-18 ENCOUNTER — Ambulatory Visit (INDEPENDENT_AMBULATORY_CARE_PROVIDER_SITE_OTHER): Payer: BLUE CROSS/BLUE SHIELD | Admitting: *Deleted

## 2018-04-18 DIAGNOSIS — J4550 Severe persistent asthma, uncomplicated: Secondary | ICD-10-CM | POA: Diagnosis not present

## 2018-04-22 ENCOUNTER — Other Ambulatory Visit: Payer: Self-pay | Admitting: Allergy and Immunology

## 2018-04-30 ENCOUNTER — Other Ambulatory Visit: Payer: Self-pay | Admitting: Family Medicine

## 2018-04-30 ENCOUNTER — Encounter: Payer: Self-pay | Admitting: Family Medicine

## 2018-04-30 ENCOUNTER — Ambulatory Visit: Payer: BLUE CROSS/BLUE SHIELD | Admitting: Family Medicine

## 2018-04-30 VITALS — BP 122/60 | HR 64 | Temp 97.8°F | Resp 16 | Ht 65.0 in | Wt 213.0 lb

## 2018-04-30 DIAGNOSIS — R233 Spontaneous ecchymoses: Secondary | ICD-10-CM

## 2018-04-30 DIAGNOSIS — R238 Other skin changes: Secondary | ICD-10-CM | POA: Diagnosis not present

## 2018-04-30 DIAGNOSIS — I10 Essential (primary) hypertension: Secondary | ICD-10-CM

## 2018-04-30 NOTE — Progress Notes (Signed)
Patient: Gabrielle Terry Female    DOB: 1961/05/02   57 y.o.   MRN: [id number] Visit Date: 04/30/2018  Today's Provider: Wilhemena Durie, MD   Chief Complaint  Patient presents with  . easily bruising   Subjective:    HPI Patient comes in today c/o easy bruising on both arms. She is unaware of how she got the bruises. She denies any injuries. She does not take any anticoagulants including aspirin. She reports that she does get them on her legs too, but does not currently have them.     No Known Allergies   Current Outpatient Medications:  .  ADVAIR HFA 935-70 MCG/ACT inhaler, INHALE 2 PUFFS INTO THE LUNGS TWICE DAILY, Disp: 12 g, Rfl: 5 .  albuterol (PROVENTIL) (2.5 MG/3ML) 0.083% nebulizer solution, Take 3 mLs (2.5 mg total) by nebulization every 4 (four) hours as needed for wheezing or shortness of breath., Disp: 150 mL, Rfl: 12 .  amLODipine (NORVASC) 2.5 MG tablet, TAKE 1 TABLET BY MOUTH EVERY DAY, Disp: 30 tablet, Rfl: 11 .  AUVI-Q 0.3 MG/0.3ML SOAJ injection, Use as directed for life-threatening allergic reaction., Disp: 4 Device, Rfl: 3 .  DEXILANT 60 MG capsule, TAKE 1 CAPSULE(60 MG) BY MOUTH EVERY MORNING, Disp: 30 capsule, Rfl: 2 .  estradiol (ESTRACE) 1 MG tablet, Take 1 mg daily by mouth., Disp: , Rfl:  .  fluticasone (FLONASE) 50 MCG/ACT nasal spray, Place 2 sprays into both nostrils daily., Disp: 16 g, Rfl: 6 .  losartan-hydrochlorothiazide (HYZAAR) 100-25 MG tablet, TAKE 1 TABLET BY MOUTH DAILY, Disp: 30 tablet, Rfl: 0 .  lovastatin (MEVACOR) 20 MG tablet, TAKE 1 TABLET BY MOUTH DAILY, Disp: 30 tablet, Rfl: 0 .  polyethylene glycol (MIRALAX / GLYCOLAX) packet, Take 17 g by mouth daily., Disp: , Rfl:  .  PROAIR HFA 108 (90 Base) MCG/ACT inhaler, INHALE 1 TO 2 PUFFS INTO THE LUNGS EVERY 4 HOURS AS NEEDED FOR WHEEZING, Disp: 8 g, Rfl: 11 .  ranitidine (ZANTAC) 300 MG tablet, Take 1 tablet (300 mg total) by mouth at bedtime., Disp: 30 tablet, Rfl: 5  Current  Facility-Administered Medications:  .  Benralizumab SOSY 30 mg, 30 mg, Subcutaneous, Q28 days, Kennith Gain, MD, 30 mg at 04/18/18 1750  Review of Systems  Constitutional: Negative for activity change, appetite change, chills, diaphoresis, fatigue, fever and unexpected weight change.  Respiratory: Negative for cough and shortness of breath.   Cardiovascular: Negative for chest pain, palpitations and leg swelling.  Gastrointestinal: Negative.   Endocrine: Negative for cold intolerance, heat intolerance, polydipsia, polyphagia and polyuria.  Genitourinary: Negative.   Skin: Negative for color change, pallor, rash and wound.  Allergic/Immunologic: Negative.   Neurological: Negative for dizziness, syncope, weakness, numbness and headaches.  Hematological: Bruises/bleeds easily.    Social History   Tobacco Use  . Smoking status: Never Smoker  . Smokeless tobacco: Never Used  Substance Use Topics  . Alcohol use: No    Alcohol/week: 1.0 standard drinks    Types: 1 Glasses of wine per week   Objective:   BP 122/60 (BP Location: Left Arm, Patient Position: Sitting, Cuff Size: Large)   Pulse 64   Temp 97.8 F (36.6 C)   Resp 16   Ht 5\' 5"  (1.651 m)   Wt 213 lb (96.6 kg)   SpO2 98%   BMI 35.45 kg/m  Vitals:   04/30/18 1537  BP: 122/60  Pulse: 64  Resp: 16  Temp: 97.8  F (36.6 C)  SpO2: 98%  Weight: 213 lb (96.6 kg)  Height: 5\' 5"  (1.651 m)     Physical Exam  Constitutional: She is oriented to person, place, and time. She appears well-developed and well-nourished.  HENT:  Head: Normocephalic and atraumatic.  Right Ear: External ear normal.  Left Ear: External ear normal.  Nose: Nose normal.  Eyes: Conjunctivae are normal. No scleral icterus.  Neck: No thyromegaly present.  Cardiovascular: Normal rate, regular rhythm and normal heart sounds.  Pulmonary/Chest: Effort normal and breath sounds normal.  Abdominal: Soft.  Musculoskeletal: She exhibits no  edema.  Neurological: She is alert and oriented to person, place, and time.  Skin: Skin is warm and dry.  Psychiatric: She has a normal mood and affect. Her behavior is normal. Judgment and thought content normal.        Assessment & Plan:     1. Easy bruising This is just lower arms,I think completely benign.  Hematology referral offered. - CBC with Differential/Platelet - PT and PTT       I have done the exam and reviewed the above chart and it is accurate to the best of my knowledge. Development worker, community has been used in this note in any air is in the dictation or transcription are unintentional.  Wilhemena Durie, MD  [place]

## 2018-05-01 LAB — CBC WITH DIFFERENTIAL/PLATELET
Basophils Absolute: 0 10*3/uL (ref 0.0–0.2)
Basos: 0 %
EOS (ABSOLUTE): 0 10*3/uL (ref 0.0–0.4)
Eos: 0 %
HEMATOCRIT: 42.2 % (ref 34.0–46.6)
HEMOGLOBIN: 13.9 g/dL (ref 11.1–15.9)
IMMATURE GRANULOCYTES: 0 %
Immature Grans (Abs): 0 10*3/uL (ref 0.0–0.1)
LYMPHS ABS: 2.1 10*3/uL (ref 0.7–3.1)
Lymphs: 31 %
MCH: 27.4 pg (ref 26.6–33.0)
MCHC: 32.9 g/dL (ref 31.5–35.7)
MCV: 83 fL (ref 79–97)
MONOCYTES: 5 %
Monocytes Absolute: 0.4 10*3/uL (ref 0.1–0.9)
NEUTROS PCT: 64 %
Neutrophils Absolute: 4.3 10*3/uL (ref 1.4–7.0)
Platelets: 289 10*3/uL (ref 150–450)
RBC: 5.07 x10E6/uL (ref 3.77–5.28)
RDW: 14.3 % (ref 12.3–15.4)
WBC: 6.9 10*3/uL (ref 3.4–10.8)

## 2018-05-01 LAB — PT AND PTT
APTT: 28 s (ref 24–33)
INR: 1 (ref 0.8–1.2)
PROTHROMBIN TIME: 11 s (ref 9.1–12.0)

## 2018-05-29 ENCOUNTER — Telehealth: Payer: Self-pay

## 2018-05-29 ENCOUNTER — Other Ambulatory Visit: Payer: Self-pay | Admitting: Allergy and Immunology

## 2018-05-29 NOTE — Telephone Encounter (Signed)
error 

## 2018-06-10 DIAGNOSIS — J4550 Severe persistent asthma, uncomplicated: Secondary | ICD-10-CM | POA: Diagnosis not present

## 2018-06-11 ENCOUNTER — Ambulatory Visit (INDEPENDENT_AMBULATORY_CARE_PROVIDER_SITE_OTHER): Payer: BLUE CROSS/BLUE SHIELD | Admitting: *Deleted

## 2018-06-11 DIAGNOSIS — J4550 Severe persistent asthma, uncomplicated: Secondary | ICD-10-CM

## 2018-06-11 DIAGNOSIS — J069 Acute upper respiratory infection, unspecified: Secondary | ICD-10-CM | POA: Diagnosis not present

## 2018-06-13 ENCOUNTER — Ambulatory Visit: Payer: BLUE CROSS/BLUE SHIELD

## 2018-07-06 ENCOUNTER — Other Ambulatory Visit: Payer: Self-pay | Admitting: Allergy and Immunology

## 2018-07-30 ENCOUNTER — Telehealth: Payer: Self-pay

## 2018-07-30 NOTE — Telephone Encounter (Signed)
Gyanu from [facility] is calling to let PCP know that patient has enrolled in nursing support program through [facility]. Representative states that if there is anything we request that the patient work on with there nursing staff to please contact them back. Phone number [phone]. KW

## 2018-07-30 NOTE — Telephone Encounter (Signed)
FYI

## 2018-08-05 DIAGNOSIS — J4550 Severe persistent asthma, uncomplicated: Secondary | ICD-10-CM | POA: Diagnosis not present

## 2018-08-06 ENCOUNTER — Ambulatory Visit (INDEPENDENT_AMBULATORY_CARE_PROVIDER_SITE_OTHER): Payer: BLUE CROSS/BLUE SHIELD

## 2018-08-06 DIAGNOSIS — J4550 Severe persistent asthma, uncomplicated: Secondary | ICD-10-CM

## 2018-08-09 ENCOUNTER — Other Ambulatory Visit: Payer: Self-pay | Admitting: Allergy & Immunology

## 2018-09-02 ENCOUNTER — Ambulatory Visit: Payer: BLUE CROSS/BLUE SHIELD | Admitting: Family Medicine

## 2018-09-02 ENCOUNTER — Encounter: Payer: Self-pay | Admitting: Family Medicine

## 2018-09-02 VITALS — BP 100/60 | HR 72 | Temp 98.2°F | Resp 16 | Wt 199.0 lb

## 2018-09-02 DIAGNOSIS — J0100 Acute maxillary sinusitis, unspecified: Secondary | ICD-10-CM

## 2018-09-02 MED ORDER — AMOXICILLIN 875 MG PO TABS: 875.0000 mg | ORAL_TABLET | Freq: Two times a day (BID) | ORAL | 0 refills | Status: DC

## 2018-09-02 NOTE — Progress Notes (Signed)
Patient: Gabrielle Terry Female    DOB: [birth year]   58 y.o.   MRN: [id number] Visit Date: 09/02/2018  Today's Provider: Vernie Murders, PA   Chief Complaint  Patient presents with  . Sinusitis   Subjective:   Sinusitis  The current episode started 1 to 4 weeks ago (2 weeks). The problem is unchanged. There has been no fever. Associated symptoms include congestion, coughing and sinus pressure. Pertinent negatives include no chills, diaphoresis, ear pain, headaches, hoarse voice, neck pain, shortness of breath, sneezing, sore throat or swollen glands. Treatments tried: Mucinex.   Patient has had sinus congestion and cough for 2  Weeks. Patient states cough is productive. Patient also has symptoms of sinus pressure, chest congestion, and runny nose. Patient has been taking Mucinex with no relief.   Past Medical History:  Diagnosis Date  . Arthritis    knees  . Asthma 2009  . Breast hematoma 2011   Right  . Colon polyp 2013   3  . Cough    from sinus drainage  . Diffuse cystic mastopathy   . Foot cramps   . GERD (gastroesophageal reflux disease)   . Hypercholesteremia   . Hypertension   . Ulcer    Past Surgical History:  Procedure Laterality Date  . ABDOMINAL HYSTERECTOMY  1997  . BREAST SURGERY Right 2011   hematoma  . [place]  . COLONOSCOPY  2013   3 polyps/ Dr Jamal Collin  . COLONOSCOPY WITH PROPOFOL N/A 03/21/2017   Procedure: COLONOSCOPY WITH PROPOFOL;  Surgeon: Christene Lye, MD;  Location: [facility] ENDOSCOPY;  Service: Endoscopy;  Laterality: N/A;  . ESOPHAGOGASTRODUODENOSCOPY (EGD) WITH PROPOFOL N/A 01/26/2016   Procedure: ESOPHAGOGASTRODUODENOSCOPY (EGD) WITH PROPOFOL;  Surgeon: Manya Silvas, MD;  [facility];  Service: Endoscopy;  Laterality: N/A;  . ETHMOIDECTOMY Bilateral 04/29/2015   Procedure: TOTAL ETHMOIDECTOMY;  Surgeon: Margaretha Sheffield, MD;  Location: [place];  Service: ENT;  Laterality: Bilateral;  .  FRONTAL SINUS EXPLORATION Bilateral 04/29/2015   Procedure: FRONTAL SINUS EXPLORATION;  Surgeon: Margaretha Sheffield, MD;  Location: [place];  Service: ENT;  Laterality: Bilateral;  . IMAGE GUIDED SINUS SURGERY N/A 04/29/2015   Procedure: IMAGE GUIDED SINUS SURGERY;  Surgeon: Margaretha Sheffield, MD;  Location: [place];  Service: ENT;  Laterality: N/A;  GAVE DISK TO CE CE  . MAXILLARY ANTROSTOMY Bilateral 04/29/2015   Procedure: MAXILLARY ANTROSTOMY WITH REMOVAL OF CONTENTS;  Surgeon: Margaretha Sheffield, MD;  Location: [place];  Service: ENT;  Laterality: Bilateral;  . MYOMECTOMY  1991  . NASAL SEPTUM SURGERY  2013  . salpingo oophorectmy  1997  . SPHENOIDECTOMY Bilateral 04/29/2015   Procedure: SPHENOIDECTOMY;  Surgeon: Margaretha Sheffield, MD;  Location: [place];  Service: ENT;  Laterality: Bilateral;  . TUBAL LIGATION    . UTERINE FIBROID SURGERY     removed   Family History  Adopted: Yes  Problem Relation Age of Onset  . Congestive Heart Failure Father   . Cancer Brother   . Other Brother        brain tumor  . Hypertension Maternal Aunt   . Thyroid disease Maternal Aunt   . Diabetes Maternal Grandmother   . Asthma Son    No Known Allergies  Current Outpatient Medications:  .  ADVAIR HFA 659-93 MCG/ACT inhaler, INHALE 2 PUFFS INTO THE LUNGS TWICE DAILY, Disp: 12 g, Rfl: 5 .  albuterol (PROVENTIL) (2.5 MG/3ML) 0.083% nebulizer  solution, Take 3 mLs (2.5 mg total) by nebulization every 4 (four) hours as needed for wheezing or shortness of breath., Disp: 150 mL, Rfl: 12 .  amLODipine (NORVASC) 2.5 MG tablet, TAKE 1 TABLET BY MOUTH EVERY DAY, Disp: 30 tablet, Rfl: 11 .  amoxicillin (AMOXIL) 875 MG tablet, Take 1 tablet (875 mg total) by mouth 2 (two) times daily., Disp: 20 tablet, Rfl: 0 .  AUVI-Q 0.3 MG/0.3ML SOAJ injection, Use as directed for life-threatening allergic reaction., Disp: 4 Device, Rfl: 3 .  DEXILANT 60 MG capsule, TAKE 1 CAPSULE(60 MG) BY MOUTH EVERY  MORNING, Disp: 30 capsule, Rfl: 2 .  estradiol (ESTRACE) 1 MG tablet, Take 1 mg daily by mouth., Disp: , Rfl:  .  fluticasone (FLONASE) 50 MCG/ACT nasal spray, Place 2 sprays into both nostrils daily., Disp: 16 g, Rfl: 6 .  losartan-hydrochlorothiazide (HYZAAR) 100-25 MG tablet, TAKE 1 TABLET BY MOUTH DAILY, Disp: 30 tablet, Rfl: 11 .  lovastatin (MEVACOR) 20 MG tablet, TAKE 1 TABLET BY MOUTH DAILY, Disp: 30 tablet, Rfl: 0 .  polyethylene glycol (MIRALAX / GLYCOLAX) packet, Take 17 g by mouth daily., Disp: , Rfl:  .  PROAIR HFA 108 (90 Base) MCG/ACT inhaler, INHALE 1 TO 2 PUFFS INTO THE LUNGS EVERY 4 HOURS AS NEEDED FOR WHEEZING, Disp: 8 g, Rfl: 11 .  ranitidine (ZANTAC) 300 MG tablet, TAKE 1 TABLET(300 MG) BY MOUTH AT BEDTIME, Disp: 30 tablet, Rfl: 0  Current Facility-Administered Medications:  .  Benralizumab SOSY 30 mg, 30 mg, Subcutaneous, Q28 days, Kennith Gain, MD, 30 mg at 08/06/18 1734  Review of Systems  Constitutional: Negative for appetite change, chills, diaphoresis, fatigue and fever.  HENT: Positive for congestion, rhinorrhea and sinus pressure. Negative for ear pain, hoarse voice, sinus pain, sneezing and sore throat.   Respiratory: Positive for cough. Negative for chest tightness, shortness of breath and wheezing.   Cardiovascular: Negative for chest pain and palpitations.  Gastrointestinal: Negative for abdominal pain, nausea and vomiting.  Musculoskeletal: Negative for neck pain.  Neurological: Negative for dizziness, weakness and headaches.   Social History   Tobacco Use  . Smoking status: Never Smoker  . Smokeless tobacco: Never Used  Substance Use Topics  . Alcohol use: No    Alcohol/week: 1.0 standard drinks    Types: 1 Glasses of wine per week     Objective:   BP 100/60 (BP Location: Right Arm, Patient Position: Sitting, Cuff Size: Large)   Pulse 72   Temp 98.2 F (36.8 C) (Oral)   Resp 16   Wt 199 lb (90.3 kg)   SpO2 97%   BMI 33.12 kg/m    Vitals:   09/02/18 1622  BP: 100/60  Pulse: 72  Resp: 16  Temp: 98.2 F (36.8 C)  TempSrc: Oral  SpO2: 97%  Weight: 199 lb (90.3 kg)    Physical Exam Constitutional:      General: She is not in acute distress.    Appearance: She is well-developed.  HENT:     Head: Normocephalic and atraumatic.     Right Ear: Hearing and tympanic membrane normal.     Left Ear: Hearing and tympanic membrane normal.     Nose: Nose normal.     Mouth/Throat:     Pharynx: Oropharynx is clear.  Eyes:     General: Lids are normal. No scleral icterus.       Right eye: No discharge.        Left eye: No discharge.  Conjunctiva/sclera: Conjunctivae normal.  Neck:     Musculoskeletal: Neck supple.  Cardiovascular:     Rate and Rhythm: Normal rate and regular rhythm.  Pulmonary:     Effort: Pulmonary effort is normal. No respiratory distress.  Abdominal:     General: Bowel sounds are normal.     Palpations: Abdomen is soft.  Musculoskeletal: Normal range of motion.  Lymphadenopathy:     Cervical: No cervical adenopathy.  Skin:    Findings: No lesion or rash.  Neurological:     Mental Status: She is alert and oriented to person, place, and time.  Psychiatric:        Speech: Speech normal.        Behavior: Behavior normal.        Thought Content: Thought content normal.       Assessment & Plan    1. Acute non-recurrent maxillary sinusitis Onset with some sore throat, PND, headache and cough over the past 2 weeks. No further sore throat and no fever. Yellowish/purulent sputum with cough now. May use Mucinex-DM and add Amoxil. Increase fluid intake and recheck in 5-7 days if no better. - amoxicillin (AMOXIL) 875 MG tablet; Take 1 tablet (875 mg total) by mouth 2 (two) times daily.  Dispense: 20 tablet; Refill: [place], PA  [facility]

## 2018-09-30 DIAGNOSIS — J4550 Severe persistent asthma, uncomplicated: Secondary | ICD-10-CM

## 2018-10-01 ENCOUNTER — Ambulatory Visit (INDEPENDENT_AMBULATORY_CARE_PROVIDER_SITE_OTHER): Payer: BLUE CROSS/BLUE SHIELD | Admitting: *Deleted

## 2018-10-01 DIAGNOSIS — J4550 Severe persistent asthma, uncomplicated: Secondary | ICD-10-CM

## 2018-10-15 ENCOUNTER — Ambulatory Visit (INDEPENDENT_AMBULATORY_CARE_PROVIDER_SITE_OTHER): Payer: BLUE CROSS/BLUE SHIELD | Admitting: Allergy and Immunology

## 2018-10-15 ENCOUNTER — Other Ambulatory Visit: Payer: Self-pay

## 2018-10-15 ENCOUNTER — Telehealth: Payer: Self-pay | Admitting: Allergy and Immunology

## 2018-10-15 ENCOUNTER — Encounter: Payer: Self-pay | Admitting: Allergy and Immunology

## 2018-10-15 VITALS — HR 66 | Temp 97.6°F | Resp 18

## 2018-10-15 DIAGNOSIS — J3089 Other allergic rhinitis: Secondary | ICD-10-CM

## 2018-10-15 DIAGNOSIS — T782XXD Anaphylactic shock, unspecified, subsequent encounter: Secondary | ICD-10-CM

## 2018-10-15 DIAGNOSIS — D721 Eosinophilia, unspecified: Secondary | ICD-10-CM

## 2018-10-15 DIAGNOSIS — J324 Chronic pansinusitis: Secondary | ICD-10-CM | POA: Diagnosis not present

## 2018-10-15 DIAGNOSIS — J4550 Severe persistent asthma, uncomplicated: Secondary | ICD-10-CM

## 2018-10-15 DIAGNOSIS — T63481D Toxic effect of venom of other arthropod, accidental (unintentional), subsequent encounter: Secondary | ICD-10-CM

## 2018-10-15 NOTE — Telephone Encounter (Signed)
Please advise 

## 2018-10-15 NOTE — Telephone Encounter (Signed)
TELEMEDICINE Please call patient about shortness of breath Patient states she is very short of breath has trouble breathing during regular activities and inhaler is not working Patient has noticed an increase in cough too Patient denies fever, could, flu symptoms  Patient feels it is due to allergy season Please call and advise patient on what she can do at this time

## 2018-10-15 NOTE — Progress Notes (Signed)
- [place] [facility] [place]   Follow-up Note   Referring Provider: Jerrol Banana.,* Primary Provider: Jerrol Banana., MD Date of Office Visit: 10/15/2018  Subjective:   Gabrielle Terry (DOB: 01-04-1961) is a 58 y.o. 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  I have not seen her in this clinic since 29 May 2017.  Since starting benralizumab injections she has had excellent control of her asthma and has not required a systemic steroid or an antibiotic in over a year and rarely uses a short acting bronchodilator while she also continues to use Advair.  Likewise, her nose has really been doing well and she has not had any significant upper airway symptoms and she does not have any anosmia or other phantosmia.  However, for the past 24 hours she has had a slight cough and yesterday she had an episode of shortness of breath that lasted about an hour or so.  She does not have any fever or upper airway congestion or ugly nasal discharge but may be a little bit of a slight headache and she does not have any chest pain or sputum production or other respiratory tract symptoms.  She did have an issue with reflux in the past but that has completely resolved and she does not need to use any medications to treat this issue at all.  She has elected to not undergo a course of immunotherapy directed against hymenoptera venom.  Allergies as of 10/15/2018   No Known Allergies     Medication List      Advair HFA 230-21 MCG/ACT inhaler Generic drug:  fluticasone-salmeterol INHALE 2 PUFFS INTO THE LUNGS TWICE DAILY   amLODipine 2.5 MG tablet Commonly known as:  NORVASC TAKE 1 TABLET BY MOUTH  EVERY DAY   Auvi-Q 0.3 mg/0.3 mL Soaj injection Generic drug:  EPINEPHrine Use as directed for life-threatening allergic reaction.   Dexilant 60 MG capsule Generic drug:  dexlansoprazole TAKE 1 CAPSULE(60 MG) BY MOUTH EVERY MORNING   estradiol 1 MG tablet Commonly known as:  ESTRACE Take 1 mg daily by mouth.   fluticasone 50 MCG/ACT nasal spray Commonly known as:  FLONASE Place 2 sprays into both nostrils daily.   losartan-hydrochlorothiazide 100-25 MG tablet Commonly known as:  HYZAAR TAKE 1 TABLET BY MOUTH DAILY   lovastatin 20 MG tablet Commonly known as:  MEVACOR TAKE 1 TABLET BY MOUTH DAILY   polyethylene glycol packet Commonly known as:  MIRALAX / GLYCOLAX Take 17 g by mouth daily.   ProAir HFA 108 (90 Base) MCG/ACT inhaler Generic drug:  albuterol INHALE 1 TO 2 PUFFS INTO THE LUNGS EVERY 4 HOURS AS NEEDED FOR WHEEZING   albuterol (2.5 MG/3ML) 0.083% nebulizer solution Commonly known as:  PROVENTIL Take 3 mLs (2.5 mg total) by nebulization every 4 (four) hours as needed for wheezing or shortness of breath.       Past Medical History:  Diagnosis Date  . Arthritis    knees  . Asthma 2009  . Breast hematoma 2011   Right  . Colon polyp 2013   3  . Cough    from sinus drainage  . Diffuse cystic mastopathy   . Foot cramps   . GERD (gastroesophageal reflux disease)   .  Hypercholesteremia   . Hypertension   . Ulcer     Past Surgical History:  Procedure Laterality Date  . ABDOMINAL HYSTERECTOMY  1997  . BREAST SURGERY Right 2011   hematoma  . [place]  . COLONOSCOPY  2013   3 polyps/ Dr Jamal Collin  . COLONOSCOPY WITH PROPOFOL N/A 03/21/2017   Procedure: COLONOSCOPY WITH PROPOFOL;  Surgeon: Christene Lye, MD;  Location: [facility] ENDOSCOPY;  Service: Endoscopy;  Laterality: N/A;  . ESOPHAGOGASTRODUODENOSCOPY (EGD) WITH PROPOFOL N/A 01/26/2016   Procedure: ESOPHAGOGASTRODUODENOSCOPY (EGD) WITH PROPOFOL;  Surgeon: Manya Silvas, MD;   Location: [facility] ENDOSCOPY;  Service: Endoscopy;  Laterality: N/A;  . ETHMOIDECTOMY Bilateral 04/29/2015   Procedure: TOTAL ETHMOIDECTOMY;  Surgeon: Margaretha Sheffield, MD;  Location: [place];  Service: ENT;  Laterality: Bilateral;  . FRONTAL SINUS EXPLORATION Bilateral 04/29/2015   Procedure: FRONTAL SINUS EXPLORATION;  Surgeon: Margaretha Sheffield, MD;  Location: [place];  Service: ENT;  Laterality: Bilateral;  . IMAGE GUIDED SINUS SURGERY N/A 04/29/2015   Procedure: IMAGE GUIDED SINUS SURGERY;  Surgeon: Margaretha Sheffield, MD;  Location: [place];  Service: ENT;  Laterality: N/A;  GAVE DISK TO CE CE  . MAXILLARY ANTROSTOMY Bilateral 04/29/2015   Procedure: MAXILLARY ANTROSTOMY WITH REMOVAL OF CONTENTS;  Surgeon: Margaretha Sheffield, MD;  Location: [place];  Service: ENT;  Laterality: Bilateral;  . MYOMECTOMY  1991  . NASAL SEPTUM SURGERY  2013  . salpingo oophorectmy  1997  . SPHENOIDECTOMY Bilateral 04/29/2015   Procedure: SPHENOIDECTOMY;  Surgeon: Margaretha Sheffield, MD;  Location: [place];  Service: ENT;  Laterality: Bilateral;  . TUBAL LIGATION    . UTERINE FIBROID SURGERY     removed    Review of systems negative except as noted in HPI / PMHx or noted below:  Review of Systems  Constitutional: Negative.   HENT: Negative.   Eyes: Negative.   Respiratory: Negative.   Cardiovascular: Negative.   Gastrointestinal: Negative.   Genitourinary: Negative.   Musculoskeletal: Negative.   Skin: Negative.   Neurological: Negative.   Endo/Heme/Allergies: Negative.   Psychiatric/Behavioral: Negative.      Objective:   Vitals:   10/15/18 1734  Pulse: 66  Resp: 18  Temp: 97.6 F (36.4 C)  SpO2: 95%          Physical Exam Constitutional:      Appearance: She is not diaphoretic.     Comments: Slight cough  HENT:     Head: Normocephalic.     Right Ear: Tympanic membrane, ear canal and external ear normal.     Left Ear: Tympanic membrane, ear canal and  external ear normal.     Nose: Nose normal. No mucosal edema or rhinorrhea.     Mouth/Throat:     Pharynx: Uvula midline. No oropharyngeal exudate.  Eyes:     Conjunctiva/sclera: Conjunctivae normal.  Neck:     Thyroid: No thyromegaly.     Trachea: Trachea normal. No tracheal tenderness or tracheal deviation.  Cardiovascular:     Rate and Rhythm: Normal rate and regular rhythm.     Heart sounds: Normal heart sounds, S1 normal and S2 normal. No murmur.  Pulmonary:     Effort: No respiratory distress.     Breath sounds: Normal breath sounds. No stridor. No wheezing or rales.  Lymphadenopathy:     Head:     Right side of head: No tonsillar adenopathy.     Left side of head: No tonsillar adenopathy.  Cervical: No cervical adenopathy.  Skin:    Findings: No erythema or rash.     Nails: There is no clubbing.   Neurological:     Mental Status: She is alert.     Diagnostics: none  Assessment and Plan:   1. Not well controlled severe persistent asthma   2. Other allergic rhinitis   3. Chronic pansinusitis   4. Eosinophilia   5. Anaphylaxis due to hymenoptera venom, accidental or unintentional, subsequent encounter     1. Allergen avoidance measures  2. Continue to Treat and prevent inflammation:   A. Advair 230 - 2 inhalations twice a day with spacer  B. Benralizumab injections every 8 weeks   3. If needed:   A. ProAir HFA or similar 2 inhalations every 4-6 hours  B. nasal saline  C. OTC antihistamine  D. Auvi-Q 0.3  4.  For this recent episode utilize the following:   A.  Mucinex DM 1-2 tablets 1-2 times a day  B.  Claritin/Zyrtec 1 tablet 1-2 times a day  C.  Nasal saline a few times a day  D.  Prednisone 10 mg 1 time per day for 3 days only  5.  Further evaluation and treatment?  6.  If doing well then can attempt to lower Advair to 1 time per day.  7.  Return to clinic in 6 months or earlier if problem.   Kathelene appears to be doing very well regarding  her eosinophilic driven inflammatory state of her airway but it does appear as though she has had a little flareup over the course of the past 24 hours and whether or not this is secondary to allergen exposure or a virus is not entirely clear.  For now we will have her utilize the therapy noted above which is very conservative approach at this point.  If she resolves this issue and she does well then she can actually attempt to lower her medication utilization by decreasing her Advair to 1 time per day given the excellent response she is developed while using benralizumab.  I will see her back in this clinic in 6 months or earlier if there is a problem.  Allena Katz, MD [facility] [place]

## 2018-10-15 NOTE — Telephone Encounter (Signed)
Pt scheduled for 5:45 appt today.

## 2018-10-15 NOTE — Telephone Encounter (Signed)
Have her come in today. I have not seen her since 2018.

## 2018-10-15 NOTE — Patient Instructions (Addendum)
  1. Allergen avoidance measures  2. Continue to Treat and prevent inflammation:   A. Advair 230 - 2 inhalations twice a day with spacer  B. Benralizumab injections every 8 weeks   3. If needed:   A. ProAir HFA or similar 2 inhalations every 4-6 hours  B. nasal saline  C. OTC antihistamine  D. Auvi-Q 0.3  4.  For this recent episode utilize the following:   A.  Mucinex DM 1-2 tablets 1-2 times a day  B.  Claritin/Zyrtec 1 tablet 1-2 times a day  C.  Nasal saline a few times a day  D.  Prednisone 10 mg 1 time per day for 3 days only  5.  Further evaluation and treatment?  6.  If doing well then can attempt to lower Advair to 1 time per day.  7.  Return to clinic in 6 months or earlier if problem.

## 2018-10-16 ENCOUNTER — Encounter: Payer: Self-pay | Admitting: Allergy and Immunology

## 2018-10-30 ENCOUNTER — Other Ambulatory Visit: Payer: Self-pay | Admitting: Obstetrics and Gynecology

## 2018-10-31 DIAGNOSIS — Z6832 Body mass index (BMI) 32.0-32.9, adult: Secondary | ICD-10-CM | POA: Diagnosis not present

## 2018-10-31 DIAGNOSIS — Z01419 Encounter for gynecological examination (general) (routine) without abnormal findings: Secondary | ICD-10-CM | POA: Diagnosis not present

## 2018-10-31 DIAGNOSIS — B373 Candidiasis of vulva and vagina: Secondary | ICD-10-CM | POA: Diagnosis not present

## 2018-11-25 DIAGNOSIS — J4550 Severe persistent asthma, uncomplicated: Secondary | ICD-10-CM | POA: Diagnosis not present

## 2018-11-26 ENCOUNTER — Other Ambulatory Visit: Payer: Self-pay

## 2018-11-26 ENCOUNTER — Ambulatory Visit (INDEPENDENT_AMBULATORY_CARE_PROVIDER_SITE_OTHER): Payer: BLUE CROSS/BLUE SHIELD | Admitting: *Deleted

## 2018-11-26 DIAGNOSIS — J4550 Severe persistent asthma, uncomplicated: Secondary | ICD-10-CM

## 2018-12-10 ENCOUNTER — Other Ambulatory Visit: Payer: Self-pay | Admitting: Family Medicine

## 2018-12-10 DIAGNOSIS — J4531 Mild persistent asthma with (acute) exacerbation: Secondary | ICD-10-CM

## 2018-12-10 NOTE — Telephone Encounter (Deleted)
e

## 2018-12-10 NOTE — Telephone Encounter (Addendum)
[facility] faxed refill request for the following medications:  ADVAIR HFA 230-21 MCG/ACT inhaler  lovastatin (MEVACOR) 20 MG tablet   Please advise.

## 2018-12-11 MED ORDER — FLUTICASONE-SALMETEROL 230-21 MCG/ACT IN AERO: 2.0000 | INHALATION_SPRAY | Freq: Two times a day (BID) | RESPIRATORY_TRACT | 5 refills | Status: DC

## 2018-12-11 MED ORDER — LOVASTATIN 20 MG PO TABS: 20.0000 mg | ORAL_TABLET | Freq: Every day | ORAL | 1 refills | Status: DC

## 2019-01-20 DIAGNOSIS — J4550 Severe persistent asthma, uncomplicated: Secondary | ICD-10-CM | POA: Diagnosis not present

## 2019-01-21 ENCOUNTER — Ambulatory Visit (INDEPENDENT_AMBULATORY_CARE_PROVIDER_SITE_OTHER): Payer: BC Managed Care – PPO | Admitting: *Deleted

## 2019-01-21 DIAGNOSIS — J4550 Severe persistent asthma, uncomplicated: Secondary | ICD-10-CM | POA: Diagnosis not present

## 2019-02-03 ENCOUNTER — Other Ambulatory Visit: Payer: Self-pay | Admitting: Family Medicine

## 2019-02-03 DIAGNOSIS — I10 Essential (primary) hypertension: Secondary | ICD-10-CM

## 2019-02-03 NOTE — Telephone Encounter (Signed)
Please review. Thanks!  

## 2019-02-03 NOTE — Telephone Encounter (Signed)
[facility] faxed refill request for the following medications:   amLODipine (NORVASC) 2.5 MG tablet   Please advise.  

## 2019-02-05 MED ORDER — AMLODIPINE BESYLATE 2.5 MG PO TABS: 2.5000 mg | ORAL_TABLET | Freq: Every day | ORAL | 3 refills | Status: DC

## 2019-02-14 DIAGNOSIS — Z1231 Encounter for screening mammogram for malignant neoplasm of breast: Secondary | ICD-10-CM | POA: Diagnosis not present

## 2019-02-17 ENCOUNTER — Other Ambulatory Visit: Payer: Self-pay | Admitting: *Deleted

## 2019-02-17 DIAGNOSIS — Z20822 Contact with and (suspected) exposure to covid-19: Secondary | ICD-10-CM

## 2019-03-13 ENCOUNTER — Telehealth: Payer: Self-pay

## 2019-03-13 NOTE — Telephone Encounter (Signed)
Please have her seen in the [place] office today.

## 2019-03-13 NOTE — Telephone Encounter (Signed)
Dr Kozlow please advise 

## 2019-03-13 NOTE — Telephone Encounter (Signed)
Patient has been wheezing for about 2 weeks now. She has tried her Advair and albuterol. Patient is on fasenra.  Please Advise  [id number].

## 2019-03-13 NOTE — Telephone Encounter (Signed)
Patient has been placed on the schedule for tomorrow with Dr. Nelva Bush at 2:50pm.

## 2019-03-14 ENCOUNTER — Encounter: Payer: Self-pay | Admitting: Allergy

## 2019-03-14 ENCOUNTER — Other Ambulatory Visit: Payer: Self-pay

## 2019-03-14 ENCOUNTER — Ambulatory Visit: Payer: BC Managed Care – PPO | Admitting: Allergy

## 2019-03-14 VITALS — BP 138/80 | HR 73 | Temp 97.2°F | Resp 16 | Ht 65.0 in | Wt 203.4 lb

## 2019-03-14 DIAGNOSIS — T63481D Toxic effect of venom of other arthropod, accidental (unintentional), subsequent encounter: Secondary | ICD-10-CM

## 2019-03-14 DIAGNOSIS — J3089 Other allergic rhinitis: Secondary | ICD-10-CM | POA: Diagnosis not present

## 2019-03-14 DIAGNOSIS — T782XXD Anaphylactic shock, unspecified, subsequent encounter: Secondary | ICD-10-CM

## 2019-03-14 DIAGNOSIS — D721 Eosinophilia, unspecified: Secondary | ICD-10-CM

## 2019-03-14 DIAGNOSIS — J4551 Severe persistent asthma with (acute) exacerbation: Secondary | ICD-10-CM | POA: Diagnosis not present

## 2019-03-14 DIAGNOSIS — J324 Chronic pansinusitis: Secondary | ICD-10-CM | POA: Diagnosis not present

## 2019-03-14 NOTE — Progress Notes (Signed)
Follow-up Note  RE: Gabrielle Terry MRN: [id number] DOB: 11-04-1960 Date of Office Visit: 03/14/2019   History of present illness: Gabrielle Terry is a 58 y.o. female presenting today for evaluation of acute exacerbation of her asthma. 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 She was last seen on 10/15/18 by Dr.Kozlow. She was seen at this time for acute flair of asthma, however before this she had history of excellent control. Today she is seen for sob and wheezing she notices with fast pace walking and at night . She reports it started this week and last week she spent a lot of time in her yard gardening. She reports having many outdoor allergies.  She states she had not been taking her Advair as she thought she heard Dr. Neldon Mc say that she could stop taking Advair since she is on benralizumab.     Review of systems: Review of Systems  Constitutional: Negative for chills, fever and malaise/fatigue.  HENT: Negative for congestion, ear discharge, nosebleeds and sore throat.   Eyes: Negative for pain, discharge and redness.  Respiratory: Positive for cough, shortness of breath and wheezing.   Cardiovascular: Negative for chest pain.  Gastrointestinal: Negative for abdominal pain, diarrhea, heartburn, nausea and vomiting.  Musculoskeletal: Negative for joint pain.  Skin: Negative for itching and rash.  Neurological: Negative for headaches.    All other systems negative unless noted above in HPI  Past medical/social/surgical/family history have been reviewed and are unchanged unless specifically indicated below.  No changes  Medication List: Allergies as of 03/14/2019   No Known Allergies     Medication List       Accurate as of March 14, 2019  4:41 PM. If you have any questions, ask your nurse or doctor.        amLODipine 2.5 MG tablet Commonly known  as: NORVASC Take 1 tablet (2.5 mg total) by mouth daily.   Auvi-Q 0.3 mg/0.3 mL Soaj injection Generic drug: EPINEPHrine Use as directed for life-threatening allergic reaction.   Dexilant 60 MG capsule Generic drug: dexlansoprazole TAKE 1 CAPSULE(60 MG) BY MOUTH EVERY MORNING   estradiol 1 MG tablet Commonly known as: ESTRACE Take 1 mg daily by mouth.   fluticasone 50 MCG/ACT nasal spray Commonly known as: FLONASE Place 2 sprays into both nostrils daily.   fluticasone-salmeterol 230-21 MCG/ACT inhaler Commonly known as: Advair HFA Inhale 2 puffs into the lungs 2 (two) times daily.   losartan-hydrochlorothiazide 100-25 MG tablet Commonly known as: HYZAAR TAKE 1 TABLET BY MOUTH DAILY   lovastatin 20 MG tablet Commonly known as: MEVACOR Take 1 tablet (20 mg total) by mouth daily.   polyethylene glycol 17 g packet Commonly known as: MIRALAX / GLYCOLAX Take 17 g by mouth daily.   ProAir HFA 108 (90 Base) MCG/ACT inhaler Generic drug: albuterol INHALE 1 TO 2 PUFFS INTO THE LUNGS EVERY 4 HOURS AS NEEDED FOR WHEEZING   albuterol (2.5 MG/3ML) 0.083% nebulizer solution Commonly known as: PROVENTIL Take 3 mLs (2.5 mg total) by nebulization every 4 (four) hours as needed for wheezing or shortness of breath.       Known medication allergies: No Known Allergies   Physical examination: Blood pressure 138/80, pulse 73, temperature (!) 97.2 F (36.2 C), temperature source Temporal, resp. rate 16, height 5\' 5"  (1.651 m), weight 203 lb 6.4 oz (92.3 kg), SpO2 99 %.  General: Alert, interactive, in  no acute distress. HEENT: TMs pearly gray, turbinates non-edematous without discharge, post-pharynx unremarkable. Neck: Supple without lymphadenopathy. Lungs: Clear to auscultation without wheezing, rhonchi or rales. {no increased work of breathing. CV: Normal S1, S2 without murmurs. Abdomen: Nondistended, nontender. Skin: Warm and dry, without lesions or rashes. Extremities:  No  clubbing, cyanosis or edema. Neuro:   Grossly intact.  Diagnositics/Labs:  Spirometry: FEV1: 2.05L 91%, FVC: 2.5L 88%, ratio consistent with nonobstructive pattern   Assessment and plan:   Severe Persistent asthma - mild exacerbation after recent yardwork.  Will treat with prednisone pack and have advised her Advair should be continued at this time even while on biologic. 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 Allergen avoidance measures  2. Continue to Treat and prevent inflammation:   A. Advair 230 - 2 inhalations twice a day with spacer  (once symptoms improve you can decrease to 1 puffs twice a day as maintenance)  B. Benralizumab injections every 8 weeks   3. If needed:   A. ProAir HFA or similar 2 inhalations every 4-6 hours  B. nasal saline  C. OTC antihistamine  D. Auvi-Q 0.3  4.  For this recent episode utilize the following:   A.  Claritin/Zyrtec 1 tablet 1-2 times a day  B.  Nasal saline a few times a day  C.  Prednisone 20 mg 1 time per day for 4 days, then 10mg  1 time per day for 1 day   5.  Return to clinic in 4 months or earlier if problem.   Tamsen Snider, MD PGY1  [phone]  Attestation: I performed a history and physical examination of the patient and discussed management with the resident. I reviewed the resident's note and agree with the documented findings and plan of care. The note in its entirety was edited by myself, including the physical exam, assessment, and plan.   Prudy Feeler, MD [facility]

## 2019-03-14 NOTE — Patient Instructions (Signed)
  1. Allergen avoidance measures  2. Continue to Treat and prevent inflammation:   A. Advair 230 - 2 inhalations twice a day with spacer  (once symptoms improve you can decrease to 1 puffs twice a day as maintenance)  B. Benralizumab injections every 8 weeks   3. If needed:   A. ProAir HFA or similar 2 inhalations every 4-6 hours  B. nasal saline  C. OTC antihistamine  D. Auvi-Q 0.3  4.  For this recent episode utilize the following:   A.  Claritin/Zyrtec 1 tablet 1-2 times a day  B.  Nasal saline a few times a day  C.  Prednisone 20 mg 1 time per day for 4 days, then 10mg  1 time per day for 1 day   5.  Return to clinic in 4 months or earlier if problem.

## 2019-03-17 DIAGNOSIS — J4550 Severe persistent asthma, uncomplicated: Secondary | ICD-10-CM | POA: Diagnosis not present

## 2019-03-18 ENCOUNTER — Other Ambulatory Visit: Payer: Self-pay

## 2019-03-18 ENCOUNTER — Ambulatory Visit (INDEPENDENT_AMBULATORY_CARE_PROVIDER_SITE_OTHER): Payer: BC Managed Care – PPO | Admitting: *Deleted

## 2019-03-18 DIAGNOSIS — J4550 Severe persistent asthma, uncomplicated: Secondary | ICD-10-CM | POA: Diagnosis not present

## 2019-03-18 DIAGNOSIS — J4551 Severe persistent asthma with (acute) exacerbation: Secondary | ICD-10-CM

## 2019-04-15 ENCOUNTER — Ambulatory Visit: Payer: BLUE CROSS/BLUE SHIELD | Admitting: Allergy and Immunology

## 2019-05-12 DIAGNOSIS — J4550 Severe persistent asthma, uncomplicated: Secondary | ICD-10-CM | POA: Diagnosis not present

## 2019-05-13 ENCOUNTER — Other Ambulatory Visit: Payer: Self-pay

## 2019-05-13 ENCOUNTER — Ambulatory Visit (INDEPENDENT_AMBULATORY_CARE_PROVIDER_SITE_OTHER): Payer: BC Managed Care – PPO

## 2019-05-13 DIAGNOSIS — J4550 Severe persistent asthma, uncomplicated: Secondary | ICD-10-CM | POA: Diagnosis not present

## 2019-05-13 DIAGNOSIS — J4551 Severe persistent asthma with (acute) exacerbation: Secondary | ICD-10-CM

## 2019-05-14 ENCOUNTER — Other Ambulatory Visit: Payer: Self-pay | Admitting: Family Medicine

## 2019-05-14 DIAGNOSIS — I10 Essential (primary) hypertension: Secondary | ICD-10-CM

## 2019-05-14 MED ORDER — LOSARTAN POTASSIUM-HCTZ 100-25 MG PO TABS: 1.0000 | ORAL_TABLET | Freq: Every day | ORAL | 1 refills | Status: DC

## 2019-05-14 NOTE — Telephone Encounter (Signed)
Please review. Thanks!  

## 2019-05-14 NOTE — Telephone Encounter (Signed)
[facility] faxed refill request for the following medications:  losartan-hydrochlorothiazide (HYZAAR) 100-25 MG tablet   Please advise.  

## 2019-05-26 ENCOUNTER — Other Ambulatory Visit: Payer: Self-pay

## 2019-05-26 ENCOUNTER — Ambulatory Visit: Payer: BC Managed Care – PPO | Admitting: Family Medicine

## 2019-05-26 ENCOUNTER — Encounter: Payer: Self-pay | Admitting: Family Medicine

## 2019-05-26 VITALS — BP 117/79 | HR 69 | Temp 97.9°F | Resp 16 | Wt 196.6 lb

## 2019-05-26 DIAGNOSIS — M654 Radial styloid tenosynovitis [de Quervain]: Secondary | ICD-10-CM | POA: Diagnosis not present

## 2019-05-26 DIAGNOSIS — M79644 Pain in right finger(s): Secondary | ICD-10-CM | POA: Diagnosis not present

## 2019-05-26 MED ORDER — NAPROXEN 500 MG PO TABS: 500.0000 mg | ORAL_TABLET | Freq: Two times a day (BID) | ORAL | 1 refills | Status: DC

## 2019-05-26 NOTE — Progress Notes (Signed)
Patient: Gabrielle Terry Female    DOB: Aug 11, 1960   58 y.o.   MRN: [id number] Visit Date: 05/26/2019  Today's Provider: Wilhemena Durie, MD   Chief Complaint  Patient presents with  . Thumb pain   Subjective:    I,Joseline E. Rosas,RMA am acting as a Education administrator for [employer].,MD  HPI  Patient comes in with thumb pain,Right. She is left handed. She also reports pain grip, sometimes. No injury.This has been happening for the past 2-3 weeks.  Patient Declined influenza vaccine.  No Known Allergies   Current Outpatient Medications:  .  albuterol (PROVENTIL) (2.5 MG/3ML) 0.083% nebulizer solution, Take 3 mLs (2.5 mg total) by nebulization every 4 (four) hours as needed for wheezing or shortness of breath., Disp: 150 mL, Rfl: 12 .  amLODipine (NORVASC) 2.5 MG tablet, Take 1 tablet (2.5 mg total) by mouth daily., Disp: 30 tablet, Rfl: 3 .  AUVI-Q 0.3 MG/0.3ML SOAJ injection, Use as directed for life-threatening allergic reaction., Disp: 4 Device, Rfl: 3 .  estradiol (ESTRACE) 1 MG tablet, Take 1 mg daily by mouth., Disp: , Rfl:  .  fluticasone (FLONASE) 50 MCG/ACT nasal spray, Place 2 sprays into both nostrils daily., Disp: 16 g, Rfl: 6 .  fluticasone-salmeterol (ADVAIR HFA) 230-21 MCG/ACT inhaler, Inhale 2 puffs into the lungs 2 (two) times daily., Disp: 12 g, Rfl: 5 .  losartan-hydrochlorothiazide (HYZAAR) 100-25 MG tablet, Take 1 tablet by mouth daily., Disp: 30 tablet, Rfl: 1 .  lovastatin (MEVACOR) 20 MG tablet, Take 1 tablet (20 mg total) by mouth daily., Disp: 30 tablet, Rfl: 1 .  polyethylene glycol (MIRALAX / GLYCOLAX) packet, Take 17 g by mouth daily., Disp: , Rfl:  .  PROAIR HFA 108 (90 Base) MCG/ACT inhaler, INHALE 1 TO 2 PUFFS INTO THE LUNGS EVERY 4 HOURS AS NEEDED FOR WHEEZING, Disp: 8 g, Rfl: 11 .  naproxen (NAPROSYN) 500 MG tablet, Take 1 tablet (500 mg total) by mouth 2 (two) times daily with a meal., Disp: 60 tablet, Rfl: 1  Current Facility-Administered  Medications:  .  Benralizumab SOSY 30 mg, 30 mg, Subcutaneous, Q28 days, Kennith Gain, MD, 30 mg at 05/13/19 1428  Review of Systems  Eyes: Negative for visual disturbance.  Cardiovascular: Negative for chest pain, palpitations and leg swelling.  Musculoskeletal: Positive for arthralgias.  Neurological: Negative for dizziness, light-headedness and headaches.    Social History   Tobacco Use  . Smoking status: Never Smoker  . Smokeless tobacco: Never Used  Substance Use Topics  . Alcohol use: No    Alcohol/week: 1.0 standard drinks    Types: 1 Glasses of wine per week      Objective:   BP 117/79 (BP Location: Right Arm, Patient Position: Sitting, Cuff Size: Large)   Pulse 69   Temp 97.9 F (36.6 C) (Temporal)   Resp 16   Wt 196 lb 9.6 oz (89.2 kg)   BMI 32.72 kg/m  Vitals:   05/26/19 1550  BP: 117/79  Pulse: 69  Resp: 16  Temp: 97.9 F (36.6 C)  TempSrc: Temporal  Weight: 196 lb 9.6 oz (89.2 kg)  Body mass index is 32.72 kg/m.   Physical Exam Vitals signs reviewed.  Constitutional:      Appearance: She is well-developed.  HENT:     Head: Normocephalic and atraumatic.     Right Ear: External ear normal.     Left Ear: External ear normal.  Nose: Nose normal.  Eyes:     General: No scleral icterus.    Conjunctiva/sclera: Conjunctivae normal.  Neck:     Thyroid: No thyromegaly.  Cardiovascular:     Rate and Rhythm: Normal rate and regular rhythm.     Heart sounds: Normal heart sounds.  Pulmonary:     Effort: Pulmonary effort is normal.     Breath sounds: Normal breath sounds.  Abdominal:     Palpations: Abdomen is soft.  Musculoskeletal:     Comments: Mild tenderness over base of right thumb.  The tenderness discomfort extends the extensor tendon of the thumb.  Skin:    General: Skin is warm and dry.  Neurological:     Mental Status: She is alert and oriented to person, place, and time.  Psychiatric:        Mood and Affect: Mood  normal.        Behavior: Behavior normal.        Thought Content: Thought content normal.        Judgment: Judgment normal.      No results found for any visits on 05/26/19.     Assessment & Plan    1. Pain of right thumb  - naproxen (NAPROSYN) 500 MG tablet; Take 1 tablet (500 mg total) by mouth 2 (two) times daily with a meal.  Dispense: 60 tablet; Refill: 1 - DG Hand Complete Right - AMB referral to orthopedics  2. Tendinitis, de Quervain's This is the source of her pain.  At this time will refer to Hand surgery.      Cranford Mon, MD  [facility]

## 2019-06-10 DIAGNOSIS — M1811 Unilateral primary osteoarthritis of first carpometacarpal joint, right hand: Secondary | ICD-10-CM | POA: Diagnosis not present

## 2019-06-10 DIAGNOSIS — M189 Osteoarthritis of first carpometacarpal joint, unspecified: Secondary | ICD-10-CM | POA: Diagnosis not present

## 2019-06-16 ENCOUNTER — Other Ambulatory Visit: Payer: Self-pay | Admitting: Family Medicine

## 2019-06-16 DIAGNOSIS — I10 Essential (primary) hypertension: Secondary | ICD-10-CM

## 2019-06-16 NOTE — Telephone Encounter (Signed)
Approved refill request per protocol, 90 day supply with 1 refill.

## 2019-06-28 DIAGNOSIS — Z20828 Contact with and (suspected) exposure to other viral communicable diseases: Secondary | ICD-10-CM | POA: Diagnosis not present

## 2019-06-28 DIAGNOSIS — R05 Cough: Secondary | ICD-10-CM | POA: Diagnosis not present

## 2019-07-08 ENCOUNTER — Ambulatory Visit (INDEPENDENT_AMBULATORY_CARE_PROVIDER_SITE_OTHER): Payer: BC Managed Care – PPO

## 2019-07-08 ENCOUNTER — Other Ambulatory Visit: Payer: Self-pay

## 2019-07-08 DIAGNOSIS — J4550 Severe persistent asthma, uncomplicated: Secondary | ICD-10-CM

## 2019-07-08 DIAGNOSIS — J4551 Severe persistent asthma with (acute) exacerbation: Secondary | ICD-10-CM

## 2019-07-15 ENCOUNTER — Ambulatory Visit (INDEPENDENT_AMBULATORY_CARE_PROVIDER_SITE_OTHER): Payer: BC Managed Care – PPO | Admitting: Allergy and Immunology

## 2019-07-15 ENCOUNTER — Encounter: Payer: Self-pay | Admitting: Allergy and Immunology

## 2019-07-15 VITALS — BP 128/76 | HR 84 | Temp 97.1°F | Resp 18

## 2019-07-15 DIAGNOSIS — J4550 Severe persistent asthma, uncomplicated: Secondary | ICD-10-CM

## 2019-07-15 DIAGNOSIS — T782XXD Anaphylactic shock, unspecified, subsequent encounter: Secondary | ICD-10-CM | POA: Diagnosis not present

## 2019-07-15 DIAGNOSIS — J3089 Other allergic rhinitis: Secondary | ICD-10-CM

## 2019-07-15 DIAGNOSIS — T63481D Toxic effect of venom of other arthropod, accidental (unintentional), subsequent encounter: Secondary | ICD-10-CM | POA: Diagnosis not present

## 2019-07-15 MED ORDER — ADVAIR HFA 230-21 MCG/ACT IN AERO: 2.0000 | INHALATION_SPRAY | Freq: Two times a day (BID) | RESPIRATORY_TRACT | 5 refills | Status: DC

## 2019-07-15 NOTE — Progress Notes (Signed)
[place] - [place] - [place]   Follow-up Note  Referring Provider: Jerrol Banana.,* Primary Provider: Jerrol Banana., MD Date of Office Visit: 07/15/2019  Subjective:   Gabrielle Terry (DOB: 1961-06-12) is a 58 y.o. female who returns to the [facility] [place] on 07/15/2019 in re-evaluation of the following:  HPI: Gabrielle Terry returns to this clinic in evaluation of eosinophilic driven respiratory tract inflammatory condition in the form of asthma and allergic rhinitis treated with benralizumab and a history of hymenoptera venom hypersensitivity state.  Her last visit to this clinic with me was 15 October 2018.  She did visit with Dr. Nelva Bush on 14 March 2019 with a slight exacerbation of her airway disease requiring a systemic steroid.  Since that last visit she has really done well and has not had any significant problems requiring her to receive a systemic steroid or an antibiotic for her airway and rarely uses a short acting bronchodilator while she continues to use Advair and benralizumab injections.  She continues to carry an injectable epinephrine device for her hymenoptera venom hypersensitivity state.  She does not receive the flu vaccine and will not be receiving the Covid vaccine.  Allergies as of 07/15/2019   No Known Allergies     Medication List      Advair HFA 230-21 MCG/ACT inhaler Generic drug: fluticasone-salmeterol Inhale 2 puffs into the lungs 2 (two) times daily.   amLODipine 2.5 MG tablet Commonly known as: NORVASC TAKE 1 TABLET(2.5 MG) BY MOUTH DAILY   Auvi-Q 0.3 mg/0.3 mL Soaj injection Generic drug: EPINEPHrine Use as directed for life-threatening allergic reaction.   estradiol 1 MG tablet Commonly known as: ESTRACE Take 1 mg daily by mouth.   fluticasone 50 MCG/ACT nasal spray Commonly known as: FLONASE Place 2 sprays into both nostrils daily.   losartan-hydrochlorothiazide 100-25 MG  tablet Commonly known as: HYZAAR Take 1 tablet by mouth daily.   lovastatin 20 MG tablet Commonly known as: MEVACOR Take 1 tablet (20 mg total) by mouth daily.   naproxen 500 MG tablet Commonly known as: NAPROSYN Take 1 tablet (500 mg total) by mouth 2 (two) times daily with a meal.   polyethylene glycol 17 g packet Commonly known as: MIRALAX / GLYCOLAX Take 17 g by mouth daily.   ProAir HFA 108 (90 Base) MCG/ACT inhaler Generic drug: albuterol INHALE 1 TO 2 PUFFS INTO THE LUNGS EVERY 4 HOURS AS NEEDED FOR WHEEZING   albuterol (2.5 MG/3ML) 0.083% nebulizer solution Commonly known as: PROVENTIL Take 3 mLs (2.5 mg total) by nebulization every 4 (four) hours as needed for wheezing or shortness of breath.       Past Medical History:  Diagnosis Date  . Arthritis    knees  . Asthma 2009  . Breast hematoma 2011   Right  . Colon polyp 2013   3  . Cough    from sinus drainage  . Diffuse cystic mastopathy   . Foot cramps   . GERD (gastroesophageal reflux disease)   . Hypercholesteremia   . Hypertension   . Ulcer     Past Surgical History:  Procedure Laterality Date  . ABDOMINAL HYSTERECTOMY  1997  . BREAST SURGERY Right 2011   hematoma  . [place]  . COLONOSCOPY  2013   3 polyps/ Dr Jamal Collin  . COLONOSCOPY WITH PROPOFOL N/A 03/21/2017   Procedure: COLONOSCOPY WITH PROPOFOL;  Surgeon: Christene Lye, MD;  Location:  [facility];  Service: Endoscopy;  Laterality: N/A;  . ESOPHAGOGASTRODUODENOSCOPY (EGD) WITH PROPOFOL N/A 01/26/2016   Procedure: ESOPHAGOGASTRODUODENOSCOPY (EGD) WITH PROPOFOL;  Surgeon: Manya Silvas, MD;  Location: [facility]COPY;  Service: Endoscopy;  Laterality: N/A;  . ETHMOIDECTOMY Bilateral 04/29/2015   Procedure: TOTAL ETHMOIDECTOMY;  Surgeon: Margaretha Sheffield, MD;  Location: [place];  Service: ENT;  Laterality: Bilateral;  . FRONTAL SINUS EXPLORATION Bilateral 04/29/2015   Procedure: FRONTAL SINUS EXPLORATION;   Surgeon: Margaretha Sheffield, MD;  Location: [place];  Service: ENT;  Laterality: Bilateral;  . IMAGE GUIDED SINUS SURGERY N/A 04/29/2015   Procedure: IMAGE GUIDED SINUS SURGERY;  Surgeon: Margaretha Sheffield, MD;  Location: [place];  Service: ENT;  Laterality: N/A;  GAVE DISK TO CE CE  . MAXILLARY ANTROSTOMY Bilateral 04/29/2015   Procedure: MAXILLARY ANTROSTOMY WITH REMOVAL OF CONTENTS;  Surgeon: Margaretha Sheffield, MD;  Location: [place];  Service: ENT;  Laterality: Bilateral;  . MYOMECTOMY  1991  . NASAL SEPTUM SURGERY  2013  . salpingo oophorectmy  1997  . SPHENOIDECTOMY Bilateral 04/29/2015   Procedure: SPHENOIDECTOMY;  Surgeon: Margaretha Sheffield, MD;  Location: [place];  Service: ENT;  Laterality: Bilateral;  . TUBAL LIGATION    . UTERINE FIBROID SURGERY     removed    Review of systems negative except as noted in HPI / PMHx or noted below:  Review of Systems  Constitutional: Negative.   HENT: Negative.   Eyes: Negative.   Respiratory: Negative.   Cardiovascular: Negative.   Gastrointestinal: Negative.   Genitourinary: Negative.   Musculoskeletal: Negative.   Skin: Negative.   Neurological: Negative.   Endo/Heme/Allergies: Negative.   Psychiatric/Behavioral: Negative.      Objective:   Vitals:   07/15/19 1626  BP: 128/76  Pulse: 84  Resp: 18  Temp: (!) 97.1 F (36.2 C)  SpO2: 97%          Physical Exam Constitutional:      Appearance: She is not diaphoretic.  HENT:     Head: Normocephalic.     Right Ear: Tympanic membrane, ear canal and external ear normal.     Left Ear: Tympanic membrane, ear canal and external ear normal.     Nose: Nose normal. No mucosal edema or rhinorrhea.     Mouth/Throat:     Pharynx: Uvula midline. No oropharyngeal exudate.  Eyes:     Conjunctiva/sclera: Conjunctivae normal.  Neck:     Thyroid: No thyromegaly.     Trachea: Trachea normal. No tracheal tenderness or tracheal deviation.  Cardiovascular:      Rate and Rhythm: Normal rate and regular rhythm.     Heart sounds: Normal heart sounds, S1 normal and S2 normal. No murmur.  Pulmonary:     Effort: No respiratory distress.     Breath sounds: Normal breath sounds. No stridor. No wheezing or rales.  Lymphadenopathy:     Head:     Right side of head: No tonsillar adenopathy.     Left side of head: No tonsillar adenopathy.     Cervical: No cervical adenopathy.  Skin:    Findings: No erythema or rash.     Nails: There is no clubbing.  Neurological:     Mental Status: She is alert.     Diagnostics:    Spirometry was performed and demonstrated an FEV1 of 2.43 at 108 % of predicted.  The patient had an Asthma Control Test with the following results: ACT Total Score: 25.  Assessment and Plan:   1. Asthma, severe persistent, well-controlled   2. Other allergic rhinitis   3. Anaphylaxis due to hymenoptera venom, accidental or unintentional, subsequent encounter     1. Allergen avoidance measures  2. Continue to Treat and prevent inflammation:   A. Advair 230 - 2 inhalations 1-2 times a day depending on asthma activity  B. Benralizumab injections every 8 weeks   3. If needed:   A. ProAir HFA or similar 2 inhalations every 4-6 hours  B. nasal saline  C. OTC antihistamine  D. Auvi-Q 0.3  4. Return to clinic in 6 months or earlier if problem.   Leana appears to be doing relatively well.  She can attempt to taper down her Advair to 1 time per day and of course go up to twice a day should it be required.  Her benralizumab injections have really resulted in dramatic improvement regarding her airway control.  Assuming she continues to do well on this plan I will see her back in this clinic in 6 months or earlier if there is a problem.  Allena Katz, MD Allergy / Immunology [place]

## 2019-07-15 NOTE — Patient Instructions (Addendum)
  1. Allergen avoidance measures  2. Continue to Treat and prevent inflammation:   A. Advair 230 - 2 inhalations 1-2 times a day depending on asthma activity  B. Benralizumab injections every 8 weeks   3. If needed:   A. ProAir HFA or similar 2 inhalations every 4-6 hours  B. nasal saline  C. OTC antihistamine  D. Auvi-Q 0.3  4. Return to clinic in 6 months or earlier if problem.

## 2019-07-16 ENCOUNTER — Encounter: Payer: Self-pay | Admitting: Allergy and Immunology

## 2019-07-25 DIAGNOSIS — U071 COVID-19: Secondary | ICD-10-CM

## 2019-07-25 HISTORY — DX: COVID-19: U07.1

## 2019-07-28 ENCOUNTER — Other Ambulatory Visit: Payer: Self-pay | Admitting: Family Medicine

## 2019-07-28 DIAGNOSIS — I10 Essential (primary) hypertension: Secondary | ICD-10-CM

## 2019-07-28 MED ORDER — LOSARTAN POTASSIUM-HCTZ 100-25 MG PO TABS: 1.0000 | ORAL_TABLET | Freq: Every day | ORAL | 1 refills | Status: DC

## 2019-07-28 NOTE — Telephone Encounter (Signed)
[facility] faxed refill request for the following medications:  losartan-hydrochlorothiazide (HYZAAR) 100-25 MG tablet   Please advise.  

## 2019-08-09 ENCOUNTER — Other Ambulatory Visit: Payer: Self-pay | Admitting: Family Medicine

## 2019-08-10 NOTE — Telephone Encounter (Signed)
Requested medication (s) are due for refill today: yes  Requested medication (s) are on the active medication list: yes  Last refill:  12/11/18 #30 with 1 refill  Future visit scheduled: no  Notes to clinic:  Please review for refill. Unable to refill per protocol. Last lipid panel noted to be in 2019    Requested Prescriptions  Pending Prescriptions Disp Refills   lovastatin (MEVACOR) 20 MG tablet [Pharmacy Med Name: LOVASTATIN 20MG  TABLETS] 30 tablet 1    Sig: TAKE 1 TABLET(20 MG) BY MOUTH DAILY      Cardiovascular:  Antilipid - Statins Failed - 08/09/2019  4:07 PM      Failed - Total Cholesterol in normal range and within 360 days    Cholesterol, Total  Date Value Ref Range Status  12/31/2017 180 100 - 199 mg/dL Final          Failed - LDL in normal range and within 360 days    LDL Calculated  Date Value Ref Range Status  12/31/2017 130 (H) 0 - 99 mg/dL Final          Failed - HDL in normal range and within 360 days    HDL  Date Value Ref Range Status  12/31/2017 33 (L) >39 mg/dL Final          Failed - Triglycerides in normal range and within 360 days    Triglycerides  Date Value Ref Range Status  12/31/2017 84 0 - 149 mg/dL Final          Passed - Patient is not pregnant      Passed - Valid encounter within last 12 months    Recent Outpatient Visits           2 months ago Pain of right thumb   [facility] Jerrol Banana., MD   11 months ago Acute non-recurrent maxillary sinusitis   [facility] [place], Vickki Muff, [place]   1 year ago Easy bruising   [facility] Jerrol Banana., MD   1 year ago Mild intermittent asthmatic bronchitis without complication   [facility] Jerrol Banana., MD   2 years ago Seasonal allergic rhinitis due to pollen   [facility] [place], [place], [place]       Future Appointments             In 5 months Kozlow, Donnamarie Poag, MD Allergy and  [place]

## 2019-08-12 ENCOUNTER — Ambulatory Visit: Payer: Self-pay | Admitting: *Deleted

## 2019-08-12 ENCOUNTER — Ambulatory Visit (INDEPENDENT_AMBULATORY_CARE_PROVIDER_SITE_OTHER): Payer: BC Managed Care – PPO | Admitting: Physician Assistant

## 2019-08-12 ENCOUNTER — Ambulatory Visit: Payer: Self-pay | Admitting: Family Medicine

## 2019-08-12 DIAGNOSIS — J4520 Mild intermittent asthma, uncomplicated: Secondary | ICD-10-CM | POA: Diagnosis not present

## 2019-08-12 NOTE — Patient Instructions (Signed)

## 2019-08-12 NOTE — Telephone Encounter (Signed)
  Reason for Disposition . Fever present > 3 days (72 hours)  Answer Assessment - Initial Assessment Questions 1. COVID-19 DIAGNOSIS: "Who made your Coronavirus (COVID-19) diagnosis?" "Was it confirmed by a positive lab test?" If not diagnosed by a HCP, ask "Are there lots of cases (community spread) where you live?" (See public health department website, if unsure)     Negative test from [place] 2. COVID-19 EXPOSURE: "Was there any known exposure to [place] before the symptoms began?" CDC Definition of close contact: within 6 feet (2 meters) for a total of 15 minutes or more over a 24-hour period.      no 3. ONSET: "When did the COVID-19 symptoms start?"      5 days 4. WORST SYMPTOM: "What is your worst symptom?" (e.g., cough, fever, shortness of breath, muscle aches)     No appetite 5. COUGH: "Do you have a cough?" If so, ask: "How bad is the cough?"       Slight cough, non productive 6. FEVER: "Do you have a fever?" If so, ask: "What is your temperature, how was it measured, and when did it start?"     No fever 7. RESPIRATORY STATUS: "Describe your breathing?" (e.g., shortness of breath, wheezing, unable to speak)      none 8. BETTER-SAME-WORSE: "Are you getting better, staying the same or getting worse compared to yesterday?"  If getting worse, ask, "In what way?"     same 9. HIGH RISK DISEASE: "Do you have any chronic medical problems?" (e.g., asthma, heart or lung disease, weak immune system, obesity, etc.)     asthma 10. PREGNANCY: "Is there any chance you are pregnant?" "When was your last menstrual period?"       no 11. OTHER SYMPTOMS: "Do you have any other symptoms?"  (e.g., chills, fatigue, headache, loss of smell or taste, muscle pain, sore throat; new loss of smell or taste especially support the diagnosis of COVID-19)       no  Protocols used: CORONAVIRUS (COVID-19) DIAGNOSED OR SUSPECTED-A-AH

## 2019-08-12 NOTE — Telephone Encounter (Signed)
See other nurse triage encounter.

## 2019-08-12 NOTE — Progress Notes (Addendum)
Patient: Gabrielle Terry Female    DOB: 23-Apr-1961   59 y.o.   MRN: [id number] Visit Date: 08/12/2019  Today's Provider: Trinna Post, PA-C   Chief Complaint  Patient presents with  . URI   Subjective:    Virtual Visit via Telephone Note  I connected with Gabrielle Terry on 08/12/19 at  1:40 PM EST by telephone and verified that I am speaking with the correct person using two identifiers.  Location: Patient: Home Provider: Office   I discussed the limitations, risks, security and privacy concerns of performing an evaluation and management service by telephone and the availability of in person appointments. I also discussed with the patient that there may be a patient responsible charge related to this service. The patient expressed understanding and agreed to proceed.    URI  This is a new problem. The current episode started in the past 7 days. There has been no fever. Associated symptoms include coughing and headaches. Pertinent negatives include no congestion, ear pain, rhinorrhea, sinus pain, sneezing or sore throat. Associated symptoms comments: Pt also having fatigue, chills and no appetite. She has tried nothing for the symptoms.   Pt with history of asthma currently on Advair tested negative for Covid on Friday. Patient reports fevers, chills, myalgias, aches in her hips, difficulty sleeping. Reports chills but no fever. Coughing occasionally that is non productive, denies wheezing. Took some tylenol which was moderately helpful. She was tested for COVID in [place], [place].   No Known Allergies   Current Outpatient Medications:  .  albuterol (PROVENTIL) (2.5 MG/3ML) 0.083% nebulizer solution, Take 3 mLs (2.5 mg total) by nebulization every 4 (four) hours as needed for wheezing or shortness of breath., Disp: 150 mL, Rfl: 12 .  amLODipine (NORVASC) 2.5 MG tablet, TAKE 1 TABLET(2.5 MG) BY MOUTH DAILY, Disp: 90 tablet, Rfl: 1 .  AUVI-Q 0.3 MG/0.3ML SOAJ injection, Use as  directed for life-threatening allergic reaction., Disp: 4 Device, Rfl: 3 .  estradiol (ESTRACE) 1 MG tablet, Take 1 mg daily by mouth., Disp: , Rfl:  .  fluticasone (FLONASE) 50 MCG/ACT nasal spray, Place 2 sprays into both nostrils daily., Disp: 16 g, Rfl: 6 .  fluticasone-salmeterol (ADVAIR HFA) 230-21 MCG/ACT inhaler, Inhale 2 puffs into the lungs 2 (two) times daily., Disp: 12 g, Rfl: 5 .  losartan-hydrochlorothiazide (HYZAAR) 100-25 MG tablet, Take 1 tablet by mouth daily., Disp: 90 tablet, Rfl: 1 .  lovastatin (MEVACOR) 20 MG tablet, TAKE 1 TABLET(20 MG) BY MOUTH DAILY, Disp: 30 tablet, Rfl: 1 .  naproxen (NAPROSYN) 500 MG tablet, Take 1 tablet (500 mg total) by mouth 2 (two) times daily with a meal., Disp: 60 tablet, Rfl: 1 .  polyethylene glycol (MIRALAX / GLYCOLAX) packet, Take 17 g by mouth daily., Disp: , Rfl:  .  PROAIR HFA 108 (90 Base) MCG/ACT inhaler, INHALE 1 TO 2 PUFFS INTO THE LUNGS EVERY 4 HOURS AS NEEDED FOR WHEEZING, Disp: 8 g, Rfl: 11  Current Facility-Administered Medications:  .  Benralizumab SOSY 30 mg, 30 mg, Subcutaneous, Q28 days, Kennith Gain, MD, 30 mg at 07/08/19 1549  Review of Systems  Constitutional: Positive for appetite change, chills and fatigue. Negative for fever.  HENT: Negative for congestion, ear pain, rhinorrhea, sinus pain, sneezing and sore throat.   Eyes: Negative.   Respiratory: Positive for cough.   Cardiovascular: Negative.   Gastrointestinal: Negative.   Endocrine: Negative.   Genitourinary: Negative.   Musculoskeletal:  Negative.   Allergic/Immunologic: Negative.   Neurological: Positive for headaches.  Hematological: Negative.   Psychiatric/Behavioral: Negative.     Social History   Tobacco Use  . Smoking status: Never Smoker  . Smokeless tobacco: Never Used  Substance Use Topics  . Alcohol use: No    Alcohol/week: 1.0 standard drinks    Types: 1 Glasses of wine per week      Objective:   There were no vitals  taken for this visit. There were no vitals filed for this visit.There is no height or weight on file to calculate BMI.   Physical Exam   No results found for any visits on 08/12/19.     Assessment & Plan     1. Mild intermittent asthmatic bronchitis without complication  Discussed self limiting nature of viral URI and recovery time of 10 days. She is not having wheezing or SOB that would indicate asthma exacerbation. Counseled on supportive care and return precautions.  Patient called back and requested a steroid be sent into the pharmacy. Prednisone sent in.   I discussed the assessment and treatment plan with the patient. The patient was provided an opportunity to ask questions and all were answered. The patient agreed with the plan and demonstrated an understanding of the instructions.   The patient was advised to call back or seek an in-person evaluation if the symptoms worsen or if the condition fails to improve as anticipated.  I provided 15 minutes of non-face-to-face time during this encounter.  The entirety of the information documented in the History of Present Illness, Review of Systems and Physical Exam were personally obtained by me. Portions of this information were initially documented by [facility] and reviewed by me for thoroughness and accuracy.      Trinna Post, PA-C  [facility]

## 2019-08-12 NOTE — Telephone Encounter (Addendum)
Message from [place] sent at 08/12/2019 9:43 AM EST  Summary: chills, fatigue, body ache    Patient calling with complaints of chills, fatigue and body aches since Thursday. Patient has tested negative for covid 19 and is requesting to speak with RN.

## 2019-08-13 ENCOUNTER — Telehealth: Payer: Self-pay | Admitting: Family Medicine

## 2019-08-13 MED ORDER — PREDNISONE 20 MG PO TABS: 40.0000 mg | ORAL_TABLET | Freq: Every day | ORAL | 0 refills | Status: DC

## 2019-08-13 NOTE — Telephone Encounter (Signed)
Patient was advised.  

## 2019-08-13 NOTE — Telephone Encounter (Signed)
Prednisone sent

## 2019-08-13 NOTE — Addendum Note (Signed)
Addended by: Trinna Post on: 08/13/2019 03:25 PM   Modules accepted: Orders, Level of Service

## 2019-08-13 NOTE — Telephone Encounter (Signed)
Pt just had an appointment and is asking for medication for her cough and a steroid to be called into the pharmacy/ please advise

## 2019-08-16 ENCOUNTER — Other Ambulatory Visit: Payer: Self-pay

## 2019-08-16 ENCOUNTER — Encounter (HOSPITAL_BASED_OUTPATIENT_CLINIC_OR_DEPARTMENT_OTHER): Payer: Self-pay | Admitting: Emergency Medicine

## 2019-08-16 ENCOUNTER — Inpatient Hospital Stay (HOSPITAL_BASED_OUTPATIENT_CLINIC_OR_DEPARTMENT_OTHER)
Admission: EM | Admit: 2019-08-16 | Discharge: 2019-08-18 | DRG: 177 | Disposition: A | Discharge status: Home / Self Care | Payer: BC Managed Care – PPO | Attending: Internal Medicine | Admitting: Internal Medicine

## 2019-08-16 ENCOUNTER — Emergency Department (HOSPITAL_BASED_OUTPATIENT_CLINIC_OR_DEPARTMENT_OTHER): Payer: BC Managed Care – PPO

## 2019-08-16 DIAGNOSIS — Z90710 Acquired absence of both cervix and uterus: Secondary | ICD-10-CM

## 2019-08-16 DIAGNOSIS — E7800 Pure hypercholesterolemia, unspecified: Secondary | ICD-10-CM | POA: Diagnosis not present

## 2019-08-16 DIAGNOSIS — Z825 Family history of asthma and other chronic lower respiratory diseases: Secondary | ICD-10-CM | POA: Diagnosis not present

## 2019-08-16 DIAGNOSIS — E669 Obesity, unspecified: Secondary | ICD-10-CM | POA: Diagnosis not present

## 2019-08-16 DIAGNOSIS — Z8249 Family history of ischemic heart disease and other diseases of the circulatory system: Secondary | ICD-10-CM

## 2019-08-16 DIAGNOSIS — J189 Pneumonia, unspecified organism: Secondary | ICD-10-CM

## 2019-08-16 DIAGNOSIS — I10 Essential (primary) hypertension: Secondary | ICD-10-CM | POA: Diagnosis present

## 2019-08-16 DIAGNOSIS — E876 Hypokalemia: Secondary | ICD-10-CM | POA: Diagnosis not present

## 2019-08-16 DIAGNOSIS — M170 Bilateral primary osteoarthritis of knee: Secondary | ICD-10-CM | POA: Diagnosis not present

## 2019-08-16 DIAGNOSIS — Z79890 Hormone replacement therapy (postmenopausal): Secondary | ICD-10-CM | POA: Diagnosis not present

## 2019-08-16 DIAGNOSIS — Z9079 Acquired absence of other genital organ(s): Secondary | ICD-10-CM

## 2019-08-16 DIAGNOSIS — Z7951 Long term (current) use of inhaled steroids: Secondary | ICD-10-CM | POA: Diagnosis not present

## 2019-08-16 DIAGNOSIS — E785 Hyperlipidemia, unspecified: Secondary | ICD-10-CM | POA: Diagnosis present

## 2019-08-16 DIAGNOSIS — R0902 Hypoxemia: Secondary | ICD-10-CM

## 2019-08-16 DIAGNOSIS — Z6833 Body mass index (BMI) 33.0-33.9, adult: Secondary | ICD-10-CM

## 2019-08-16 DIAGNOSIS — Z833 Family history of diabetes mellitus: Secondary | ICD-10-CM | POA: Diagnosis not present

## 2019-08-16 DIAGNOSIS — R0602 Shortness of breath: Secondary | ICD-10-CM | POA: Diagnosis not present

## 2019-08-16 DIAGNOSIS — R279 Unspecified lack of coordination: Secondary | ICD-10-CM | POA: Diagnosis not present

## 2019-08-16 DIAGNOSIS — Z791 Long term (current) use of non-steroidal anti-inflammatories (NSAID): Secondary | ICD-10-CM | POA: Diagnosis not present

## 2019-08-16 DIAGNOSIS — Z79899 Other long term (current) drug therapy: Secondary | ICD-10-CM | POA: Diagnosis not present

## 2019-08-16 DIAGNOSIS — Z20822 Contact with and (suspected) exposure to covid-19: Secondary | ICD-10-CM

## 2019-08-16 DIAGNOSIS — J4520 Mild intermittent asthma, uncomplicated: Secondary | ICD-10-CM | POA: Diagnosis present

## 2019-08-16 DIAGNOSIS — I444 Left anterior fascicular block: Secondary | ICD-10-CM | POA: Diagnosis not present

## 2019-08-16 DIAGNOSIS — U071 COVID-19: Secondary | ICD-10-CM | POA: Diagnosis not present

## 2019-08-16 DIAGNOSIS — J1282 Pneumonia due to coronavirus disease 2019: Secondary | ICD-10-CM | POA: Diagnosis not present

## 2019-08-16 DIAGNOSIS — J9601 Acute respiratory failure with hypoxia: Secondary | ICD-10-CM | POA: Diagnosis not present

## 2019-08-16 DIAGNOSIS — E66811 Obesity, class 1: Secondary | ICD-10-CM | POA: Diagnosis present

## 2019-08-16 DIAGNOSIS — Z808 Family history of malignant neoplasm of other organs or systems: Secondary | ICD-10-CM | POA: Diagnosis not present

## 2019-08-16 DIAGNOSIS — Z86010 Personal history of colonic polyps: Secondary | ICD-10-CM

## 2019-08-16 DIAGNOSIS — Z743 Need for continuous supervision: Secondary | ICD-10-CM | POA: Diagnosis not present

## 2019-08-16 DIAGNOSIS — Z8349 Family history of other endocrine, nutritional and metabolic diseases: Secondary | ICD-10-CM | POA: Diagnosis not present

## 2019-08-16 LAB — CBC WITH DIFFERENTIAL/PLATELET
Abs Immature Granulocytes: 0.04 10*3/uL (ref 0.00–0.07)
Basophils Absolute: 0 10*3/uL (ref 0.0–0.1)
Basophils Relative: 0 %
Eosinophils Absolute: 0 10*3/uL (ref 0.0–0.5)
Eosinophils Relative: 0 %
HCT: 44.1 % (ref 36.0–46.0)
Hemoglobin: 14.3 g/dL (ref 12.0–15.0)
Immature Granulocytes: 1 %
Lymphocytes Relative: 18 %
Lymphs Abs: 1.1 10*3/uL (ref 0.7–4.0)
MCH: 27.6 pg (ref 26.0–34.0)
MCHC: 32.4 g/dL (ref 30.0–36.0)
MCV: 85 fL (ref 80.0–100.0)
Monocytes Absolute: 0.4 10*3/uL (ref 0.1–1.0)
Monocytes Relative: 7 %
Neutro Abs: 4.7 10*3/uL (ref 1.7–7.7)
Neutrophils Relative %: 74 %
Platelets: 273 10*3/uL (ref 150–400)
RBC: 5.19 MIL/uL — ABNORMAL HIGH (ref 3.87–5.11)
RDW: 13.2 % (ref 11.5–15.5)
WBC: 6.3 10*3/uL (ref 4.0–10.5)
nRBC: 0 % (ref 0.0–0.2)

## 2019-08-16 LAB — TRIGLYCERIDES: Triglycerides: 227 mg/dL — ABNORMAL HIGH (ref <150)

## 2019-08-16 LAB — COMPREHENSIVE METABOLIC PANEL
ALT: 32 U/L (ref 0–44)
AST: 36 U/L (ref 15–41)
Albumin: 3.6 g/dL (ref 3.5–5.0)
Alkaline Phosphatase: 42 U/L (ref 38–126)
Anion gap: 11 (ref 5–15)
BUN: 24 mg/dL — ABNORMAL HIGH (ref 6–20)
CO2: 23 mmol/L (ref 22–32)
Calcium: 9.1 mg/dL (ref 8.9–10.3)
Chloride: 102 mmol/L (ref 98–111)
Creatinine, Ser: 1.09 mg/dL — ABNORMAL HIGH (ref 0.44–1.00)
GFR calc Af Amer: >60 mL/min (ref >60)
GFR calc non Af Amer: 56 mL/min — ABNORMAL LOW (ref >60)
Glucose, Bld: 121 mg/dL — ABNORMAL HIGH (ref 70–99)
Potassium: 2.8 mmol/L — ABNORMAL LOW (ref 3.5–5.1)
Sodium: 136 mmol/L (ref 135–145)
Total Bilirubin: 0.8 mg/dL (ref 0.3–1.2)
Total Protein: 7.2 g/dL (ref 6.5–8.1)

## 2019-08-16 LAB — C-REACTIVE PROTEIN: CRP: 7.2 mg/dL — ABNORMAL HIGH (ref <1.0)

## 2019-08-16 LAB — LACTATE DEHYDROGENASE: LDH: 260 U/L — ABNORMAL HIGH (ref 98–192)

## 2019-08-16 LAB — PREGNANCY, URINE: Preg Test, Ur: NEGATIVE

## 2019-08-16 LAB — LACTIC ACID, PLASMA
Lactic Acid, Venous: 3.9 mmol/L (ref 0.5–1.9)
Lactic Acid, Venous: 1.3 mmol/L (ref 0.5–1.9)

## 2019-08-16 LAB — FIBRINOGEN: Fibrinogen: 533 mg/dL — ABNORMAL HIGH (ref 210–475)

## 2019-08-16 LAB — BRAIN NATRIURETIC PEPTIDE: B Natriuretic Peptide: 73.7 pg/mL (ref 0.0–100.0)

## 2019-08-16 LAB — PROCALCITONIN: Procalcitonin: <0.1 ng/mL

## 2019-08-16 LAB — SARS CORONAVIRUS 2 BY RT PCR (HOSPITAL ORDER, PERFORMED IN ~~LOC~~ HOSPITAL LAB): SARS Coronavirus 2: POSITIVE — AB

## 2019-08-16 LAB — D-DIMER, QUANTITATIVE: D-Dimer, Quant: 0.49 ug/mL-FEU (ref 0.00–0.50)

## 2019-08-16 LAB — FERRITIN: Ferritin: 478 ng/mL — ABNORMAL HIGH (ref 11–307)

## 2019-08-16 MED ORDER — ONDANSETRON HCL 4 MG PO TABS: 4.0000 mg | ORAL_TABLET | Freq: Four times a day (QID) | ORAL | Status: DC | PRN

## 2019-08-16 MED ORDER — ASCORBIC ACID 500 MG PO TABS
500.0000 mg | ORAL_TABLET | Freq: Every day | ORAL | Status: DC
  Administered 2019-08-17 – 2019-08-18 (×2): 500 mg via ORAL
  Filled 2019-08-16 (×2): qty 1

## 2019-08-16 MED ORDER — DEXAMETHASONE 6 MG PO TABS
6.0000 mg | ORAL_TABLET | ORAL | Status: DC
  Administered 2019-08-17 – 2019-08-18 (×2): 6 mg via ORAL
  Filled 2019-08-16 (×2): qty 1

## 2019-08-16 MED ORDER — ACETAMINOPHEN 325 MG PO TABS: 650.0000 mg | ORAL_TABLET | Freq: Four times a day (QID) | ORAL | Status: DC | PRN

## 2019-08-16 MED ORDER — POTASSIUM CHLORIDE CRYS ER 20 MEQ PO TBCR
40.0000 meq | EXTENDED_RELEASE_TABLET | Freq: Once | ORAL | Status: AC
  Administered 2019-08-16: 15:00:00 40 meq via ORAL
  Filled 2019-08-16: qty 2

## 2019-08-16 MED ORDER — SODIUM CHLORIDE 0.9 % IV SOLN
500.0000 mg | Freq: Once | INTRAVENOUS | Status: AC
  Administered 2019-08-16: 16:00:00 500 mg via INTRAVENOUS
  Filled 2019-08-16: qty 500

## 2019-08-16 MED ORDER — SODIUM CHLORIDE 0.9 % IV SOLN: Freq: Once | INTRAVENOUS | Status: AC

## 2019-08-16 MED ORDER — SODIUM CHLORIDE 0.9 % IV SOLN
1.0000 g | Freq: Once | INTRAVENOUS | Status: AC
  Administered 2019-08-16: 16:00:00 1 g via INTRAVENOUS
  Filled 2019-08-16: qty 10

## 2019-08-16 MED ORDER — SODIUM CHLORIDE 0.9 % IV SOLN
100.0000 mg | INTRAVENOUS | Status: AC
  Administered 2019-08-16 (×2): 100 mg via INTRAVENOUS
  Filled 2019-08-16 (×2): qty 20

## 2019-08-16 MED ORDER — IPRATROPIUM-ALBUTEROL 20-100 MCG/ACT IN AERS
1.0000 | INHALATION_SPRAY | Freq: Four times a day (QID) | RESPIRATORY_TRACT | Status: DC
  Administered 2019-08-17 – 2019-08-18 (×7): 1 via RESPIRATORY_TRACT
  Filled 2019-08-16: qty 4

## 2019-08-16 MED ORDER — ENOXAPARIN SODIUM 40 MG/0.4ML ~~LOC~~ SOLN
40.0000 mg | SUBCUTANEOUS | Status: DC
  Administered 2019-08-17 – 2019-08-18 (×2): 40 mg via SUBCUTANEOUS
  Filled 2019-08-16 (×2): qty 0.4

## 2019-08-16 MED ORDER — ZINC SULFATE 220 (50 ZN) MG PO CAPS
220.0000 mg | ORAL_CAPSULE | Freq: Every day | ORAL | Status: DC
  Administered 2019-08-17 – 2019-08-18 (×2): 220 mg via ORAL
  Filled 2019-08-16 (×2): qty 1

## 2019-08-16 MED ORDER — ACETAMINOPHEN 500 MG PO TABS
1000.0000 mg | ORAL_TABLET | Freq: Once | ORAL | Status: AC
  Administered 2019-08-16: 16:00:00 1000 mg via ORAL
  Filled 2019-08-16: qty 2

## 2019-08-16 MED ORDER — SODIUM CHLORIDE 0.9 % IV SOLN
100.0000 mg | Freq: Every day | INTRAVENOUS | Status: DC
  Administered 2019-08-17 – 2019-08-18 (×2): 100 mg via INTRAVENOUS
  Filled 2019-08-16 (×2): qty 20

## 2019-08-16 MED ORDER — ONDANSETRON HCL 4 MG/2ML IJ SOLN: 4.0000 mg | Freq: Four times a day (QID) | INTRAMUSCULAR | Status: DC | PRN

## 2019-08-16 MED ORDER — DOCUSATE SODIUM 100 MG PO CAPS
100.0000 mg | ORAL_CAPSULE | Freq: Every day | ORAL | Status: DC
  Administered 2019-08-17 – 2019-08-18 (×2): 100 mg via ORAL
  Filled 2019-08-16 (×2): qty 1

## 2019-08-16 MED ORDER — SODIUM CHLORIDE 0.9 % IV SOLN
INTRAVENOUS | Status: DC | PRN
  Administered 2019-08-16: 17:00:00 250 mL via INTRAVENOUS

## 2019-08-16 MED ORDER — HYDROCOD POLST-CPM POLST ER 10-8 MG/5ML PO SUER
5.0000 mL | Freq: Two times a day (BID) | ORAL | Status: DC | PRN
  Administered 2019-08-18: 5 mL via ORAL
  Filled 2019-08-16: qty 5

## 2019-08-16 MED ORDER — GUAIFENESIN-DM 100-10 MG/5ML PO SYRP
10.0000 mL | ORAL_SOLUTION | ORAL | Status: DC | PRN
  Administered 2019-08-18: 08:00:00 10 mL via ORAL
  Filled 2019-08-16 (×2): qty 10

## 2019-08-16 MED ORDER — DEXAMETHASONE SODIUM PHOSPHATE 10 MG/ML IJ SOLN
6.0000 mg | Freq: Once | INTRAMUSCULAR | Status: AC
  Administered 2019-08-16: 14:00:00 6 mg via INTRAVENOUS
  Filled 2019-08-16: qty 1

## 2019-08-16 NOTE — ED Notes (Signed)
Called [facility] spoke to [facility] about bed ready for pt @ 1417

## 2019-08-16 NOTE — ED Provider Notes (Signed)
[facility] EMERGENCY DEPARTMENT Provider Note   CSN: [id number] Arrival date & time: 08/16/19  1324     History Chief Complaint  Patient presents with  . Shortness of Breath    Gabrielle Terry is a 59 y.o. female.  She has a history of asthma and had a virtual visit with her PCP about 4 days ago for shortness of breath.  She was put on prednisone taper.  Since then she has had worsening shortness of breath, cough minimally productive of some yellow-brown sputum, feeling hot and cold, body aches.  No vomiting or diarrhea.  Does have fatigue.  Dyspnea on exertion.  No sick contacts or recent travel. The history is provided by the patient.  Shortness of Breath Severity:  Moderate Onset quality:  Gradual Timing:  Intermittent Progression:  Worsening Chronicity:  New Context: activity   Relieved by:  Nothing Worsened by:  Activity and coughing Ineffective treatments:  Position changes Associated symptoms: cough, fever and sputum production   Associated symptoms: no abdominal pain, no chest pain, no headaches, no hemoptysis, no rash, no sore throat, no syncope, no vomiting and no wheezing   Risk factors: no tobacco use        Past Medical History:  Diagnosis Date  . Arthritis    knees  . Asthma 2009  . Breast hematoma 2011   Right  . Colon polyp 2013   3  . Cough    from sinus drainage  . Diffuse cystic mastopathy   . Foot cramps   . GERD (gastroesophageal reflux disease)   . Hypercholesteremia   . Hypertension   . Ulcer     Patient Active Problem List   Diagnosis Date Noted  . Allergic reaction to insect sting 01/08/2018  . AB (asthmatic bronchitis) 10/14/2015  . Abnormal bruising 10/14/2015  . Toxic effect of venom 10/14/2015  . Airway hyperreactivity 10/14/2015  . Acid reflux 10/14/2015  . Elective abortion 10/14/2015  . Hypercholesteremia 10/14/2015  . Cannot sleep 10/14/2015  . Hemorrhoids, internal 10/14/2015  . Climacteric 10/14/2015  .  Localized superficial swelling, mass, or lump 10/14/2015  . Adiposity 10/14/2015  . Plantar fasciitis 10/14/2015  . BP (high blood pressure) 04/07/2015  . Allergic rhinitis 04/07/2015  . Arthritis of knee, degenerative 12/18/2014  . Diffuse cystic mastopathy 01/28/2014  . Personal history of colonic polyps 01/09/2013    Past Surgical History:  Procedure Laterality Date  . ABDOMINAL HYSTERECTOMY  1997  . BREAST SURGERY Right 2011   hematoma  . [place]  . COLONOSCOPY  2013   3 polyps/ Dr Jamal Collin  . COLONOSCOPY WITH PROPOFOL N/A 03/21/2017   Procedure: COLONOSCOPY WITH PROPOFOL;  Surgeon: Christene Lye, MD;  Location: [facility] ENDOSCOPY;  Service: Endoscopy;  Laterality: N/A;  . ESOPHAGOGASTRODUODENOSCOPY (EGD) WITH PROPOFOL N/A 01/26/2016   Procedure: ESOPHAGOGASTRODUODENOSCOPY (EGD) WITH PROPOFOL;  Surgeon: Manya Silvas, MD;  Location: [facility] ENDOSCOPY;  Service: Endoscopy;  Laterality: N/A;  . ETHMOIDECTOMY Bilateral 04/29/2015   Procedure: TOTAL ETHMOIDECTOMY;  Surgeon: Margaretha Sheffield, MD;  Location: [place];  Service: ENT;  Laterality: Bilateral;  . FRONTAL SINUS EXPLORATION Bilateral 04/29/2015   Procedure: FRONTAL SINUS EXPLORATION;  Surgeon: Margaretha Sheffield, MD;  Location: [place];  Service: ENT;  Laterality: Bilateral;  . IMAGE GUIDED SINUS SURGERY N/A 04/29/2015   Procedure: IMAGE GUIDED SINUS SURGERY;  Surgeon: Margaretha Sheffield, MD;  Location: [place];  Service: ENT;  Laterality: N/A;  GAVE DISK TO CE  CE  . MAXILLARY ANTROSTOMY Bilateral 04/29/2015   Procedure: MAXILLARY ANTROSTOMY WITH REMOVAL OF CONTENTS;  Surgeon: Margaretha Sheffield, MD;  Location: [place];  Service: ENT;  Laterality: Bilateral;  . MYOMECTOMY  1991  . NASAL SEPTUM SURGERY  2013  . salpingo oophorectmy  1997  . SPHENOIDECTOMY Bilateral 04/29/2015   Procedure: SPHENOIDECTOMY;  Surgeon: Margaretha Sheffield, MD;  Location: [place];  Service: ENT;   Laterality: Bilateral;  . TUBAL LIGATION    . UTERINE FIBROID SURGERY     removed     OB History    Gravida  2   Para  2   Term      Preterm      AB      Living  2     SAB      TAB      Ectopic      Multiple      Live Births           Obstetric Comments  Menstrual: 17 Age 1st Pregnancy: 62        Family History  Adopted: Yes  Problem Relation Age of Onset  . Congestive Heart Failure Father   . Cancer Brother   . Other Brother        brain tumor  . Hypertension Maternal Aunt   . Thyroid disease Maternal Aunt   . Diabetes Maternal Grandmother   . Asthma Son   . Angioedema Neg Hx   . Allergic rhinitis Neg Hx   . Atopy Neg Hx   . Eczema Neg Hx   . Immunodeficiency Neg Hx   . Urticaria Neg Hx     Social History   Tobacco Use  . Smoking status: Never Smoker  . Smokeless tobacco: Never Used  Substance Use Topics  . Alcohol use: No    Alcohol/week: 1.0 standard drinks    Types: 1 Glasses of wine per week  . Drug use: No    Home Medications Prior to Admission medications   Medication Sig Start Date End Date Taking? Authorizing Provider  albuterol (PROVENTIL) (2.5 MG/3ML) 0.083% nebulizer solution Take 3 mLs (2.5 mg total) by nebulization every 4 (four) hours as needed for wheezing or shortness of breath. 12/31/17   Jerrol Banana., MD  amLODipine (NORVASC) 2.5 MG tablet TAKE 1 TABLET(2.5 MG) BY MOUTH DAILY 06/16/19   Jerrol Banana., MD  AUVI-Q 0.3 MG/0.3ML SOAJ injection Use as directed for life-threatening allergic reaction. 05/29/17   Kozlow, Donnamarie Poag, MD  estradiol (ESTRACE) 1 MG tablet Take 1 mg daily by mouth.    [provider]  fluticasone (FLONASE) 50 MCG/ACT nasal spray Place 2 sprays into both nostrils daily. 11/06/16   Carmon Ginsberg, PA  fluticasone-salmeterol (ADVAIR HFA) 230-21 MCG/ACT inhaler Inhale 2 puffs into the lungs 2 (two) times daily. 07/15/19   Kozlow, Donnamarie Poag, MD  losartan-hydrochlorothiazide (HYZAAR)  100-25 MG tablet Take 1 tablet by mouth daily. 07/28/19   Jerrol Banana., MD  lovastatin (MEVACOR) 20 MG tablet TAKE 1 TABLET(20 MG) BY MOUTH DAILY 08/11/19   Jerrol Banana., MD  naproxen (NAPROSYN) 500 MG tablet Take 1 tablet (500 mg total) by mouth 2 (two) times daily with a meal. 05/26/19   Jerrol Banana., MD  polyethylene glycol [facility] / Floria Raveling) packet Take 17 g by mouth daily.    [provider]  predniSONE (DELTASONE) 20 MG tablet Take 2 tablets (40 mg total) by mouth  daily with breakfast for 5 days. 08/13/19 08/18/19  Trinna Post, PA-C  PROAIR HFA 108 [phone] Base) MCG/ACT inhaler INHALE 1 TO 2 PUFFS INTO THE LUNGS EVERY 4 HOURS AS NEEDED FOR WHEEZING 02/06/17   Jerrol Banana., MD    Allergies    Patient has no known allergies.  Review of Systems   Review of Systems  Constitutional: Positive for chills, fatigue and fever.  HENT: Negative for sore throat.   Eyes: Negative for visual disturbance.  Respiratory: Positive for cough, sputum production and shortness of breath. Negative for hemoptysis and wheezing.   Cardiovascular: Negative for chest pain and syncope.  Gastrointestinal: Negative for abdominal pain and vomiting.  Genitourinary: Negative for dysuria.  Musculoskeletal: Positive for myalgias.  Skin: Negative for rash.  Neurological: Negative for headaches.    Physical Exam Updated Vital Signs BP 123/77 (BP Location: Right Arm)   Pulse 84   Resp (!) 24   Ht 5\' 5"  (1.651 m)   Wt 92.1 kg   SpO2 92%   BMI 33.78 kg/m   Physical Exam Vitals and nursing note reviewed.  Constitutional:      General: She is not in acute distress.    Appearance: She is well-developed.  HENT:     Head: Normocephalic and atraumatic.  Eyes:     Conjunctiva/sclera: Conjunctivae normal.  Cardiovascular:     Rate and Rhythm: Normal rate and regular rhythm.     Heart sounds: No murmur.  Pulmonary:     Effort: Pulmonary effort is normal. No  respiratory distress.     Breath sounds: Normal breath sounds.  Abdominal:     Palpations: Abdomen is soft.     Tenderness: There is no abdominal tenderness.  Musculoskeletal:        General: Normal range of motion.     Cervical back: Neck supple.     Right lower leg: No tenderness. No edema.     Left lower leg: No tenderness. No edema.  Skin:    General: Skin is warm and dry.     Capillary Refill: Capillary refill takes less than 2 seconds.  Neurological:     General: No focal deficit present.     Mental Status: She is alert.     ED Results / Procedures / Treatments   Labs (all labs ordered are listed, but only abnormal results are displayed) Labs Reviewed  SARS CORONAVIRUS 2 BY RT PCR ([facility]) - Abnormal; Notable for the following components:      Result Value   SARS Coronavirus 2 POSITIVE (*)    All other components within normal limits  LACTIC ACID, PLASMA - Abnormal; Notable for the following components:   Lactic Acid, Venous 3.9 (*)    All other components within normal limits  CBC WITH DIFFERENTIAL/PLATELET - Abnormal; Notable for the following components:   RBC 5.19 (*)    All other components within normal limits  COMPREHENSIVE METABOLIC PANEL - Abnormal; Notable for the following components:   Potassium 2.8 (*)    Glucose, Bld 121 (*)    BUN 24 (*)    Creatinine, Ser 1.09 (*)    GFR calc non Af Amer 56 (*)    All other components within normal limits  LACTATE DEHYDROGENASE - Abnormal; Notable for the following components:   LDH 260 (*)    All other components within normal limits  FERRITIN - Abnormal; Notable for the following components:   Ferritin  478 (*)    All other components within normal limits  TRIGLYCERIDES - Abnormal; Notable for the following components:   Triglycerides 227 (*)    All other components within normal limits  C-REACTIVE PROTEIN - Abnormal; Notable for the following components:   CRP 7.2  (*)    All other components within normal limits  FIBRINOGEN - Abnormal; Notable for the following components:   Fibrinogen 533 (*)    All other components within normal limits  CULTURE, BLOOD (ROUTINE X 2)  CULTURE, BLOOD (ROUTINE X 2)  LACTIC ACID, PLASMA  PROCALCITONIN  PREGNANCY, URINE  BRAIN NATRIURETIC PEPTIDE  D-DIMER, QUANTITATIVE (NOT AT [facility])    EKG EKG Interpretation  Date/Time:  Saturday August 16 2019 13:40:11 EST Ventricular Rate:  84 PR Interval:    QRS Duration: 96 QT Interval:  398 QTC Calculation: 471 R Axis:   -49 Text Interpretation: Sinus rhythm Left anterior fascicular block Anterior infarct, age indeterminate No old tracing to compare Confirmed by Aletta Edouard [phone]) on 08/16/2019 1:42:42 PM   Radiology DG Chest Port 1 View  Result Date: 08/16/2019 CLINICAL DATA:  Shortness of breath EXAM: PORTABLE CHEST 1 VIEW COMPARISON:  04/08/2013 FINDINGS: Bilateral patchy interstitial and alveolar airspace disease. No pleural effusion or pneumothorax. Stable cardiomediastinal silhouette. No aggressive osseous lesion. IMPRESSION: Bilateral patchy interstitial and alveolar airspace disease as can be seen with multilobar pneumonia including atypical viral pneumonia. Electronically Signed   By: Kathreen Devoid   On: 08/16/2019 14:50    Procedures .Critical Care Performed by: Hayden Rasmussen, MD Authorized by: Hayden Rasmussen, MD   Critical care provider statement:    Critical care time (minutes):  45   Critical care was necessary to treat or prevent imminent or life-threatening deterioration of the following conditions:  Respiratory failure   Critical care was time spent personally by me on the following activities:  Discussions with consultants, evaluation of patient's response to treatment, examination of patient, ordering and performing treatments and interventions, ordering and review of laboratory studies, ordering and review of radiographic studies, pulse  oximetry, re-evaluation of patient's condition, obtaining history from patient or surrogate, review of old charts and development of treatment plan with patient or surrogate   I assumed direction of critical care for this patient from another provider in my specialty: no     (including critical care time)  Medications Ordered in ED Medications  cefTRIAXone (ROCEPHIN) 1 g in sodium chloride 0.9 % 100 mL IVPB (has no administration in time range)  azithromycin (ZITHROMAX) 500 mg in sodium chloride 0.9 % 250 mL IVPB (has no administration in time range)  dexamethasone (DECADRON) injection 6 mg (6 mg Intravenous Given 08/16/19 1405)  potassium chloride SA (KLOR-CON) CR tablet 40 mEq (40 mEq Oral Given 08/16/19 1446)  0.9 %  sodium chloride infusion ( Intravenous New Bag/Given 08/16/19 1506)    ED Course  I have reviewed the triage vital signs and the nursing notes.  Pertinent labs & imaging results that were available during my care of the patient were reviewed by me and considered in my medical decision making (see chart for details).  Clinical Course as of Aug 15 1522  Sat Aug 15, 4165  4227 59 year old female history of asthma here with increased shortness of breath feeling hot and cold dyspnea on exertion minimally productive cough. Differential includes Covid, bacterial pneumonia, PE, pneumothorax, CHF. Initial set walking back from the bathroom was 85% on room air. Now satting in the mid 22s  at rest on 2 L nasal cannula.   [MB]  [id number] Chest x-ray interpreted by me as probable atelectasis in the left lung. Awaiting radiology reading.   [MB]  [id number] Patient's lactate elevated at 3.9.  She is afebrile here not tachycardic not hyper or hypotensive.  Respirations mildly elevated.  Likely related to work of breathing and not sepsis.  Am holding antibiotics for now until procalcitonin back.  No indication for aggressive fluids.   [MB]  [id number] I updated the patient and she is comfortable with plan.   Have paged the hospitalist.   [MB]  1523 Discussed with [facility] hospitalist Dr. Horris Latino.  She is excepting the patient for admission to [facility].  We discussed the elevated lactate and the pneumonia and agreed that we would start her on antibiotics until her pro calcitonin is back.  Giving IV fluids for the elevated lactate.  To recheck the lactate.   [MB]    Clinical Course User Index [MB] Hayden Rasmussen, MD   MDM Rules/Calculators/A&P                     HALAYAH RAPPE was evaluated in Emergency Department on 08/16/2019 for the symptoms described in the history of present illness. She was evaluated in the context of the global COVID-19 pandemic, which necessitated consideration that the patient might be at risk for infection with the SARS-CoV-2 virus that causes COVID-19. Institutional protocols and algorithms that pertain to the evaluation of patients at risk for COVID-19 are in a state of rapid change based on information released by regulatory bodies including the CDC and federal and state organizations. These policies and algorithms were followed during the patient's care in the ED.   Final Clinical Impression(s) / ED Diagnoses Final diagnoses:  Hypoxia  Multifocal pneumonia  Person under investigation for COVID-19  Hypokalemia    Rx / DC Orders ED Discharge Orders    None       Hayden Rasmussen, MD 08/16/19 1725

## 2019-08-16 NOTE — ED Notes (Signed)
Date and time results received: 08/16/19 1458   Test: lactic acid Critical Value: 3.9  Name of Provider Notified: Dr. Melina Copa Orders Received? Or Actions Taken?: no orders given

## 2019-08-16 NOTE — H&P (Signed)
TRH H&P   Patient Demographics:    Gabrielle Terry, is a 59 y.o. female  MRN: [id number]   DOB - [birth year]  Admit Date - 08/16/2019  Outpatient Primary MD for the patient is Jerrol Banana., MD  Patient coming from: [facility]  Chief Complaint  Patient presents with  . Shortness of Breath      HPI:    Gabrielle Terry  is a 59 y.o. female, with obesity, HTN, HLD, asthma presents as a transfer from [facility] for management of respiratory failure with hypoxia secondary to SARS-CoV-2.  She has been having URI symptoms for about a week, went to her PCP and was prescribed a prednisone taper.  She has taken medication as prescribed without significant improvement in her SOB.  Shortness of breath was worsening to include at rest prompting the ER presentation, CXR revealed bilateral infiltrates consistent with viral pneumonia.  Admits to a cough which is nonproductive.  She was found to be hypoxic and transferred for further management.  She denies any sick contacts.  No recent travel.  She denies any palpitations, orthopnea or PND.  No lower extremity edema.   Review of systems:  Review of Systems:  Constitutional: negative for anorexia, chills, fatigue or fevers HEENT: negative for earaches, epistaxis, or sore throat Respiratory: see HPI Cardiovascular: negative for chest pain, palpitations, or syncope GU: negative for dysuria, urinary frequency, urinary urgency, hematuria Gastrointestinal: negative for abdominal pain, constipation, diarrhea, nausea or vomiting Musculoskeletal: negative for arthralgias, back pain or myalgias Neurological: negative for dizziness, headaches or weakness Behavioral/Psych: negative for suicidal or  homicidal ideation Skin:negative for rash Heme: negative for bruises Endo: negative for hair loss, weight gain/loss  With Past History of the following :   Past Medical History:  Diagnosis Date  . Arthritis    knees  . Asthma 2009  . Breast hematoma 2011   Right  . Colon polyp 2013   3  . Cough    from sinus drainage  . Diffuse cystic mastopathy   . Foot cramps   . GERD (gastroesophageal reflux disease)   . Hypercholesteremia   . Hypertension   . Ulcer      Past Surgical History:  Procedure Laterality Date  . ABDOMINAL HYSTERECTOMY  1997  . BREAST SURGERY Right 2011   hematoma  . CESAREAN SECTION  1985, 1992  . COLONOSCOPY  2013   3 polyps/ Dr Jamal Collin  . COLONOSCOPY WITH PROPOFOL N/A 03/21/2017   Procedure: COLONOSCOPY WITH PROPOFOL;  Surgeon: Christene Lye, MD;  Location: [facility] ENDOSCOPY;  Service: Endoscopy;  Laterality: N/A;  . ESOPHAGOGASTRODUODENOSCOPY (EGD) WITH PROPOFOL N/A 01/26/2016   Procedure: ESOPHAGOGASTRODUODENOSCOPY (EGD) WITH PROPOFOL;  Surgeon: Manya Silvas, MD;  Location: [facility] ENDOSCOPY;  Service: Endoscopy;  Laterality: N/A;  . ETHMOIDECTOMY Bilateral 04/29/2015   Procedure: TOTAL ETHMOIDECTOMY;  Surgeon: Margaretha Sheffield, MD;  Location: [place];  Service: ENT;  Laterality: Bilateral;  . FRONTAL SINUS EXPLORATION Bilateral 04/29/2015   Procedure: FRONTAL SINUS EXPLORATION;  Surgeon: Margaretha Sheffield, MD;  Location: [place];  Service: ENT;  Laterality: Bilateral;  . IMAGE GUIDED SINUS SURGERY N/A 04/29/2015   Procedure: IMAGE GUIDED SINUS SURGERY;  Surgeon: Margaretha Sheffield, MD;  Location: [place];  Service: ENT;  Laterality: N/A;  GAVE DISK TO CE CE  . MAXILLARY ANTROSTOMY Bilateral 04/29/2015   Procedure: MAXILLARY ANTROSTOMY WITH REMOVAL OF CONTENTS;  Surgeon: Margaretha Sheffield, MD;  Location: [place];  Service: ENT;  Laterality: Bilateral;  . MYOMECTOMY  1991  . NASAL SEPTUM SURGERY  2013  . salpingo oophorectmy   1997  . SPHENOIDECTOMY Bilateral 04/29/2015   Procedure: SPHENOIDECTOMY;  Surgeon: Margaretha Sheffield, MD;  Location: [place];  Service: ENT;  Laterality: Bilateral;  . TUBAL LIGATION    . UTERINE FIBROID SURGERY     removed    Social History:    Social History   Tobacco Use  . Smoking status: Never Smoker  . Smokeless tobacco: Never Used  Substance Use Topics  . Alcohol use: No    Alcohol/week: 1.0 standard drinks    Types: 1 Glasses of wine per week     Family History :    Family History  Adopted: Yes  Problem Relation Age of Onset  . Congestive Heart Failure Father   . Cancer Brother   . Other Brother        brain tumor  . Hypertension Maternal Aunt   . Thyroid disease Maternal Aunt   . Diabetes Maternal Grandmother   . Asthma Son   . Angioedema Neg Hx   . Allergic rhinitis Neg Hx   . Atopy Neg Hx   . Eczema Neg Hx   . Immunodeficiency Neg Hx   . Urticaria Neg Hx     Home Medications:   Prior to Admission medications   Medication Sig Start Date End Date Taking? Authorizing Provider  amLODipine (NORVASC) 2.5 MG tablet TAKE 1 TABLET(2.5 MG) BY MOUTH DAILY 06/16/19  Yes Jerrol Banana., MD  estradiol (ESTRACE) 1 MG tablet Take 1 mg daily by mouth.   Yes [provider]  losartan-hydrochlorothiazide (HYZAAR) 100-25 MG tablet Take 1 tablet by mouth daily. 07/28/19  Yes Jerrol Banana., MD  lovastatin (MEVACOR) 20 MG tablet TAKE 1 TABLET(20 MG) BY MOUTH DAILY 08/11/19  Yes Jerrol Banana., MD  predniSONE (DELTASONE) 20 MG tablet Take 2 tablets (40 mg total) by mouth daily with breakfast for 5 days. 08/13/19 08/18/19 Yes Pollak, Wendee Beavers, PA-C  albuterol (PROVENTIL) (2.5 MG/3ML) 0.083% nebulizer solution Take 3 mLs (2.5 mg total) by nebulization every 4 (four) hours as needed for wheezing or shortness of breath. 12/31/17   Jerrol Banana., MD  AUVI-Q 0.3 MG/0.3ML SOAJ injection Use as directed for life-threatening allergic reaction.  05/29/17   Kozlow, Donnamarie Poag,  MD  fluticasone (FLONASE) 50 MCG/ACT nasal spray Place 2 sprays into both nostrils daily. 11/06/16   Carmon Ginsberg, PA  fluticasone-salmeterol (ADVAIR HFA) 230-21 MCG/ACT inhaler Inhale 2 puffs into the lungs 2 (two) times daily. 07/15/19   Kozlow, Donnamarie Poag, MD  naproxen (NAPROSYN) 500 MG tablet Take 1 tablet (500 mg total) by mouth 2 (two) times daily with a meal. 05/26/19   Jerrol Banana., MD  polyethylene glycol [facility] / Floria Raveling) packet Take 17 g by mouth daily.    [provider]  PROAIR HFA [phone] Base) MCG/ACT inhaler INHALE 1 TO 2 PUFFS INTO THE LUNGS EVERY 4 HOURS AS NEEDED FOR WHEEZING 02/06/17   Jerrol Banana., MD    Allergies:    No Known Allergies   Physical Exam:   Vitals  Blood pressure (!) 140/91, pulse 71, temperature 98.2 F (36.8 C), temperature source Oral, resp. rate 18, height 5\' 5"  (1.651 m), weight 92.1 kg, SpO2 95 %.  Physical Exam   Constitutional - resting comfortably, no acute distress Eyes - pupils equal round and reactive to light and accomodation, extra ocular movements intact Nose - no gross deformity or drainage Mouth - no oral lesions noted Throat - no swelling or erythema Neck - supple, no JVD   CV - (+)S1S2, no murmurs  Resp - bilateral lower lung field rales,  GI - (+)BS, soft, non-tender, non-distended Extrem - no clubbing, cyanosis, or peripheral edema  Skin - no rashes or wounds Neuro - alert, aware, oriented to person/place/time  Psych - normal affect, no anxiety   Patient has Pressure Ulcer on Admission?: no   Data Review:    CBC Recent Labs  Lab 08/16/19 1358  WBC 6.3  HGB 14.3  HCT 44.1  PLT 273  MCV 85.0  MCH 27.6  MCHC 32.4  RDW 13.2  LYMPHSABS 1.1  MONOABS 0.4  EOSABS 0.0  BASOSABS 0.0   ------------------------------------------------------------------------------------------------------------------  Chemistries  Recent Labs  Lab 08/16/19 1358  NA 136  K 2.8*    CL 102  CO2 23  GLUCOSE 121*  BUN 24*  CREATININE 1.09*  CALCIUM 9.1  AST 36  ALT 32  ALKPHOS 42  BILITOT 0.8   ------------------------------------------------------------------------------------------------------------------ estimated creatinine clearance is 63.1 mL/min (A) (by C-G formula based on SCr of 1.09 mg/dL (H)). ------------------------------------------------------------------------------------------------------------------ No results for input(s): TSH, T4TOTAL, T3FREE, THYROIDAB in the last 72 hours.  Invalid input(s): FREET3  Coagulation profile No results for input(s): INR, PROTIME in the last 168 hours. ------------------------------------------------------------------------------------------------------------------- Recent Labs    08/16/19 1358  DDIMER 0.49   -------------------------------------------------------------------------------------------------------------------  Cardiac Enzymes No results for input(s): CKMB, TROPONINI, MYOGLOBIN in the last 168 hours.  Invalid input(s): CK ------------------------------------------------------------------------------------------------------------------    Component Value Date/Time   BNP 73.7 08/16/2019 1358     ---------------------------------------------------------------------------------------------------------------  Urinalysis No results found for: COLORURINE, APPEARANCEUR, LABSPEC, PHURINE, GLUCOSEU, HGBUR, BILIRUBINUR, KETONESUR, PROTEINUR, UROBILINOGEN, NITRITE, LEUKOCYTESUR  ----------------------------------------------------------------------------------------------------------------   Imaging Results:    DG Chest Port 1 View  Result Date: 08/16/2019 CLINICAL DATA:  Shortness of breath EXAM: PORTABLE CHEST 1 VIEW COMPARISON:  04/08/2013 FINDINGS: Bilateral patchy interstitial and alveolar airspace disease. No pleural effusion or pneumothorax. Stable cardiomediastinal silhouette. No  aggressive osseous lesion. IMPRESSION: Bilateral patchy interstitial and alveolar airspace disease as can be seen with multilobar pneumonia including atypical viral pneumonia. Electronically Signed   By: Kathreen Devoid   On: 08/16/2019 14:50    Assessment & Plan:    Principal Problem:  Acute hypoxemic respiratory failure due to severe acute respiratory syndrome coronavirus 2 (SARS-CoV-2) disease (HCC) Active Problems:   Pneumonia due to COVID-19 virus   Mild intermittent asthma   Hypercholesteremia   Obesity (BMI 30.0-34.9)   Essential hypertension     Acute hypoxemic respiratory failure due to SARS-CoV-2 disease/Pneumonia due to COVID-19 virus: Patient with mild intermittent asthma presented with 1 week of worsening shortness of breath found to be in respiratory failure with hypoxia attributed to SARS-CoV-2 pneumonia. Date of Dx: 08/16/19 Oxygen requirements: 4 LPM Antibiotics: Not indicated, WBC and procalcitonin WNL. Diuretics: Not indicated at this time clinically euvolemic.  Continue to monitor. Vitamin C and Zinc: Per protocol Remdesivir: Started on 1/23 Steroids: Started on 1/23 Actemra: Not given yet Convalescent Plasma: Not given yet    Mild intermittent asthma: Patient with mild intermittent asthma, uses albuterol HandiHaler, no previous admission for asthma exacerbation.  No evidence of acute asthma exacerbation at this time.  On steroids as above.  Scheduled Combivent.    Hypercholesteremia: Cardiac diet.  Resume home medications when verified by pharmacy.    Obesity (BMI 30.0-34.9):Obesity affects all facets of care.  Likely secondary to excessive caloric intake.  Encourage patient to reduce caloric intake while increasing physical activity and engage in other lifestyle changes that may result in weight loss.     Essential hypertension: Blood pressure is close to goal.  Resume home medications once verified by pharmacy.  DVT Prophylaxis Lovenox  AM Labs Ordered, also  please review Full Orders  Family Communication: Admission, patients condition and plan of care including tests being ordered have been discussed with the patient who indicate understanding and agree with the plan and Code Status.  Code Status Full Code  Likely DC to  Home  Condition GUARDED    Consults called: none    Admission status: Admit to inpatient    Time spent in minutes : 22   Peyton Bottoms M.D on 08/16/2019 at 10:37 PM  To page go to [email or web address] - password [facility]

## 2019-08-16 NOTE — ED Notes (Signed)
XR at bedside

## 2019-08-16 NOTE — ED Triage Notes (Addendum)
Cough, SOB with exertion, altered taste x 1 week. Pt O2 sats 85% after ambulating to the bathroom and then the room. After sitting down sats came quickly up to 92%

## 2019-08-16 NOTE — ED Notes (Signed)
Pt denies otc meds pta

## 2019-08-16 NOTE — ED Notes (Addendum)
Called [facility] to advise that pt is Covid + @ [id number] spoke to [employer]

## 2019-08-17 DIAGNOSIS — E876 Hypokalemia: Secondary | ICD-10-CM

## 2019-08-17 DIAGNOSIS — R0902 Hypoxemia: Secondary | ICD-10-CM

## 2019-08-17 LAB — COMPREHENSIVE METABOLIC PANEL
ALT: 33 U/L (ref 0–44)
AST: 26 U/L (ref 15–41)
Albumin: 3.5 g/dL (ref 3.5–5.0)
Alkaline Phosphatase: 38 U/L (ref 38–126)
Anion gap: 12 (ref 5–15)
BUN: 19 mg/dL (ref 6–20)
CO2: 27 mmol/L (ref 22–32)
Calcium: 9.1 mg/dL (ref 8.9–10.3)
Chloride: 103 mmol/L (ref 98–111)
Creatinine, Ser: 0.69 mg/dL (ref 0.44–1.00)
GFR calc Af Amer: >60 mL/min (ref >60)
GFR calc non Af Amer: >60 mL/min (ref >60)
Glucose, Bld: 117 mg/dL — ABNORMAL HIGH (ref 70–99)
Potassium: 3.2 mmol/L — ABNORMAL LOW (ref 3.5–5.1)
Sodium: 142 mmol/L (ref 135–145)
Total Bilirubin: 0.7 mg/dL (ref 0.3–1.2)
Total Protein: 7 g/dL (ref 6.5–8.1)

## 2019-08-17 LAB — CBC WITH DIFFERENTIAL/PLATELET
Abs Immature Granulocytes: 0.05 10*3/uL (ref 0.00–0.07)
Basophils Absolute: 0 10*3/uL (ref 0.0–0.1)
Basophils Relative: 1 %
Eosinophils Absolute: 0 10*3/uL (ref 0.0–0.5)
Eosinophils Relative: 0 %
HCT: 39.5 % (ref 36.0–46.0)
Hemoglobin: 13 g/dL (ref 12.0–15.0)
Immature Granulocytes: 1 %
Lymphocytes Relative: 17 %
Lymphs Abs: 1 10*3/uL (ref 0.7–4.0)
MCH: 27.8 pg (ref 26.0–34.0)
MCHC: 32.9 g/dL (ref 30.0–36.0)
MCV: 84.6 fL (ref 80.0–100.0)
Monocytes Absolute: 0.4 10*3/uL (ref 0.1–1.0)
Monocytes Relative: 7 %
Neutro Abs: 4.4 10*3/uL (ref 1.7–7.7)
Neutrophils Relative %: 74 %
Platelets: 277 10*3/uL (ref 150–400)
RBC: 4.67 MIL/uL (ref 3.87–5.11)
RDW: 13.3 % (ref 11.5–15.5)
WBC: 5.9 10*3/uL (ref 4.0–10.5)
nRBC: 0 % (ref 0.0–0.2)

## 2019-08-17 LAB — HIV ANTIBODY (ROUTINE TESTING W REFLEX): HIV Screen 4th Generation wRfx: NONREACTIVE

## 2019-08-17 LAB — D-DIMER, QUANTITATIVE: D-Dimer, Quant: 0.53 ug/mL-FEU — ABNORMAL HIGH (ref 0.00–0.50)

## 2019-08-17 LAB — C-REACTIVE PROTEIN: CRP: 4.1 mg/dL — ABNORMAL HIGH (ref <1.0)

## 2019-08-17 LAB — FERRITIN: Ferritin: 388 ng/mL — ABNORMAL HIGH (ref 11–307)

## 2019-08-17 MED ORDER — MOMETASONE FURO-FORMOTEROL FUM 200-5 MCG/ACT IN AERO
2.0000 | INHALATION_SPRAY | Freq: Two times a day (BID) | RESPIRATORY_TRACT | Status: DC
  Administered 2019-08-17 – 2019-08-18 (×2): 2 via RESPIRATORY_TRACT
  Filled 2019-08-17: qty 8.8

## 2019-08-17 MED ORDER — POTASSIUM CHLORIDE CRYS ER 20 MEQ PO TBCR
40.0000 meq | EXTENDED_RELEASE_TABLET | Freq: Once | ORAL | Status: AC
  Administered 2019-08-17: 40 meq via ORAL
  Filled 2019-08-17: qty 2

## 2019-08-17 MED ORDER — PRAVASTATIN SODIUM 10 MG PO TABS
10.0000 mg | ORAL_TABLET | Freq: Every day | ORAL | Status: DC
  Administered 2019-08-17: 10 mg via ORAL
  Filled 2019-08-17: qty 1

## 2019-08-17 MED ORDER — AMLODIPINE BESYLATE 5 MG PO TABS
2.5000 mg | ORAL_TABLET | Freq: Every day | ORAL | Status: DC
  Administered 2019-08-17 – 2019-08-18 (×2): 2.5 mg via ORAL
  Filled 2019-08-17 (×2): qty 1

## 2019-08-17 NOTE — Plan of Care (Signed)
  Problem: Education: Goal: Knowledge of risk factors and measures for prevention of condition will improve Outcome: Progressing   

## 2019-08-17 NOTE — Progress Notes (Signed)
PROGRESS NOTE  Gabrielle Terry [id number] DOB: 02-Nov-1960 DOA: 08/16/2019 PCP: Jerrol Banana., MD   LOS: 1 day   Brief Narrative / Interim history: 59 year old female with history of hypertension, asthma, hyperlipidemia, obesity, who presents to the hospital with complaints of shortness of breath.  This has been going on for the past several days and so a virtual visit with her PCP about 4 days ago and was put on prednisone taper.  Her symptoms have progressed since, she was also experienced fatigue, and presented to the hospital.  She was diagnosed with COVID-19 and a chest x-ray on admission showed bilateral airspace disease.  Subjective / 24h Interval events: She is doing well this morning, still coughing and complains of mild shortness of breath  Assessment & Plan:  Principal Problem Acute Hypoxic Respiratory Failure due to Covid-19 Viral Illness -Patient hypoxic on admission currently requiring 2 L nasal cannula, when I evaluated patient in the room she appears to be on room air at rest -Started on remdesivir, continue -Continue steroids with dexamethasone -Continue supportive treatment, incentive spirometry, flutter valve   COVID-19 Labs  Recent Labs    08/16/19 1358  DDIMER 0.49  FERRITIN 478*  LDH 260*  CRP 7.2*    Lab Results  Component Value Date   SARSCOV2NAA POSITIVE (A) 08/16/2019    Active Problems History of underlying asthma -No wheezing, continue inhalers  Hyperlipidemia -Resume statin as soon as pharmacy reconciled  Essential hypertension -At home he is on losartan/HCTZ and Norvasc, resume medications as soon as pharmacy reconciled  Obesity -Based on a BMI of 33.7  Scheduled Meds: . vitamin C  500 mg Oral Daily  . dexamethasone  6 mg Oral Q24H  . docusate sodium  100 mg Oral Daily  . enoxaparin (LOVENOX) injection  40 mg Subcutaneous Q24H  . Ipratropium-Albuterol  1 puff Inhalation Q6H  . zinc sulfate  220 mg Oral Daily    Continuous Infusions: . sodium chloride 250 mL (08/16/19 1727)  . remdesivir 100 mg in NS 100 mL     PRN Meds:.sodium chloride, acetaminophen, chlorpheniramine-HYDROcodone, guaiFENesin-dextromethorphan, ondansetron **OR** ondansetron (ZOFRAN) IV  DVT prophylaxis: Lovenox Code Status: Full code Family Communication: Discussed with patient, all questions answered Patient admitted from: Home Anticipated d/c place: Home Barriers to d/c: Persistent hypoxia, requirement of intravenous therapies  Consultants:  None   Procedures:  None   Microbiology: None   Antimicrobials: None    Objective: Vitals:   08/17/19 0300 08/17/19 0400 08/17/19 0500 08/17/19 0525  BP:    123/82  Pulse: (!) 47 (!) 48 (!) 49 73  Resp: 16 16 15 19   Temp:    98.4 F (36.9 C)  TempSrc:    Oral  SpO2: 97% 98% 97% 95%  Weight:      Height:        Intake/Output Summary (Last 24 hours) at 08/17/2019 [id number] Last data filed at 08/16/2019 2102 Gross per 24 hour  Intake 1465.82 ml  Output --  Net 1465.82 ml   Filed Weights   08/16/19 1334  Weight: 92.1 kg    Examination:  Constitutional: NAD Eyes: no scleral icterus ENMT: Mucous membranes are moist.  Neck: normal, supple Respiratory: Diminished at both bases but mostly clear to auscultation bilaterally, no wheezing, no crackles. Normal respiratory effort. No accessory muscle use.  Cardiovascular: Regular rate and rhythm, no murmurs / rubs / gallops. No LE edema.  Abdomen: Nondistended, bowel sounds positive Musculoskeletal: no clubbing / cyanosis.  Skin: no  rashes appreciated Neurologic: Grossly nonfocal  Data Reviewed: I have independently reviewed following labs and imaging studies   CBC: Recent Labs  Lab 08/16/19 1358  WBC 6.3  NEUTROABS 4.7  HGB 14.3  HCT 44.1  MCV 85.0  PLT [id number]   Basic Metabolic Panel: Recent Labs  Lab 08/16/19 1358  NA 136  K 2.8*  CL 102  CO2 23  GLUCOSE 121*  BUN 24*  CREATININE 1.09*  CALCIUM 9.1    GFR: Estimated Creatinine Clearance: 63.1 mL/min (A) (by C-G formula based on SCr of 1.09 mg/dL (H)). Liver Function Tests: Recent Labs  Lab 08/16/19 1358  AST 36  ALT 32  ALKPHOS 42  BILITOT 0.8  PROT 7.2  ALBUMIN 3.6   No results for input(s): LIPASE, AMYLASE in the last 168 hours. No results for input(s): AMMONIA in the last 168 hours. Coagulation Profile: No results for input(s): INR, PROTIME in the last 168 hours. Cardiac Enzymes: No results for input(s): CKTOTAL, CKMB, CKMBINDEX, TROPONINI in the last 168 hours. BNP (last 3 results) No results for input(s): PROBNP in the last 8760 hours. HbA1C: No results for input(s): HGBA1C in the last 72 hours. CBG: No results for input(s): GLUCAP in the last 168 hours. Lipid Profile: Recent Labs    08/16/19 1358  TRIG 227*   Thyroid Function Tests: No results for input(s): TSH, T4TOTAL, FREET4, T3FREE, THYROIDAB in the last 72 hours. Anemia Panel: Recent Labs    08/16/19 1358  FERRITIN 478*   Urine analysis: No results found for: COLORURINE, APPEARANCEUR, LABSPEC, PHURINE, GLUCOSEU, HGBUR, BILIRUBINUR, KETONESUR, PROTEINUR, UROBILINOGEN, NITRITE, LEUKOCYTESUR Sepsis Labs: Invalid input(s): PROCALCITONIN, LACTICIDVEN  Recent Results (from the past 240 hour(s))  SARS Coronavirus 2 by RT PCR (hospital order, performed in [facility] hospital lab) Nasopharyngeal Nasopharyngeal Swab     Status: Abnormal   Collection Time: 08/16/19  2:04 PM   Specimen: Nasopharyngeal Swab  Result Value Ref Range Status   SARS Coronavirus 2 POSITIVE (A) NEGATIVE Final    Comment: RESULT CALLED TO, READ BACK BY AND VERIFIED WITH: Laurena Slimmer RN [phone] PHILLIPS C Performed at [facility], [address] [place], [place] [address]       Radiology Studies: DG Chest Port 1 View  Result Date: 08/16/2019 CLINICAL DATA:  Shortness of breath EXAM: PORTABLE CHEST 1 VIEW COMPARISON:  04/08/2013 FINDINGS: Bilateral patchy  interstitial and alveolar airspace disease. No pleural effusion or pneumothorax. Stable cardiomediastinal silhouette. No aggressive osseous lesion. IMPRESSION: Bilateral patchy interstitial and alveolar airspace disease as can be seen with multilobar pneumonia including atypical viral pneumonia. Electronically Signed   By: Kathreen Devoid   On: 08/16/2019 14:50    Marzetta Board, MD, PhD [facility]  Between 7 am - 7 pm I am available, please contact me via Amion or Securechat  Between 7 pm - 7 am I am not available, please contact night coverage MD/APP via Amion

## 2019-08-18 LAB — CBC WITH DIFFERENTIAL/PLATELET
Abs Immature Granulocytes: 0.09 10*3/uL — ABNORMAL HIGH (ref 0.00–0.07)
Basophils Absolute: 0 10*3/uL (ref 0.0–0.1)
Basophils Relative: 0 %
Eosinophils Absolute: 0 10*3/uL (ref 0.0–0.5)
Eosinophils Relative: 0 %
HCT: 40.2 % (ref 36.0–46.0)
Hemoglobin: 13.2 g/dL (ref 12.0–15.0)
Immature Granulocytes: 2 %
Lymphocytes Relative: 17 %
Lymphs Abs: 1 10*3/uL (ref 0.7–4.0)
MCH: 28 pg (ref 26.0–34.0)
MCHC: 32.8 g/dL (ref 30.0–36.0)
MCV: 85.2 fL (ref 80.0–100.0)
Monocytes Absolute: 0.3 10*3/uL (ref 0.1–1.0)
Monocytes Relative: 6 %
Neutro Abs: 4.6 10*3/uL (ref 1.7–7.7)
Neutrophils Relative %: 75 %
Platelets: 329 10*3/uL (ref 150–400)
RBC: 4.72 MIL/uL (ref 3.87–5.11)
RDW: 13.3 % (ref 11.5–15.5)
WBC: 6 10*3/uL (ref 4.0–10.5)
nRBC: 0 % (ref 0.0–0.2)

## 2019-08-18 LAB — COMPREHENSIVE METABOLIC PANEL
ALT: 36 U/L (ref 0–44)
AST: 24 U/L (ref 15–41)
Albumin: 3.6 g/dL (ref 3.5–5.0)
Alkaline Phosphatase: 36 U/L — ABNORMAL LOW (ref 38–126)
Anion gap: 11 (ref 5–15)
BUN: 22 mg/dL — ABNORMAL HIGH (ref 6–20)
CO2: 26 mmol/L (ref 22–32)
Calcium: 9.3 mg/dL (ref 8.9–10.3)
Chloride: 105 mmol/L (ref 98–111)
Creatinine, Ser: 0.83 mg/dL (ref 0.44–1.00)
GFR calc Af Amer: >60 mL/min (ref >60)
GFR calc non Af Amer: >60 mL/min (ref >60)
Glucose, Bld: 116 mg/dL — ABNORMAL HIGH (ref 70–99)
Potassium: 3.7 mmol/L (ref 3.5–5.1)
Sodium: 142 mmol/L (ref 135–145)
Total Bilirubin: 0.7 mg/dL (ref 0.3–1.2)
Total Protein: 6.9 g/dL (ref 6.5–8.1)

## 2019-08-18 LAB — FERRITIN: Ferritin: 380 ng/mL — ABNORMAL HIGH (ref 11–307)

## 2019-08-18 LAB — D-DIMER, QUANTITATIVE: D-Dimer, Quant: 0.55 ug/mL-FEU — ABNORMAL HIGH (ref 0.00–0.50)

## 2019-08-18 LAB — C-REACTIVE PROTEIN: CRP: 2 mg/dL — ABNORMAL HIGH (ref <1.0)

## 2019-08-18 MED ORDER — DEXAMETHASONE 6 MG PO TABS: 6.0000 mg | ORAL_TABLET | Freq: Every day | ORAL | 0 refills | Status: DC

## 2019-08-18 NOTE — Progress Notes (Signed)
Patient scheduled for outpatient Remdesivir infusion at 530PM on Tuesday 1/26 and Wednesday 1/27. Please advise them to report to [facility] at [address].  Drive to the security guard and tell them you are here for an infusion. They will direct you to the front entrance where we will come and get you.  For questions call [phone].  Thanks   

## 2019-08-18 NOTE — Discharge Summary (Signed)
Physician Discharge Summary  Gabrielle Terry [id number] DOB: 03-30-1961 DOA: 08/16/2019  PCP: Jerrol Banana., MD  Admit date: 08/16/2019 Discharge date: 08/18/2019  Admitted From: home Disposition:  home  Recommendations for Outpatient Follow-up:  1. Follow up with PCP in 1-2 weeks 2. Patient was set up in the outpatient infusion center to receive last 2 doses of Remdesivir 3. She will complete Decadron with 8 additional days remaining as an outpatient  Home Health: None Equipment/Devices: None  Discharge Condition: Stable CODE STATUS: Full code Diet recommendation: Regular  HPI: Per admitting MD, Gabrielle Terry  is a 59 y.o. female, with obesity, HTN, HLD, asthma presents as a transfer from [facility] for management of respiratory failure with hypoxia secondary to SARS-CoV-2.  She has been having URI symptoms for about a week, went to her PCP and was prescribed a prednisone taper.  She has taken medication as prescribed without significant improvement in her SOB.  Shortness of breath was worsening to include at rest prompting the ER presentation, CXR revealed bilateral infiltrates consistent with viral pneumonia.  Admits to a cough which is nonproductive.  She was found to be hypoxic and transferred for further management.  She denies any sick contacts.  No recent travel.  She denies any palpitations, orthopnea or PND.  No lower extremity edema.  Hospital Course / Discharge diagnoses: Acute Hypoxic Respiratory Failure due to Covid-19 Viral Illness -patient was admitted to the hospital with COVID-19 pneumonia/viral illness.  She was minimally hypoxic on 2 L nasal cannula in the emergency room however after admission she was able to be weaned off to room air.  She was started on remdesivir, clinically improving and returned to baseline.  She is now requiring additional oxygen, comfortable, able to ambulate without difficulties, and will be discharged home in stable condition.  She was set up  to the outpatient remdesivir infusion center to finish treatment with 2 additional doses remaining.  She was also placed on steroids, and she is to complete 10 total days of Decadron as an outpatient.  Her inflammatory markers are improving  COVID-19 Labs  Recent Labs    08/16/19 1358 08/17/19 0600 08/18/19 0308  DDIMER 0.49 0.53* 0.55*  FERRITIN 478* 388* 380*  LDH 260*  --   --   CRP 7.2* 4.1* 2.0*    Lab Results  Component Value Date   SARSCOV2NAA POSITIVE (A) 08/16/2019   History of underlying asthma -No wheezing,  Hyperlipidemia -Resume home meds Essential hypertension -resume home meds  Discharge Instructions   Allergies as of 08/18/2019   No Known Allergies     Medication List    STOP taking these medications   predniSONE 20 MG tablet Commonly known as: DELTASONE     TAKE these medications   Advair HFA 230-21 MCG/ACT inhaler Generic drug: fluticasone-salmeterol Inhale 2 puffs into the lungs 2 (two) times daily.   amLODipine 2.5 MG tablet Commonly known as: NORVASC TAKE 1 TABLET(2.5 MG) BY MOUTH DAILY   Auvi-Q 0.3 mg/0.3 mL Soaj injection Generic drug: EPINEPHrine Use as directed for life-threatening allergic reaction.   dexamethasone 6 MG tablet Commonly known as: DECADRON Take 1 tablet (6 mg total) by mouth daily.   estradiol 1 MG tablet Commonly known as: ESTRACE Take 1 mg daily by mouth.   fluticasone 50 MCG/ACT nasal spray Commonly known as: FLONASE Place 2 sprays into both nostrils daily.   losartan-hydrochlorothiazide 100-25 MG tablet Commonly known as: HYZAAR Take 1 tablet by mouth daily.  lovastatin 20 MG tablet Commonly known as: MEVACOR TAKE 1 TABLET(20 MG) BY MOUTH DAILY What changed: See the new instructions.   naproxen 500 MG tablet Commonly known as: NAPROSYN Take 1 tablet (500 mg total) by mouth 2 (two) times daily with a meal. What changed:   when to take this  reasons to take this   polyethylene glycol 17 g  packet Commonly known as: MIRALAX / GLYCOLAX Take 17 g by mouth daily.   ProAir HFA 108 (90 Base) MCG/ACT inhaler Generic drug: albuterol INHALE 1 TO 2 PUFFS INTO THE LUNGS EVERY 4 HOURS AS NEEDED FOR WHEEZING   albuterol (2.5 MG/3ML) 0.083% nebulizer solution Commonly known as: PROVENTIL Take 3 mLs (2.5 mg total) by nebulization every 4 (four) hours as needed for wheezing or shortness of breath.        Consultations:  None   Procedures/Studies:  DG Chest Port 1 View  Result Date: 08/16/2019 CLINICAL DATA:  Shortness of breath EXAM: PORTABLE CHEST 1 VIEW COMPARISON:  04/08/2013 FINDINGS: Bilateral patchy interstitial and alveolar airspace disease. No pleural effusion or pneumothorax. Stable cardiomediastinal silhouette. No aggressive osseous lesion. IMPRESSION: Bilateral patchy interstitial and alveolar airspace disease as can be seen with multilobar pneumonia including atypical viral pneumonia. Electronically Signed   By: Kathreen Devoid   On: 08/16/2019 14:50      Subjective: - no chest pain, shortness of breath, no abdominal pain, nausea or vomiting.   Discharge Exam: BP 116/81 (BP Location: Left Arm)   Pulse 88   Temp 98 F (36.7 C) (Oral)   Resp 18   Ht 5\' 5"  (1.651 m)   Wt 92.1 kg   SpO2 99%   BMI 33.78 kg/m   General: Pt is alert, awake, not in acute distress Cardiovascular: RRR, S1/S2 +, no rubs, no gallops Respiratory: CTA bilaterally, no wheezing, no rhonchi Abdominal: Soft, NT, ND, bowel sounds + Extremities: no edema, no cyanosis    The results of significant diagnostics from this hospitalization (including imaging, microbiology, ancillary and laboratory) are listed below for reference.     Microbiology: Recent Results (from the past 240 hour(s))  Blood Culture (routine x 2)     Status: None (Preliminary result)   Collection Time: 08/16/19  2:00 PM   Specimen: BLOOD RIGHT ARM  Result Value Ref Range Status   Specimen Description   Final    BLOOD  RIGHT ARM Performed at [facility], [address] [place], [place] [address]    Special Requests   Final    BOTTLES DRAWN AEROBIC AND ANAEROBIC Blood Culture adequate volume Performed at [facility], [address] [place], [place] [address]    Culture   Final    NO GROWTH 2 DAYS Performed at [facility], [place] [address] [place], [place] [address]    Report Status PENDING  Incomplete  SARS Coronavirus 2 by RT PCR (hospital order, performed in [facility] hospital lab) Nasopharyngeal Nasopharyngeal Swab     Status: Abnormal   Collection Time: 08/16/19  2:04 PM   Specimen: Nasopharyngeal Swab  Result Value Ref Range Status   SARS Coronavirus 2 POSITIVE (A) NEGATIVE Final    Comment: RESULT CALLED TO, READ BACK BY AND VERIFIED WITH: Laurena Slimmer RN [phone] PHILLIPS C Performed at [facility], [address] [place], [place] [address]   Blood Culture (routine x 2)     Status: None (Preliminary result)   Collection  Time: 08/16/19  2:15 PM   Specimen: BLOOD RIGHT WRIST  Result Value Ref Range Status   Specimen Description   Final    BLOOD RIGHT WRIST Performed at [facility], [address] [place], [place] [address]    Special Requests   Final    BOTTLES DRAWN AEROBIC AND ANAEROBIC Blood Culture adequate volume Performed at [facility], [address] [place], [place] [address]    Culture   Final    NO GROWTH 2 DAYS Performed at [facility], [place] [address] [place], [place] [address]    Report Status PENDING  Incomplete     Labs: Basic Metabolic Panel: Recent Labs  Lab 08/16/19 1358 08/17/19 0600 08/18/19 0308  NA 136 142 142  K 2.8* 3.2* 3.7  CL 102 103 105  CO2 23 27 26   GLUCOSE 121* 117* 116*  BUN 24* 19 22*  CREATININE 1.09* 0.69 0.83  CALCIUM 9.1 9.1 9.3   Liver Function Tests: Recent Labs  Lab 08/16/19 1358 08/17/19 0600 08/18/19 0308  AST 36 26 24  ALT 32 33 36  ALKPHOS 42  38 36*  BILITOT 0.8 0.7 0.7  PROT 7.2 7.0 6.9  ALBUMIN 3.6 3.5 3.6   CBC: Recent Labs  Lab 08/16/19 1358 08/17/19 0600 08/18/19 0308  WBC 6.3 5.9 6.0  NEUTROABS 4.7 4.4 4.6  HGB 14.3 13.0 13.2  HCT 44.1 39.5 40.2  MCV 85.0 84.6 85.2  PLT 273 277 329   CBG: No results for input(s): GLUCAP in the last 168 hours. Hgb A1c No results for input(s): HGBA1C in the last 72 hours. Lipid Profile Recent Labs    08/16/19 1358  TRIG 227*   Thyroid function studies No results for input(s): TSH, T4TOTAL, T3FREE, THYROIDAB in the last 72 hours.  Invalid input(s): FREET3 Urinalysis No results found for: COLORURINE, APPEARANCEUR, LABSPEC, PHURINE, GLUCOSEU, HGBUR, BILIRUBINUR, KETONESUR, PROTEINUR, UROBILINOGEN, NITRITE, LEUKOCYTESUR  FURTHER DISCHARGE INSTRUCTIONS:   Get Medicines reviewed and adjusted: Please take all your medications with you for your next visit with your Primary MD   Laboratory/radiological data: Please request your Primary MD to go over all hospital tests and procedure/radiological results at the follow up, please ask your Primary MD to get all Hospital records sent to his/her office.   In some cases, they will be blood work, cultures and biopsy results pending at the time of your discharge. Please request that your primary care M.D. goes through all the records of your hospital data and follows up on these results.   Also Note the following: If you experience worsening of your admission symptoms, develop shortness of breath, life threatening emergency, suicidal or homicidal thoughts you must seek medical attention immediately by calling 911 or calling your MD immediately  if symptoms less severe.   You must read complete instructions/literature along with all the possible adverse reactions/side effects for all the Medicines you take and that have been prescribed to you. Take any new Medicines after you have completely understood and accpet all the possible adverse  reactions/side effects.    Do not drive when taking Pain medications or sleeping medications (Benzodaizepines)   Do not take more than prescribed Pain, Sleep and Anxiety Medications. It is not advisable to combine anxiety,sleep and pain medications without talking with your primary care practitioner   Special Instructions: If you have smoked or chewed Tobacco  in the last 2 yrs please stop smoking, stop any regular Alcohol  and or any Recreational drug use.   Wear Seat belts while driving.   Please note: You were cared for by a hospitalist during your hospital stay. Once you are discharged, your primary care physician will handle any further medical issues. Please note that NO REFILLS for any discharge medications will be authorized once you are discharged, as it is imperative that you return to your primary care physician (or establish a relationship with a primary care physician if you do not have one) for your post hospital discharge needs so that they can reassess your need for medications and monitor your lab values.  Time coordinating discharge: 40 minutes  SIGNED:  Marzetta Board, MD, PhD 08/18/2019, 10:45 AM

## 2019-08-18 NOTE — Plan of Care (Signed)
  Problem: Education: Goal: Knowledge of risk factors and measures for prevention of condition will improve Outcome: Progressing   

## 2019-08-18 NOTE — Plan of Care (Signed)
  Problem: Education: Goal: Knowledge of risk factors and measures for prevention of condition will improve 08/18/2019 1617 by Levie Heritage, RN Outcome: Adequate for Discharge 08/18/2019 1616 by Levie Heritage, RN Outcome: Progressing 08/18/2019 0739 by Levie Heritage, RN Outcome: Progressing

## 2019-08-18 NOTE — Plan of Care (Signed)
  Problem: Education: Goal: Knowledge of risk factors and measures for prevention of condition will improve 08/18/2019 1616 by Levie Heritage, RN Outcome: Progressing 08/18/2019 0739 by Levie Heritage, RN Outcome: Progressing

## 2019-08-18 NOTE — Progress Notes (Signed)
PIV removed d/c paperwork explained. No further questions. Scripts given to patient. No signs of respiratory distress 96% RA. Walked in hallway 5-6 laps without oxygen or SOB. Will transport to car via wheelchair.

## 2019-08-18 NOTE — Plan of Care (Signed)
  Problem: Education: Goal: Knowledge of risk factors and measures for prevention of condition will improve Outcome: Progressing   Problem: Coping: Goal: Psychosocial and spiritual needs will be supported Outcome: Progressing   Problem: Respiratory: Goal: Will maintain a patent airway Outcome: Progressing Goal: Complications related to the disease process, condition or treatment will be avoided or minimized Outcome: Progressing   Problem: Education: Goal: Knowledge of General Education information will improve Description: Including pain rating scale, medication(s)/side effects and non-pharmacologic comfort measures Outcome: Progressing   Problem: Health Behavior/Discharge Planning: Goal: Ability to manage health-related needs will improve Outcome: Progressing   Problem: Clinical Measurements: Goal: Ability to maintain clinical measurements within normal limits will improve Outcome: Progressing Goal: Will remain free from infection Outcome: Progressing Goal: Cardiovascular complication will be avoided Outcome: Progressing   Problem: Activity: Goal: Risk for activity intolerance will decrease Outcome: Progressing   Problem: Nutrition: Goal: Adequate nutrition will be maintained Outcome: Progressing   Problem: Pain Managment: Goal: General experience of comfort will improve Outcome: Progressing   Problem: Safety: Goal: Ability to remain free from injury will improve Outcome: Progressing   Problem: Skin Integrity: Goal: Risk for impaired skin integrity will decrease Outcome: Progressing

## 2019-08-18 NOTE — Discharge Instructions (Addendum)
You are scheduled for an outpatient infusion of Remdesivir at 530PM on Tuesday 1/26 and Wednesday 1/27.Marland Kitchen  Please report to [facility] at [address]  Drive to the security guard and tell them you are here for an infusion. They will direct you to the front entrance where we will come and get you.  For questions call [phone].  Thanks      Follow with Jerrol Banana., MD in 2 weeks  Please get a complete blood count and chemistry panel checked by your Primary MD at your next visit, and again as instructed by your Primary MD. Please get your medications reviewed and adjusted by your Primary MD.  Please request your Primary MD to go over all Hospital Tests and Procedure/Radiological results at the follow up, please get all Hospital records sent to your Prim MD by signing hospital release before you go home.  In some cases, there will be blood work, cultures and biopsy results pending at the time of your discharge. Please request that your primary care M.D. goes through all the records of your hospital data and follows up on these results.  If you had Pneumonia of Lung problems at the Hospital: Please get a 2 view Chest X ray done in 6-8 weeks after hospital discharge or sooner if instructed by your Primary MD.  If you have Congestive Heart Failure: Please call your Cardiologist or Primary MD anytime you have any of the following symptoms:  1) 3 pound weight gain in 24 hours or 5 pounds in 1 week  2) shortness of breath, with or without a dry hacking cough  3) swelling in the hands, feet or stomach  4) if you have to sleep on extra pillows at night in order to breathe  Follow cardiac low salt diet and 1.5 lit/day fluid restriction.  If you have diabetes Accuchecks 4 times/day, Once in AM empty stomach and then before each meal. Log in all results and show them to your primary doctor at your next visit. If any glucose reading is under 80 or above 300 call your  primary MD immediately.  If you have Seizure/Convulsions/Epilepsy: Please do not drive, operate heavy machinery, participate in activities at heights or participate in high speed sports until you have seen by Primary MD or a Neurologist and advised to do so again. Per [facility] statutes, patients with seizures are not allowed to drive until they have been seizure-free for six months.  Use caution when using heavy equipment or power tools. Avoid working on ladders or at heights. Take showers instead of baths. Ensure the water temperature is not too high on the home water heater. Do not go swimming alone. Do not lock yourself in a room alone (i.e. bathroom). When caring for infants or small children, sit down when holding, feeding, or changing them to minimize risk of injury to the child in the event you have a seizure. Maintain good sleep hygiene. Avoid alcohol.   If you had Gastrointestinal Bleeding: Please ask your Primary MD to check a complete blood count within one week of discharge or at your next visit. Your endoscopic/colonoscopic biopsies that are pending at the time of discharge, will also need to followed by your Primary MD.  Get Medicines reviewed and adjusted. Please take all your medications with you for your next visit with your Primary MD  Please request your Primary MD to go over all hospital tests and procedure/radiological results at the follow up,  please ask your Primary MD to get all Hospital records sent to his/her office.  If you experience worsening of your admission symptoms, develop shortness of breath, life threatening emergency, suicidal or homicidal thoughts you must seek medical attention immediately by calling 911 or calling your MD immediately  if symptoms less severe.  You must read complete instructions/literature along with all the possible adverse reactions/side effects for all the Medicines you take and that have been prescribed to you. Take any new  Medicines after you have completely understood and accpet all the possible adverse reactions/side effects.   Do not drive or operate heavy machinery when taking Pain medications.   Do not take more than prescribed Pain, Sleep and Anxiety Medications  Special Instructions: If you have smoked or chewed Tobacco  in the last 2 yrs please stop smoking, stop any regular Alcohol  and or any Recreational drug use.  Wear Seat belts while driving.  Please note You were cared for by a hospitalist during your hospital stay. If you have any questions about your discharge medications or the care you received while you were in the hospital after you are discharged, you can call the unit and asked to speak with the hospitalist on call if the hospitalist that took care of you is not available. Once you are discharged, your primary care physician will handle any further medical issues. Please note that NO REFILLS for any discharge medications will be authorized once you are discharged, as it is imperative that you return to your primary care physician (or establish a relationship with a primary care physician if you do not have one) for your aftercare needs so that they can reassess your need for medications and monitor your lab values.  You can reach the hospitalist office at phone [phone] or fax [phone]   If you do not have a primary care physician, you can call [phone] for a physician referral.  Activity: As tolerated with Full fall precautions use walker/cane & assistance as needed    Diet: regular  Disposition Home

## 2019-08-19 ENCOUNTER — Ambulatory Visit (HOSPITAL_COMMUNITY)
Admission: RE | Admit: 2019-08-19 | Discharge: 2019-08-19 | Disposition: A | Discharge status: Home / Self Care | Payer: BC Managed Care – PPO | Source: Ambulatory Visit | Attending: Pulmonary Disease | Admitting: Pulmonary Disease

## 2019-08-19 ENCOUNTER — Telehealth: Payer: Self-pay

## 2019-08-19 DIAGNOSIS — U071 COVID-19: Secondary | ICD-10-CM | POA: Diagnosis not present

## 2019-08-19 MED ORDER — ALBUTEROL SULFATE HFA 108 (90 BASE) MCG/ACT IN AERS: 2.0000 | INHALATION_SPRAY | Freq: Once | RESPIRATORY_TRACT | Status: DC | PRN

## 2019-08-19 MED ORDER — SODIUM CHLORIDE 0.9 % IV SOLN
INTRAVENOUS | Status: DC | PRN
  Administered 2019-08-19: 18:00:00 250 mL via INTRAVENOUS

## 2019-08-19 MED ORDER — SODIUM CHLORIDE 0.9 % IV SOLN: INTRAVENOUS | Status: DC | PRN

## 2019-08-19 MED ORDER — METHYLPREDNISOLONE SODIUM SUCC 125 MG IJ SOLR: 125.0000 mg | Freq: Once | INTRAMUSCULAR | Status: DC | PRN

## 2019-08-19 MED ORDER — DIPHENHYDRAMINE HCL 50 MG/ML IJ SOLN: 50.0000 mg | Freq: Once | INTRAMUSCULAR | Status: DC | PRN

## 2019-08-19 MED ORDER — EPINEPHRINE 0.3 MG/0.3ML IJ SOAJ: 0.3000 mg | Freq: Once | INTRAMUSCULAR | Status: DC | PRN

## 2019-08-19 MED ORDER — FAMOTIDINE IN NACL 20-0.9 MG/50ML-% IV SOLN: 20.0000 mg | Freq: Once | INTRAVENOUS | Status: DC | PRN

## 2019-08-19 MED ORDER — SODIUM CHLORIDE 0.9 % IV SOLN: 100.0000 mg | Freq: Once | INTRAVENOUS | Status: DC

## 2019-08-19 MED ORDER — SODIUM CHLORIDE 0.9 % IV SOLN
INTRAVENOUS | Status: AC
  Administered 2019-08-19: 18:00:00 100 mg
  Filled 2019-08-19: qty 20

## 2019-08-19 NOTE — Progress Notes (Signed)
  Diagnosis: COVID-19  Physician:  Procedure: [facility] Med: remdesivir infusion.  Complications: No immediate complications noted.  Discharge: Discharged home   Scotty Court 08/19/2019

## 2019-08-19 NOTE — Telephone Encounter (Signed)
No TCM completed, patient does not qualify for TCM services due to insurance coverage ([employer]). FYI to PCP!

## 2019-08-19 NOTE — Telephone Encounter (Signed)
Thanks--she just needs f/u appt in about 2-3 weeks

## 2019-08-20 ENCOUNTER — Ambulatory Visit (HOSPITAL_COMMUNITY)
Admit: 2019-08-20 | Discharge: 2019-08-20 | Disposition: A | Discharge status: Home / Self Care | Payer: BC Managed Care – PPO | Attending: Pulmonary Disease | Admitting: Pulmonary Disease

## 2019-08-20 ENCOUNTER — Encounter: Payer: Self-pay | Admitting: Family Medicine

## 2019-08-20 ENCOUNTER — Telehealth (INDEPENDENT_AMBULATORY_CARE_PROVIDER_SITE_OTHER): Payer: BC Managed Care – PPO | Admitting: Family Medicine

## 2019-08-20 VITALS — BP 108/74 | HR 72 | Temp 98.1°F | Resp 18

## 2019-08-20 DIAGNOSIS — U071 COVID-19: Secondary | ICD-10-CM | POA: Diagnosis not present

## 2019-08-20 DIAGNOSIS — J1282 Pneumonia due to coronavirus disease 2019: Secondary | ICD-10-CM

## 2019-08-20 MED ORDER — METHYLPREDNISOLONE SODIUM SUCC 125 MG IJ SOLR: 125.0000 mg | Freq: Once | INTRAMUSCULAR | Status: DC | PRN

## 2019-08-20 MED ORDER — FAMOTIDINE IN NACL 20-0.9 MG/50ML-% IV SOLN: 20.0000 mg | Freq: Once | INTRAVENOUS | Status: DC | PRN

## 2019-08-20 MED ORDER — SODIUM CHLORIDE 0.9 % IV SOLN
INTRAVENOUS | Status: AC
  Administered 2019-08-20: 100 mg via INTRAVENOUS
  Filled 2019-08-20: qty 20

## 2019-08-20 MED ORDER — SODIUM CHLORIDE 0.9 % IV SOLN: 100.0000 mg | Freq: Once | INTRAVENOUS | Status: AC

## 2019-08-20 MED ORDER — SODIUM CHLORIDE 0.9 % IV SOLN
INTRAVENOUS | Status: AC
  Filled 2019-08-20: qty 20

## 2019-08-20 MED ORDER — ALBUTEROL SULFATE HFA 108 (90 BASE) MCG/ACT IN AERS: 2.0000 | INHALATION_SPRAY | Freq: Once | RESPIRATORY_TRACT | Status: DC | PRN

## 2019-08-20 MED ORDER — SODIUM CHLORIDE 0.9 % IV SOLN
INTRAVENOUS | Status: DC | PRN
  Administered 2019-08-20: 250 mL via INTRAVENOUS

## 2019-08-20 MED ORDER — EPINEPHRINE 0.3 MG/0.3ML IJ SOAJ: 0.3000 mg | Freq: Once | INTRAMUSCULAR | Status: DC | PRN

## 2019-08-20 MED ORDER — DIPHENHYDRAMINE HCL 50 MG/ML IJ SOLN: 50.0000 mg | Freq: Once | INTRAMUSCULAR | Status: DC | PRN

## 2019-08-20 NOTE — Telephone Encounter (Signed)
VV already scheduled for today @ 10:20 AM.

## 2019-08-20 NOTE — Discharge Instructions (Signed)
10 Things You Can Do to Manage Your COVID-19 Symptoms at Home If you have possible or confirmed COVID-19: 1. Stay home from work and school. And stay away from other public places. If you must go out, avoid using any kind of public transportation, ridesharing, or taxis. 2. Monitor your symptoms carefully. If your symptoms get worse, call your healthcare provider immediately. 3. Get rest and stay hydrated. 4. If you have a medical appointment, call the healthcare provider ahead of time and tell them that you have or may have COVID-19. 5. For medical emergencies, call 911 and notify the dispatch personnel that you have or may have COVID-19. 6. Cover your cough and sneezes with a tissue or use the inside of your elbow. 7. Wash your hands often with soap and water for at least 20 seconds or clean your hands with an alcohol-based hand sanitizer that contains at least 60% alcohol. 8. As much as possible, stay in a specific room and away from other people in your home. Also, you should use a separate bathroom, if available. If you need to be around other people in or outside of the home, wear a mask. 9. Avoid sharing personal items with other people in your household, like dishes, towels, and bedding. 10. Clean all surfaces that are touched often, like counters, tabletops, and doorknobs. Use household cleaning sprays or wipes according to the label instructions. cdc.gov/coronavirus 01/22/2019 This information is not intended to replace advice given to you by your health care provider. Make sure you discuss any questions you have with your health care provider. Document Revised: 06/26/2019 Document Reviewed: 06/26/2019 Elsevier Patient Education  2020 [employer]  

## 2019-08-20 NOTE — Progress Notes (Signed)
  Diagnosis: COVID-19  Physician: Dr. Miguel Aschoff  Procedure: [facility] Infusion Clinic Med: remdesivir infusion.  Complications: No immediate complications noted.  Discharge: Discharged home   Gabrielle Terry L 08/20/2019

## 2019-08-20 NOTE — Progress Notes (Signed)
  Diagnosis: COVID-19  Physician: Dr. Joya Gaskins  Procedure: [facility] Infusion Clinic Med: remdesivir infusion.  Complications: No immediate complications noted.  Discharge: Discharged home   Gabrielle Terry 08/20/2019

## 2019-08-20 NOTE — Progress Notes (Signed)
Patient: Gabrielle Terry Female    DOB: 02/21/1961   59 y.o.   MRN: [id number] Visit Date: 08/20/2019  Today's Provider: Wilhemena Durie, MD   No chief complaint on file.  Subjective:    Virtual Visit via Telephone Note  I connected with Gabrielle Terry on 08/20/19 at 10:20 AM EST by telephone and verified that I am speaking with the correct person using two identifiers.  Location: Patient: Home Provider: Office   I discussed the limitations, risks, security and privacy concerns of performing an evaluation and management service by telephone and the availability of in person appointments. I also discussed with the patient that there may be a patient responsible charge related to this service. The patient expressed understanding and agreed to proceed.    HPI Delightful 59 year old female was diagnosed 08/16/2019 with COVID-19 positive test (U07.1, COVID-19) with Acute Pneumonia (J12.89, Other viral pneumonia) .  She has been on outpatient infusion therapy and is slowly improve daily.  She continues to cough but her breathing has improved.  She was changed from Advair to [facility] short-term and is also on Combivent. She does have fatigue but is improving daily.  She has her last infusion therapy today of Remdesivir Checks x-ray from 08/16/2019 reveals bilateral patchy pneumonia consistent with viral pneumonia. And hypoxia. No Known Allergies   Current Outpatient Medications:  .  albuterol (PROVENTIL) (2.5 MG/3ML) 0.083% nebulizer solution, Take 3 mLs (2.5 mg total) by nebulization every 4 (four) hours as needed for wheezing or shortness of breath., Disp: 150 mL, Rfl: 12 .  amLODipine (NORVASC) 2.5 MG tablet, TAKE 1 TABLET(2.5 MG) BY MOUTH DAILY, Disp: 90 tablet, Rfl: 1 .  AUVI-Q 0.3 MG/0.3ML SOAJ injection, Use as directed for life-threatening allergic reaction., Disp: 4 Device, Rfl: 3 .  dexamethasone (DECADRON) 6 MG tablet, Take 1 tablet (6 mg total) by mouth daily., Disp: 8  tablet, Rfl: 0 .  estradiol (ESTRACE) 1 MG tablet, Take 1 mg daily by mouth., Disp: , Rfl:  .  fluticasone (FLONASE) 50 MCG/ACT nasal spray, Place 2 sprays into both nostrils daily., Disp: 16 g, Rfl: 6 .  fluticasone-salmeterol (ADVAIR HFA) 230-21 MCG/ACT inhaler, Inhale 2 puffs into the lungs 2 (two) times daily., Disp: 12 g, Rfl: 5 .  losartan-hydrochlorothiazide (HYZAAR) 100-25 MG tablet, Take 1 tablet by mouth daily., Disp: 90 tablet, Rfl: 1 .  lovastatin (MEVACOR) 20 MG tablet, TAKE 1 TABLET(20 MG) BY MOUTH DAILY (Patient taking differently: daily at 6 PM. ), Disp: 30 tablet, Rfl: 1 .  naproxen (NAPROSYN) 500 MG tablet, Take 1 tablet (500 mg total) by mouth 2 (two) times daily with a meal. (Patient taking differently: Take 500 mg by mouth 2 (two) times daily as needed (Arthritis). ), Disp: 60 tablet, Rfl: 1 .  polyethylene glycol (MIRALAX / GLYCOLAX) packet, Take 17 g by mouth daily., Disp: , Rfl:  .  PROAIR HFA 108 (90 Base) MCG/ACT inhaler, INHALE 1 TO 2 PUFFS INTO THE LUNGS EVERY 4 HOURS AS NEEDED FOR WHEEZING, Disp: 8 g, Rfl: 11  Current Facility-Administered Medications:  .  Benralizumab SOSY 30 mg, 30 mg, Subcutaneous, Q28 days, Kennith Gain, MD, 30 mg at 07/08/19 1549  Review of Systems  Constitutional: Negative for appetite change, chills, fatigue and fever.  Respiratory: Negative for chest tightness and shortness of breath.   Cardiovascular: Negative for chest pain and palpitations.  Gastrointestinal: Negative for abdominal pain, nausea and vomiting.  Neurological: Negative for dizziness  and weakness.    Social History   Tobacco Use  . Smoking status: Never Smoker  . Smokeless tobacco: Never Used  Substance Use Topics  . Alcohol use: No    Alcohol/week: 1.0 standard drinks    Types: 1 Glasses of wine per week      Objective:   There were no vitals taken for this visit. There were no vitals filed for this visit.There is no height or weight on file to  calculate BMI.   Physical Exam   No results found for any visits on 08/20/19.     Assessment & Plan     1. Pneumonia due to COVID-19 virus  Patient is continue to improve.  She plans to go back to work next Monday.  I told her it was fine if she needed to work part-time next week to get back to work as I think her energy level might be low. I like to see her in about a month to get a listen to her lungs.  She does not see her asthma/allergist physician until June  I discussed the assessment and treatment plan with the patient. The patient was provided an opportunity to ask questions and all were answered. The patient agreed with the plan and demonstrated an understanding of the instructions.   The patient was advised to call back or seek an in-person evaluation if the symptoms worsen or if the condition fails to improve as anticipated.  I provided 10 minutes of non-face-to-face time during this encounter.    Richard Cranford Mon, MD  [facility]

## 2019-08-21 ENCOUNTER — Telehealth: Payer: Self-pay

## 2019-08-21 ENCOUNTER — Encounter: Payer: Self-pay | Admitting: *Deleted

## 2019-08-21 LAB — CULTURE, BLOOD (ROUTINE X 2)
Culture: NO GROWTH
Culture: NO GROWTH
Special Requests: ADEQUATE
Special Requests: ADEQUATE

## 2019-08-21 NOTE — Telephone Encounter (Signed)
Copied from [place] [id number]. Topic: General - Inquiry >> Aug 21, 2019  9:58 AM Gabrielle Terry wrote: Reason for CRM:  Pt states at virtual [place] yesterday Dr. Rosanna Randy stated he would write pt out of work for half days next week.  Pt would like to have that note done. Pt can be reached at [phone]

## 2019-08-22 NOTE — Telephone Encounter (Signed)
Letter is ready. Patient was advised. ?

## 2019-08-25 ENCOUNTER — Other Ambulatory Visit: Payer: Self-pay

## 2019-08-25 DIAGNOSIS — G4700 Insomnia, unspecified: Secondary | ICD-10-CM

## 2019-08-25 MED ORDER — TEMAZEPAM 15 MG PO CAPS: 15.0000 mg | ORAL_CAPSULE | Freq: Every evening | ORAL | 0 refills | Status: DC | PRN

## 2019-08-25 NOTE — Telephone Encounter (Signed)
Try Temazepam 15mg  q hs prn sleep,#30,no rf.. thx

## 2019-08-25 NOTE — Addendum Note (Signed)
Addended by: Gerald Stabs on: 08/25/2019 01:57 PM   Modules accepted: Orders

## 2019-08-25 NOTE — Telephone Encounter (Signed)
Please advise 

## 2019-08-25 NOTE — Telephone Encounter (Signed)
Copied from [place] [id number]. Topic: General - Inquiry >> Aug 25, 2019  9:47 AM Mathis Bud wrote: Reason for CRM: Patient is requesting a sleeping medication.  Patient states she is having trouble sleeping. She will wake up at 2 and can't go back to sleep till 5.   Patient call back [phone]

## 2019-08-28 ENCOUNTER — Encounter: Payer: Self-pay | Admitting: Family Medicine

## 2019-08-29 ENCOUNTER — Encounter: Payer: Self-pay | Admitting: Family Medicine

## 2019-08-29 NOTE — Telephone Encounter (Signed)
Form printed

## 2019-09-02 ENCOUNTER — Other Ambulatory Visit: Payer: Self-pay

## 2019-09-02 ENCOUNTER — Ambulatory Visit (INDEPENDENT_AMBULATORY_CARE_PROVIDER_SITE_OTHER): Payer: BC Managed Care – PPO

## 2019-09-02 DIAGNOSIS — J4550 Severe persistent asthma, uncomplicated: Secondary | ICD-10-CM | POA: Diagnosis not present

## 2019-09-10 ENCOUNTER — Telehealth: Payer: Self-pay

## 2019-09-10 NOTE — Telephone Encounter (Signed)
Pt calling again asking to speak with April in regards to [facility] paperwork

## 2019-09-10 NOTE — Telephone Encounter (Signed)
Copied from [place] [id number]. Topic: General - Inquiry >> Sep 09, 2019  4:40 PM Mathis Bud wrote: Reason for CRM: Patient states she would like April to call her back regarding the [facility] paperwork. Call back [phone]

## 2019-09-10 NOTE — Telephone Encounter (Signed)
Responded to pt through e-mail.

## 2019-09-22 ENCOUNTER — Encounter: Payer: Self-pay | Admitting: Family Medicine

## 2019-09-22 ENCOUNTER — Other Ambulatory Visit: Payer: Self-pay

## 2019-09-22 ENCOUNTER — Ambulatory Visit: Payer: BC Managed Care – PPO | Admitting: Family Medicine

## 2019-09-22 VITALS — BP 115/80 | HR 76 | Temp 96.9°F | Resp 18 | Ht 65.0 in | Wt 212.0 lb

## 2019-09-22 DIAGNOSIS — J1282 Pneumonia due to coronavirus disease 2019: Secondary | ICD-10-CM

## 2019-09-22 DIAGNOSIS — J4521 Mild intermittent asthma with (acute) exacerbation: Secondary | ICD-10-CM | POA: Diagnosis not present

## 2019-09-22 DIAGNOSIS — J301 Allergic rhinitis due to pollen: Secondary | ICD-10-CM | POA: Diagnosis not present

## 2019-09-22 DIAGNOSIS — Z6837 Body mass index (BMI) 37.0-37.9, adult: Secondary | ICD-10-CM

## 2019-09-22 DIAGNOSIS — U071 COVID-19: Secondary | ICD-10-CM | POA: Diagnosis not present

## 2019-09-22 DIAGNOSIS — J9601 Acute respiratory failure with hypoxia: Secondary | ICD-10-CM

## 2019-09-22 NOTE — Progress Notes (Signed)
Patient: Gabrielle Terry Female    DOB: 01/16/1961   59 y.o.   MRN: [id number] Visit Date: 09/22/2019  Today's Provider: Wilhemena Durie, MD   Chief Complaint  Patient presents with  . Follow-up   Subjective:     HPI  Patient comes in today for Covid infection with pneumonia in mid January.  She was diagnosed with acute hypoxic respiratory failure due to Covid.  She was placed on oxygen in the hospital and weaned fairly quickly.  She was started on IV remdesivir and quickly returned back to normal.  She was also continued on steroids for a total of 10 days. Patient states she is slowly feeling back to normal.   No Known Allergies   Current Outpatient Medications:  .  albuterol (PROVENTIL) (2.5 MG/3ML) 0.083% nebulizer solution, Take 3 mLs (2.5 mg total) by nebulization every 4 (four) hours as needed for wheezing or shortness of breath., Disp: 150 mL, Rfl: 12 .  amLODipine (NORVASC) 2.5 MG tablet, TAKE 1 TABLET(2.5 MG) BY MOUTH DAILY, Disp: 90 tablet, Rfl: 1 .  AUVI-Q 0.3 MG/0.3ML SOAJ injection, Use as directed for life-threatening allergic reaction., Disp: 4 Device, Rfl: 3 .  estradiol (ESTRACE) 1 MG tablet, Take 1 mg daily by mouth., Disp: , Rfl:  .  fluticasone (FLONASE) 50 MCG/ACT nasal spray, Place 2 sprays into both nostrils daily., Disp: 16 g, Rfl: 6 .  fluticasone-salmeterol (ADVAIR HFA) 230-21 MCG/ACT inhaler, Inhale 2 puffs into the lungs 2 (two) times daily., Disp: 12 g, Rfl: 5 .  losartan-hydrochlorothiazide (HYZAAR) 100-25 MG tablet, Take 1 tablet by mouth daily., Disp: 90 tablet, Rfl: 1 .  lovastatin (MEVACOR) 20 MG tablet, TAKE 1 TABLET(20 MG) BY MOUTH DAILY (Patient taking differently: daily at 6 PM. ), Disp: 30 tablet, Rfl: 1 .  naproxen (NAPROSYN) 500 MG tablet, Take 1 tablet (500 mg total) by mouth 2 (two) times daily with a meal. (Patient taking differently: Take 500 mg by mouth 2 (two) times daily as needed (Arthritis). ), Disp: 60 tablet, Rfl: 1 .   polyethylene glycol (MIRALAX / GLYCOLAX) packet, Take 17 g by mouth daily., Disp: , Rfl:  .  PROAIR HFA 108 (90 Base) MCG/ACT inhaler, INHALE 1 TO 2 PUFFS INTO THE LUNGS EVERY 4 HOURS AS NEEDED FOR WHEEZING, Disp: 8 g, Rfl: 11 .  dexamethasone (DECADRON) 6 MG tablet, Take 1 tablet (6 mg total) by mouth daily. (Patient not taking: Reported on 08/20/2019), Disp: 8 tablet, Rfl: 0 .  temazepam (RESTORIL) 15 MG capsule, Take 1 capsule (15 mg total) by mouth at bedtime as needed for sleep., Disp: 30 capsule, Rfl: 0  Current Facility-Administered Medications:  .  Benralizumab SOSY 30 mg, 30 mg, Subcutaneous, Q28 days, Kennith Gain, MD, 30 mg at 09/02/19 1629  Review of Systems  Constitutional: Negative for appetite change, chills, fatigue and fever.  Respiratory: Positive for cough. Negative for chest tightness and shortness of breath.   Cardiovascular: Negative for chest pain and palpitations.  Gastrointestinal: Negative for abdominal pain, nausea and vomiting.  Allergic/Immunologic: Negative.   Neurological: Negative for dizziness and weakness.  Hematological: Negative.   Psychiatric/Behavioral: Negative.     Social History   Tobacco Use  . Smoking status: Never Smoker  . Smokeless tobacco: Never Used  Substance Use Topics  . Alcohol use: No    Alcohol/week: 1.0 standard drinks    Types: 1 Glasses of wine per week      Objective:  BP 115/80 (BP Location: Right Arm, Patient Position: Sitting, Cuff Size: Large)   Pulse 76   Temp (!) 96.9 F (36.1 C) (Other (Comment))   Resp 18   Ht 5\' 5"  (1.651 m)   Wt 212 lb (96.2 kg)   SpO2 98%   BMI 35.28 kg/m  Vitals:   09/22/19 1611  BP: 115/80  Pulse: 76  Resp: 18  Temp: (!) 96.9 F (36.1 C)  TempSrc: Other (Comment)  SpO2: 98%  Weight: 212 lb (96.2 kg)  Height: 5\' 5"  (1.651 m)  Body mass index is 35.28 kg/m.   Physical Exam Vitals reviewed.  Constitutional:      Appearance: She is well-developed.  HENT:      Head: Normocephalic and atraumatic.     Right Ear: External ear normal.     Left Ear: External ear normal.     Nose: Nose normal.  Eyes:     General: No scleral icterus.    Conjunctiva/sclera: Conjunctivae normal.  Neck:     Thyroid: No thyromegaly.  Cardiovascular:     Rate and Rhythm: Normal rate and regular rhythm.     Heart sounds: Normal heart sounds.  Pulmonary:     Effort: Pulmonary effort is normal.     Breath sounds: Normal breath sounds. No wheezing.  Abdominal:     Palpations: Abdomen is soft.  Skin:    General: Skin is warm and dry.  Neurological:     Mental Status: She is alert and oriented to person, place, and time.  Psychiatric:        Mood and Affect: Mood normal.        Behavior: Behavior normal.        Thought Content: Thought content normal.        Judgment: Judgment normal.      No results found for any visits on 09/22/19.     Assessment & Plan    1. Pneumonia due to COVID-19 virus Clinically almost resolved.  Certainly she is improving.  2. Acute hypoxemic respiratory failure due to severe acute respiratory syndrome coronavirus 2 (SARS-CoV-2) disease (HCC) Resolved.  3. Mild intermittent asthma with acute exacerbation Back to baseline for this patient.  4. Seasonal allergic rhinitis due to pollen Continue treatment with allergist.     Wilhemena Durie, MD  [facility]

## 2019-09-23 ENCOUNTER — Telehealth: Payer: Self-pay

## 2019-09-23 NOTE — Telephone Encounter (Signed)
Pt came by the office and dropped off FMLA that she would like Dr. Rosanna Randy to complete and fax to fax# [phone]. Pt also request to pick up copy of completed forms. Forms placed in provider's box. Please advise. Thanks TNP

## 2019-09-24 ENCOUNTER — Encounter: Payer: Self-pay | Admitting: Family Medicine

## 2019-09-24 NOTE — Telephone Encounter (Signed)
Form received

## 2019-10-05 ENCOUNTER — Other Ambulatory Visit: Payer: Self-pay | Admitting: Family Medicine

## 2019-10-05 DIAGNOSIS — I10 Essential (primary) hypertension: Secondary | ICD-10-CM

## 2019-10-16 ENCOUNTER — Ambulatory Visit (INDEPENDENT_AMBULATORY_CARE_PROVIDER_SITE_OTHER): Payer: BC Managed Care – PPO | Admitting: Family Medicine

## 2019-10-16 ENCOUNTER — Other Ambulatory Visit: Payer: Self-pay

## 2019-10-16 ENCOUNTER — Encounter (INDEPENDENT_AMBULATORY_CARE_PROVIDER_SITE_OTHER): Payer: Self-pay | Admitting: Family Medicine

## 2019-10-16 VITALS — BP 126/80 | HR 69 | Ht 65.0 in | Wt 209.0 lb

## 2019-10-16 DIAGNOSIS — R5383 Other fatigue: Secondary | ICD-10-CM | POA: Diagnosis not present

## 2019-10-16 DIAGNOSIS — I10 Essential (primary) hypertension: Secondary | ICD-10-CM | POA: Diagnosis not present

## 2019-10-16 DIAGNOSIS — Z6834 Body mass index (BMI) 34.0-34.9, adult: Secondary | ICD-10-CM

## 2019-10-16 DIAGNOSIS — E7849 Other hyperlipidemia: Secondary | ICD-10-CM | POA: Diagnosis not present

## 2019-10-16 DIAGNOSIS — Z9189 Other specified personal risk factors, not elsewhere classified: Secondary | ICD-10-CM

## 2019-10-16 DIAGNOSIS — F3289 Other specified depressive episodes: Secondary | ICD-10-CM | POA: Diagnosis not present

## 2019-10-16 DIAGNOSIS — J45909 Unspecified asthma, uncomplicated: Secondary | ICD-10-CM

## 2019-10-16 DIAGNOSIS — R0602 Shortness of breath: Secondary | ICD-10-CM

## 2019-10-16 DIAGNOSIS — Z780 Asymptomatic menopausal state: Secondary | ICD-10-CM

## 2019-10-16 DIAGNOSIS — R0683 Snoring: Secondary | ICD-10-CM

## 2019-10-16 DIAGNOSIS — Z0289 Encounter for other administrative examinations: Secondary | ICD-10-CM

## 2019-10-16 DIAGNOSIS — E669 Obesity, unspecified: Secondary | ICD-10-CM

## 2019-10-16 DIAGNOSIS — K5909 Other constipation: Secondary | ICD-10-CM

## 2019-10-16 MED ORDER — ALBUTEROL SULFATE HFA 108 (90 BASE) MCG/ACT IN AERS: 2.0000 | INHALATION_SPRAY | Freq: Four times a day (QID) | RESPIRATORY_TRACT | 3 refills | Status: DC | PRN

## 2019-10-17 LAB — COMPREHENSIVE METABOLIC PANEL
ALT: 14 IU/L (ref 0–32)
AST: 16 IU/L (ref 0–40)
Albumin/Globulin Ratio: 1.9 (ref 1.2–2.2)
Albumin: 4.3 g/dL (ref 3.8–4.9)
Alkaline Phosphatase: 57 IU/L (ref 39–117)
BUN/Creatinine Ratio: 19 (ref 9–23)
BUN: 15 mg/dL (ref 6–24)
Bilirubin Total: 0.3 mg/dL (ref 0.0–1.2)
CO2: 24 mmol/L (ref 20–29)
Calcium: 9.6 mg/dL (ref 8.7–10.2)
Chloride: 102 mmol/L (ref 96–106)
Creatinine, Ser: 0.8 mg/dL (ref 0.57–1.00)
GFR calc Af Amer: 94 mL/min/{1.73_m2} (ref >59)
GFR calc non Af Amer: 82 mL/min/{1.73_m2} (ref >59)
Globulin, Total: 2.3 g/dL (ref 1.5–4.5)
Glucose: 103 mg/dL — ABNORMAL HIGH (ref 65–99)
Potassium: 3.5 mmol/L (ref 3.5–5.2)
Sodium: 141 mmol/L (ref 134–144)
Total Protein: 6.6 g/dL (ref 6.0–8.5)

## 2019-10-17 LAB — CBC WITH DIFFERENTIAL/PLATELET
Basophils Absolute: 0 10*3/uL (ref 0.0–0.2)
Basos: 0 %
EOS (ABSOLUTE): 0 10*3/uL (ref 0.0–0.4)
Eos: 0 %
Hemoglobin: 13.7 g/dL (ref 11.1–15.9)
Immature Grans (Abs): 0 10*3/uL (ref 0.0–0.1)
Immature Granulocytes: 0 %
Lymphocytes Absolute: 1.7 10*3/uL (ref 0.7–3.1)
Lymphs: 33 %
MCH: 28.8 pg (ref 26.6–33.0)
MCHC: 33.7 g/dL (ref 31.5–35.7)
MCV: 85 fL (ref 79–97)
Monocytes Absolute: 0.3 10*3/uL (ref 0.1–0.9)
Monocytes: 5 %
Neutrophils Absolute: 3.1 10*3/uL (ref 1.4–7.0)
Neutrophils: 62 %
Platelets: 220 10*3/uL (ref 150–450)
RBC: 4.76 x10E6/uL (ref 3.77–5.28)
RDW: 14.9 % (ref 11.7–15.4)
WBC: 5 10*3/uL (ref 3.4–10.8)

## 2019-10-17 LAB — LIPID PANEL WITH LDL/HDL RATIO
Cholesterol, Total: 191 mg/dL (ref 100–199)
HDL: 50 mg/dL (ref >39)
LDL Chol Calc (NIH): 131 mg/dL — ABNORMAL HIGH (ref 0–99)
LDL/HDL Ratio: 2.6 ratio (ref 0.0–3.2)
Triglycerides: 55 mg/dL (ref 0–149)
VLDL Cholesterol Cal: 10 mg/dL (ref 5–40)

## 2019-10-17 LAB — ANEMIA PANEL
Ferritin: 241 ng/mL — ABNORMAL HIGH (ref 15–150)
Folate, Hemolysate: 405 ng/mL
Folate, RBC: 998 ng/mL (ref >498)
Hematocrit: 40.6 % (ref 34.0–46.6)
Iron Saturation: 19 % (ref 15–55)
Iron: 45 ug/dL (ref 27–159)
Retic Ct Pct: 1.7 % (ref 0.6–2.6)
Total Iron Binding Capacity: 237 ug/dL — ABNORMAL LOW (ref 250–450)
UIBC: 192 ug/dL (ref 131–425)
Vitamin B-12: 1333 pg/mL — ABNORMAL HIGH (ref 232–1245)

## 2019-10-17 LAB — HEMOGLOBIN A1C
Est. average glucose Bld gHb Est-mCnc: 123 mg/dL
Hgb A1c MFr Bld: 5.9 % — ABNORMAL HIGH (ref 4.8–5.6)

## 2019-10-17 LAB — TSH: TSH: 1.61 u[IU]/mL (ref 0.450–4.500)

## 2019-10-17 LAB — T3: T3, Total: 133 ng/dL (ref 71–180)

## 2019-10-17 LAB — VITAMIN D 25 HYDROXY (VIT D DEFICIENCY, FRACTURES): Vit D, 25-Hydroxy: 44 ng/mL (ref 30.0–100.0)

## 2019-10-17 LAB — INSULIN, RANDOM: INSULIN: 10.1 u[IU]/mL (ref 2.6–24.9)

## 2019-10-17 LAB — T4, FREE: Free T4: 1.27 ng/dL (ref 0.82–1.77)

## 2019-10-20 NOTE — Progress Notes (Signed)
Dear Dr. Garwin Brothers,   Thank you for referring Gabrielle Terry to our clinic. The following note includes my evaluation and treatment recommendations.  Chief Complaint:   OBESITY Gabrielle Terry (MR# [id number]) is a 59 y.o. female who presents for evaluation and treatment of obesity and related comorbidities. Current BMI is Body mass index is 34.78 kg/m. Gabrielle Terry has been struggling with her weight for many years and has been unsuccessful in either losing weight, maintaining weight loss, or reaching her healthy weight goal.  Gabrielle Terry is currently in the action stage of change and ready to dedicate time achieving and maintaining a healthier weight. Gabrielle Terry is interested in becoming our patient and working on intensive lifestyle modifications including (but not limited to) diet and exercise for weight loss.  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  She lives with her husband, who is diabetic.  She eats later at 9 pm.  Bed at 10 pm.  Breakfast:  Gabrielle Terry, eggs, low carb wrap Dinner:  Eats out half the time Snack:  Peanut butter, nuts, chips, candy, ice cream. Drinks:  Water, zero sugar soda, coffee with creamer.  Jalaysia's habits were reviewed today and are as follows: Her family eats meals together, she thinks her family will eat healthier with her, her desired weight loss is 30 pounds, she has been heavy most of her life, she started gaining weight after childbirth, her heaviest weight ever was 230 pounds, she skips breakfast most days, she has problems with excessive hunger, she frequently eats larger portions than normal and she struggles with emotional eating.  Depression Screen Gabrielle Terry's Food and Mood (modified PHQ-9) score was 11.  Depression screen Gabrielle Terry 2/9 10/16/2019  Decreased Interest 2  Down, Depressed, Hopeless 2  PHQ - 2 Score 4  Altered sleeping 1  Tired, decreased energy 1  Change in appetite 2  Feeling bad or failure about yourself  1  Trouble concentrating 1    Moving slowly or fidgety/restless 1  Suicidal thoughts 0  PHQ-9 Score 11  Difficult doing work/chores Somewhat difficult   Subjective:   1. Other fatigue Tyan admits to daytime somnolence and reports waking up still tired. Patent has a history of symptoms of morning fatigue and snoring. Tomas generally gets 5 or 6 hours of sleep per night, and states that she has poor quality sleep. Snoring is present. Apneic episodes are not present. Epworth Sleepiness Score is 5.  2. SOB (shortness of breath) on exertion Gabrielle Terry notes increasing shortness of breath with exercising and seems to be worsening over time with weight gain. She notes getting out of breath sooner with activity than she used to. This has gotten worse recently. Palma denies shortness of breath at rest or orthopnea.  3. Other hyperlipidemia Gabrielle Terry has hyperlipidemia and has been trying to improve her cholesterol levels with intensive lifestyle modification including a low saturated fat diet, exercise and weight loss. She denies any chest pain, claudication or myalgias.  She is taking lovastatin.  Lab Results  Component Value Date   ALT 14 10/16/2019   AST 16 10/16/2019   ALKPHOS 57 10/16/2019   BILITOT 0.3 10/16/2019   Lab Results  Component Value Date   CHOL 191 10/16/2019   HDL 50 10/16/2019   LDLCALC 131 (H) 10/16/2019   TRIG 55 10/16/2019   4. Essential hypertension Review: taking medications as instructed, no medication side effects noted, no chest pain on exertion, no dyspnea on exertion, no swelling of  ankles.  Sheryal takes amlodipine, losartan, and HCTZ for blood pressure.  BP Readings from Last 3 Encounters:  10/16/19 126/80  09/22/19 115/80  08/20/19 108/74   5. Asthma, unspecified asthma severity, unspecified whether complicated, unspecified whether persistent Gabrielle Terry has asthma and currently takes Advair.  6. Postmenopausal, on HRT Gabrielle Terry is currently postmenopausal.  7. Chronic constipation Gabrielle Terry  notes constipation.  She takes magnesium at night.  "Was told by GI to take daily for the rest of my life."  But she is not taking it.  8. Snores She had a sleep study a few years ago and was diagnosed with mild OSA.  No need for CPAP.  Situation Chance of Dozing or Sleeping  Sitting and reading 1 = slight chance of dozing or sleeping  Watching television 1 = slight chance of dozing or sleeping  Sitting inactive in a public place (theater or meeting) 0 = would never doze or sleep  Lying down in the afternoon when circumstances permit 2 = moderate chance of dozing or sleeping  Sitting and talking to someone 0 = would never doze or sleep  Sitting quietly after lunch without alcohol 1 = slight chance of dozing or sleeping  In a car, while stopped for a few minutes in traffic 0 = would never doze or sleep  TOTAL 5   9. Other depression with emotional eating  Gabrielle Terry is struggling with emotional eating and using food for comfort to the extent that it is negatively impacting her health. She has been working on behavior modification techniques to help reduce her emotional eating and has been unsuccessful. She shows no sign of suicidal or homicidal ideations.  PHQ-9 is 11.  10. At risk for heart disease Gabrielle Terry is at a higher than average risk for cardiovascular disease due to obesity.   Assessment/Plan:   1. Other fatigue Gabrielle Terry does feel that her weight is causing her energy to be lower than it should be. Fatigue may be related to obesity, depression or many other causes. Labs will be ordered, and in the meanwhile, Gabrielle Terry will focus on self care including making healthy food choices, increasing physical activity and focusing on stress reduction.  Orders - EKG 12-Lead - Comprehensive metabolic panel - CBC with Differential/Platelet - Hemoglobin A1c - Insulin, random - VITAMIN D 25 Hydroxy (Vit-D Deficiency, Fractures) - T3 - T4, free - TSH - Anemia panel  2. SOB (shortness of breath) on  exertion Caty does feel that she gets out of breath more easily that she used to when she exercises. Gabrielle Terry's shortness of breath appears to be obesity related and exercise induced. She has agreed to work on weight loss and gradually increase exercise to treat her exercise induced shortness of breath. Will continue to monitor closely.  3. Other hyperlipidemia Cardiovascular risk and specific lipid/LDL goals reviewed.  We discussed several lifestyle modifications today and Gabrielle Terry will continue to work on diet, exercise and weight loss efforts. Orders and follow up as documented in patient record.   Counseling Intensive lifestyle modifications are the first line treatment for this issue. . Dietary changes: Increase soluble fiber. Decrease simple carbohydrates. . Exercise changes: Moderate to vigorous-intensity aerobic activity 150 minutes per week if tolerated. . Lipid-lowering medications: see documented in medical record.  Orders - Lipid Panel With LDL/HDL Ratio  4. Essential hypertension Gabrielle Terry is working on healthy weight loss and exercise to improve blood pressure control. We will watch for signs of hypotension as she continues her lifestyle modifications.  5.  Asthma, unspecified asthma severity, unspecified whether complicated, unspecified whether persistent Will send in albuterol HFA 1-2 puffs every 4-6 hours as needed for shortness of breath or wheeze.  6. Postmenopausal Will continue to monitor.  7. Chronic constipation Counseling: Intensive lifestyle modifications are the first line treatment for this issue. We discussed several lifestyle modifications today and she will continue to work on diet, exercise and weight loss efforts. We will continue to monitor.  8. Snores Current treatment plan is effective, no change in therapy. Orders and follow up as documented in patient record.  9. Other depression with emotional eating  Behavior modification techniques were discussed today  to help Gabrielle Terry deal with her emotional/non-hunger eating behaviors.  Orders and follow up as documented in patient record.   10. At risk for heart disease Gabrielle Terry was given approximately 15 minutes of coronary artery disease prevention counseling today. She is 59 y.o. female and has risk factors for heart disease including obesity. We discussed intensive lifestyle modifications today with an emphasis on specific weight loss instructions and strategies.   11. Class 1 obesity with serious comorbidity and body mass index (BMI) of 34.0 to 34.9 in adult, unspecified obesity type Gabrielle Terry is currently in the action stage of change and her goal is to continue with weight loss efforts. I recommend Gabrielle Terry begin the structured treatment plan as follows:  She has agreed to the Category 1 Plan.  Exercise goals: As is.  Walks 10 minutes 4 times a week.  Averages 9,000 steps a day.   Behavioral modification strategies: increasing lean protein intake, decreasing simple carbohydrates, increasing vegetables and increasing water intake.  She was informed of the importance of frequent follow-up visits to maximize her success with intensive lifestyle modifications for her multiple health conditions. She was informed we would discuss her lab results at her next visit unless there is a critical issue that needs to be addressed sooner. Aniyia agreed to keep her next visit at the agreed upon time to discuss these results.  Objective:   Blood pressure 126/80, pulse 69, height 5\' 5"  (1.651 m), weight 209 lb (94.8 kg), SpO2 99 %. Body mass index is 34.78 kg/m.  EKG: Normal sinus rhythm, rate 72 bpm.  Indirect Calorimeter completed today shows a VO2 of 197 and a REE of 1376.  Her calculated basal metabolic rate is 0000000 thus her basal metabolic rate is worse than expected.  General: Cooperative, alert, well developed, in no acute distress. HEENT: Conjunctivae and lids unremarkable. Cardiovascular: Regular rhythm.    Lungs: Normal work of breathing. Neurologic: No focal deficits.   Lab Results  Component Value Date   CREATININE 0.80 10/16/2019   BUN 15 10/16/2019   NA 141 10/16/2019   K 3.5 10/16/2019   CL 102 10/16/2019   CO2 24 10/16/2019   Lab Results  Component Value Date   ALT 14 10/16/2019   AST 16 10/16/2019   ALKPHOS 57 10/16/2019   BILITOT 0.3 10/16/2019   Lab Results  Component Value Date   HGBA1C 5.9 (H) 10/16/2019   Lab Results  Component Value Date   INSULIN 10.1 10/16/2019   Lab Results  Component Value Date   TSH 1.610 10/16/2019   Lab Results  Component Value Date   CHOL 191 10/16/2019   HDL 50 10/16/2019   LDLCALC 131 (H) 10/16/2019   TRIG 55 10/16/2019   Lab Results  Component Value Date   WBC 5.0 10/16/2019   HGB 13.7 10/16/2019   HCT 40.6 10/16/2019  MCV 85 10/16/2019   PLT 220 10/16/2019   Lab Results  Component Value Date   IRON 45 10/16/2019   TIBC 237 (L) 10/16/2019   FERRITIN 241 (H) 10/16/2019   Attestation Statements:   This is the patient's first visit at [facility]. The patient's NEW PATIENT PACKET was reviewed at length. Included in the packet: current and past health history, medications, allergies, ROS, gynecologic history (women only), surgical history, family history, social history, weight history, weight loss surgery history (for those that have had weight loss surgery), nutritional evaluation, mood and food questionnaire, PHQ9, Epworth questionnaire, sleep habits questionnaire, patient life and health improvement goals questionnaire. These will all be scanned into the patient's chart under media.   During the visit, I independently reviewed the patient's EKG, bioimpedance scale results, and indirect calorimeter results. I used this information to tailor a meal plan for the patient that will help her to lose weight and will improve her obesity-related conditions going forward. I performed a medically necessary  appropriate examination and/or evaluation. I discussed the assessment and treatment plan with the patient. The patient was provided an opportunity to ask questions and all were answered. The patient agreed with the plan and demonstrated an understanding of the instructions. Labs were ordered at this visit and will be reviewed at the next visit unless more critical results need to be addressed immediately. Clinical information was updated and documented in the EMR.   I, Water quality scientist, CMA, am acting as Location manager for [employer], DO.  I have reviewed the above documentation for accuracy and completeness, and I agree with the above. Briscoe Deutscher, DO

## 2019-10-28 ENCOUNTER — Other Ambulatory Visit: Payer: Self-pay

## 2019-10-28 ENCOUNTER — Other Ambulatory Visit: Payer: Self-pay | Admitting: Allergy and Immunology

## 2019-10-28 ENCOUNTER — Ambulatory Visit (INDEPENDENT_AMBULATORY_CARE_PROVIDER_SITE_OTHER): Payer: BC Managed Care – PPO

## 2019-10-28 DIAGNOSIS — J4550 Severe persistent asthma, uncomplicated: Secondary | ICD-10-CM

## 2019-10-28 MED ORDER — EPINEPHRINE 0.3 MG/0.3ML IJ SOAJ: INTRAMUSCULAR | 2 refills | Status: DC

## 2019-10-28 NOTE — Addendum Note (Signed)
Addended by: Lucrezia Starch I on: 10/28/2019 06:50 PM   Modules accepted: Orders

## 2019-10-28 NOTE — Telephone Encounter (Signed)
Refill has been sent in as requested.

## 2019-10-28 NOTE — Telephone Encounter (Signed)
Patient came into office and states [facility] was sending over a refill request, but has not heard anything. Patient needs refill as soon as possible.  Please advise.

## 2019-10-30 ENCOUNTER — Encounter (INDEPENDENT_AMBULATORY_CARE_PROVIDER_SITE_OTHER): Payer: Self-pay | Admitting: Family Medicine

## 2019-10-30 ENCOUNTER — Other Ambulatory Visit: Payer: Self-pay

## 2019-10-30 ENCOUNTER — Ambulatory Visit (INDEPENDENT_AMBULATORY_CARE_PROVIDER_SITE_OTHER): Payer: BC Managed Care – PPO | Admitting: Family Medicine

## 2019-10-30 VITALS — BP 116/72 | HR 73 | Temp 98.5°F | Ht 65.0 in | Wt 208.0 lb

## 2019-10-30 DIAGNOSIS — E7849 Other hyperlipidemia: Secondary | ICD-10-CM

## 2019-10-30 DIAGNOSIS — R7303 Prediabetes: Secondary | ICD-10-CM | POA: Diagnosis not present

## 2019-10-30 DIAGNOSIS — Z6834 Body mass index (BMI) 34.0-34.9, adult: Secondary | ICD-10-CM

## 2019-10-30 DIAGNOSIS — N951 Menopausal and female climacteric states: Secondary | ICD-10-CM | POA: Diagnosis not present

## 2019-10-30 DIAGNOSIS — I10 Essential (primary) hypertension: Secondary | ICD-10-CM

## 2019-10-30 DIAGNOSIS — E669 Obesity, unspecified: Secondary | ICD-10-CM

## 2019-10-30 DIAGNOSIS — Z9189 Other specified personal risk factors, not elsewhere classified: Secondary | ICD-10-CM | POA: Diagnosis not present

## 2019-11-01 MED ORDER — METFORMIN HCL 500 MG PO TABS: 500.0000 mg | ORAL_TABLET | Freq: Every day | ORAL | 0 refills | Status: DC

## 2019-11-03 ENCOUNTER — Encounter: Payer: Self-pay | Admitting: Family Medicine

## 2019-11-03 ENCOUNTER — Telehealth: Payer: Self-pay

## 2019-11-03 NOTE — Telephone Encounter (Signed)
Copied from [place] [id number]. Topic: General - Other >> Nov 03, 2019  4:23 PM Leward Quan A wrote: Reason for CRM: Patient called to inquire about paper work that was completed on 10/02/19 was available to be sent to her via My Chart so that she can bring it to her employer so she will be paid. Any questions Please call patient at Ph#  [phone] or [phone]

## 2019-11-03 NOTE — Progress Notes (Signed)
Chief Complaint:   OBESITY Gabrielle Terry is here to discuss her progress with her obesity treatment plan along with follow-up of her obesity related diagnoses. Gabrielle Terry is on the Category 1 Plan and states she is following her eating plan approximately 80% of the time. Gabrielle Terry states she is walking for 60 minutes 3-4 times per week.  Today's visit was #: 2 Starting weight: 209 lbs  Starting date: 209 lbs Today's weight: 208 lbs Today's date: 10/30/2019 Total lbs lost to date: 1 lb Total lbs lost since last in-office visit: 1 lb  Interim History: Gabrielle Terry provided the following food recall today:  Breakfast:  2 egg omelet, toast, coffee Lunch:  Frozen meals or leftovers or salad or cheese/meat Dinner:  Salad, 6 ounces of protein. Snack:  Yogurt, cheese She is up 4 pounds of water.  Subjective:   1. Prediabetes Gabrielle Terry has a diagnosis of prediabetes based on her elevated HgA1c and was informed this puts her at greater risk of developing diabetes. She continues to work on diet and exercise to decrease her risk of diabetes. She denies nausea or hypoglycemia.  Gabrielle Terry endorses polyphagia.  Lab Results  Component Value Date   HGBA1C 5.9 (H) 10/16/2019   Lab Results  Component Value Date   INSULIN 10.1 10/16/2019   2. Other hyperlipidemia Gabrielle Terry has hyperlipidemia and has been trying to improve her cholesterol levels with intensive lifestyle modification including a low saturated fat diet, exercise and weight loss. She denies any chest pain, claudication or myalgias.  Lab Results  Component Value Date   ALT 14 10/16/2019   AST 16 10/16/2019   ALKPHOS 57 10/16/2019   BILITOT 0.3 10/16/2019   Lab Results  Component Value Date   CHOL 191 10/16/2019   HDL 50 10/16/2019   LDLCALC 131 (H) 10/16/2019   TRIG 55 10/16/2019   3. Essential hypertension Review: taking medications as instructed, no medication side effects noted, no chest pain on exertion, no dyspnea on exertion, no swelling  of ankles.   BP Readings from Last 3 Encounters:  10/30/19 116/72  10/16/19 126/80  09/22/19 115/80   4. Vasomotor symptoms due to menopause Mckenzy states she has been having some vasomotor symptoms.  5. At risk for diarrhea Sharlotte is at higher risk of diarrhea due to medications and diet.  Assessment/Plan:   1. Prediabetes [name] will continue to work on weight loss, exercise, and decreasing simple carbohydrates to help decrease the risk of diabetes.   Orders - metFORMIN (GLUCOPHAGE) 500 MG tablet; Take 1 tablet (500 mg total) by mouth daily.  Dispense: 30 tablet; Refill: 0  2. Other hyperlipidemia Cardiovascular risk and specific lipid/LDL goals reviewed.  We discussed several lifestyle modifications today and [name] will continue to work on diet, exercise and weight loss efforts. Orders and follow up as documented in patient record.   Counseling Intensive lifestyle modifications are the first line treatment for this issue. . Dietary changes: Increase soluble fiber. Decrease simple carbohydrates. . Exercise changes: Moderate to vigorous-intensity aerobic activity 150 minutes per week if tolerated. . Lipid-lowering medications: see documented in medical record.  3. Essential hypertension Gabrielle Terry is working on healthy weight loss and exercise to improve blood pressure control. We will watch for signs of hypotension as she continues her lifestyle modifications.  4. Vasomotor symptoms due to menopause We will continue to monitor. Orders and follow up as documented in patient record.  5. At risk for diarrhea Gabrielle Terry was given approximately 15 minutes of diarrhea prevention  counseling today. She is 59 y.o. female and has risk factors for diarrhea including medications and changes in diet. We discussed intensive lifestyle modifications today with an emphasis on specific weight loss instructions including dietary strategies.   Repetitive spaced learning was employed today to elicit  superior memory formation and behavioral change.  6. Class 1 obesity with serious comorbidity and body mass index (BMI) of 34.0 to 34.9 in adult, unspecified obesity type Gabrielle Terry is currently in the action stage of change. As such, her goal is to continue with weight loss efforts. She has agreed to the Category 1 Plan.   Exercise goals: As is.  Behavioral modification strategies: increasing lean protein intake.  Gabrielle Terry has agreed to follow-up with our clinic in 1 weeks. She was informed of the importance of frequent follow-up visits to maximize her success with intensive lifestyle modifications for her multiple health conditions.   Objective:   Blood pressure 116/72, pulse 73, temperature 98.5 F (36.9 C), temperature source Oral, height 5\' 5"  (1.651 m), weight 208 lb (94.3 kg), SpO2 97 %. Body mass index is 34.61 kg/m.  General: Cooperative, alert, well developed, in no acute distress. HEENT: Conjunctivae and lids unremarkable. Cardiovascular: Regular rhythm.  Lungs: Normal work of breathing. Neurologic: No focal deficits.   Lab Results  Component Value Date   CREATININE 0.80 10/16/2019   BUN 15 10/16/2019   NA 141 10/16/2019   K 3.5 10/16/2019   CL 102 10/16/2019   CO2 24 10/16/2019   Lab Results  Component Value Date   ALT 14 10/16/2019   AST 16 10/16/2019   ALKPHOS 57 10/16/2019   BILITOT 0.3 10/16/2019   Lab Results  Component Value Date   HGBA1C 5.9 (H) 10/16/2019   Lab Results  Component Value Date   INSULIN 10.1 10/16/2019   Lab Results  Component Value Date   TSH 1.610 10/16/2019   Lab Results  Component Value Date   CHOL 191 10/16/2019   HDL 50 10/16/2019   LDLCALC 131 (H) 10/16/2019   TRIG 55 10/16/2019   Lab Results  Component Value Date   WBC 5.0 10/16/2019   HGB 13.7 10/16/2019   HCT 40.6 10/16/2019   MCV 85 10/16/2019   PLT 220 10/16/2019   Lab Results  Component Value Date   IRON 45 10/16/2019   TIBC 237 (L) 10/16/2019   FERRITIN  241 (H) 10/16/2019   Attestation Statements:   Reviewed by clinician on day of visit: allergies, medications, problem list, medical history, surgical history, family history, social history, and previous encounter notes.  I, Water quality scientist, CMA, am acting as Location manager for [employer], DO.  I have reviewed the above documentation for accuracy and completeness, and I agree with the above. Briscoe Deutscher, DO

## 2019-11-04 NOTE — Telephone Encounter (Signed)
Dr. Rosanna Randy do you have this form? Or was it put on CMA desk? KW

## 2019-11-04 NOTE — Telephone Encounter (Signed)
I have not seen anything recently.  Finished all paperwork before I left on my trip April 1

## 2019-11-05 NOTE — Telephone Encounter (Signed)
Thanks Michelle

## 2019-11-10 NOTE — Telephone Encounter (Signed)
Done waiting on form to be scanned back into chart.

## 2019-11-12 ENCOUNTER — Telehealth: Payer: Self-pay

## 2019-11-12 NOTE — Telephone Encounter (Signed)
Patient will come by office and pick up copy of form.

## 2019-11-12 NOTE — Telephone Encounter (Signed)
Pt called in for an update on the paperwork. Pt would like to know if the paperwork was faxed or if she needs to pick it up and fax it herself. Pt requests call back. Cb# [phone]

## 2019-11-12 NOTE — Telephone Encounter (Signed)
LMOVM for pt to return call 

## 2019-11-12 NOTE — Telephone Encounter (Signed)
Copied from [place] [id number]. Topic: General - Other >> Nov 12, 2019 11:07 AM Virl Axe D wrote: Reason for CRM: Pt stated she was returning a vm from [place]. No answer on FC line x3. Pt stated the correct fax number is [phone] for [place] short term disabillity

## 2019-11-18 ENCOUNTER — Telehealth: Payer: Self-pay | Admitting: *Deleted

## 2019-11-18 NOTE — Telephone Encounter (Signed)
L/m for patient to contact clinic to make appt for reapproval for Fasenra pen

## 2019-11-19 ENCOUNTER — Encounter: Payer: Self-pay | Admitting: Family Medicine

## 2019-11-19 NOTE — Telephone Encounter (Signed)
Returned call to patient. Scheduled ov for next week to go over form.

## 2019-11-19 NOTE — Telephone Encounter (Signed)
Relation to pt: self  Call back number: [phone]    Reason for call:  [facility] [employer] Short Term Disability is requesting additional information. Patient received a letter requesting the following information  Proof of loss, the start date of disability, cause of disability,  Disability dx  and any supporting office notes. Patient will attempt to upload [place] letter to my chart. Patient requesting a follow up call when received .

## 2019-11-20 NOTE — Telephone Encounter (Signed)
Patient made an appointment with Webb Silversmith on 12/12/2019 at 3:00pm.

## 2019-11-24 ENCOUNTER — Ambulatory Visit (INDEPENDENT_AMBULATORY_CARE_PROVIDER_SITE_OTHER): Payer: BC Managed Care – PPO | Admitting: Family Medicine

## 2019-11-24 ENCOUNTER — Encounter (INDEPENDENT_AMBULATORY_CARE_PROVIDER_SITE_OTHER): Payer: Self-pay | Admitting: Family Medicine

## 2019-11-24 ENCOUNTER — Other Ambulatory Visit: Payer: Self-pay

## 2019-11-24 VITALS — BP 105/71 | HR 67 | Temp 98.4°F | Ht 65.0 in | Wt 209.0 lb

## 2019-11-24 DIAGNOSIS — R7303 Prediabetes: Secondary | ICD-10-CM

## 2019-11-24 DIAGNOSIS — E7849 Other hyperlipidemia: Secondary | ICD-10-CM | POA: Diagnosis not present

## 2019-11-24 DIAGNOSIS — R632 Polyphagia: Secondary | ICD-10-CM

## 2019-11-24 DIAGNOSIS — R1013 Epigastric pain: Secondary | ICD-10-CM | POA: Diagnosis not present

## 2019-11-24 DIAGNOSIS — E669 Obesity, unspecified: Secondary | ICD-10-CM

## 2019-11-24 DIAGNOSIS — Z9189 Other specified personal risk factors, not elsewhere classified: Secondary | ICD-10-CM | POA: Diagnosis not present

## 2019-11-24 DIAGNOSIS — Z6834 Body mass index (BMI) 34.0-34.9, adult: Secondary | ICD-10-CM

## 2019-11-24 MED ORDER — PHENTERMINE HCL 37.5 MG PO TABS: 18.7500 mg | ORAL_TABLET | Freq: Every day | ORAL | 0 refills | Status: DC

## 2019-11-24 NOTE — Progress Notes (Signed)
Chief Complaint:   OBESITY Gabrielle Terry is here to discuss her progress with her obesity treatment plan along with follow-up of her obesity related diagnoses. Gabrielle Terry is on the Category 1 Plan and states she is following her eating plan approximately 20% of the time. [name] states she is walking and working with a trainer for 60 minutes 5 times per week.  Today's visit was #: 3 Starting weight: 209 lbs Starting date: 10/16/2019 Today's weight: 209 lbs Today's date: 11/24/2019 Total lbs lost to date: 0 Total lbs lost since last in-office visit: 0  Interim History: Gabrielle Terry started working with Doctor, general practice (Physiological scientist) at the [employer] today.  She said she has been eating late and then going to bed.  She provided the following food recall today: Breakfast:  2 boiled eggs, [place] sausage, toast. Lunch:  Tur[place]/cheese sandwich. Drink:  Sprite Zero.  Subjective:   1. Other hyperlipidemia Gabrielle Terry has hyperlipidemia and has been trying to improve her cholesterol levels with intensive lifestyle modification including a low saturated fat diet, exercise and weight loss. She denies any chest pain, claudication or myalgias.  Lab Results  Component Value Date   ALT 14 10/16/2019   AST 16 10/16/2019   ALKPHOS 57 10/16/2019   BILITOT 0.3 10/16/2019   Lab Results  Component Value Date   CHOL 191 10/16/2019   HDL 50 10/16/2019   LDLCALC 131 (H) 10/16/2019   TRIG 55 10/16/2019   2. Prediabetes Gabrielle Terry has a diagnosis of prediabetes based on her elevated HgA1c and was informed this puts her at greater risk of developing diabetes. She continues to work on diet and exercise to decrease her risk of diabetes. She denies nausea or hypoglycemia.  Lab Results  Component Value Date   HGBA1C 5.9 (H) 10/16/2019   Lab Results  Component Value Date   INSULIN 10.1 10/16/2019   3. Dyspepsia She has been experiencing acid reflux.   Assessment/Plan:   1. Other hyperlipidemia Cardiovascular risk  and specific lipid/LDL goals reviewed.  We discussed several lifestyle modifications today and [name] will continue to work on diet, exercise and weight loss efforts. Orders and follow up as documented in patient record.   Counseling Intensive lifestyle modifications are the first line treatment for this issue. . Dietary changes: Increase soluble fiber. Decrease simple carbohydrates. . Exercise changes: Moderate to vigorous-intensity aerobic activity 150 minutes per week if tolerated. . Lipid-lowering medications: see documented in medical record.  2. Prediabetes Gabrielle Terry will continue to work on weight loss, exercise, and decreasing simple carbohydrates to help decrease the risk of diabetes.   3. Dyspepsia Recommend OTC Nexium and Pepcid. Intensive lifestyle modifications are the first line treatment for this issue. We discussed several lifestyle modifications today and she will continue to work on diet, exercise and weight loss efforts. Orders and follow up as documented in patient record.   Counseling . If a person has gastroesophageal reflux disease (GERD), food and stomach acid move back up into the esophagus and cause symptoms or problems such as damage to the esophagus. . Anti-reflux measures include: raising the head of the bed, avoiding tight clothing or belts, avoiding eating late at night, not lying down shortly after mealtime, and achieving weight loss. . Avoid ASA, NSAID's, caffeine, alcohol, and tobacco.  . OTC Pepcid and/or Tums are often very helpful for as needed use.  Gabrielle Terry However, for persisting chronic or daily symptoms, stronger medications like Omeprazole may be needed. . You may need to avoid foods and  drinks such as: ? Coffee and tea (with or without caffeine). ? Drinks that contain alcohol. ? Energy drinks and sports drinks. ? Bubbly (carbonated) drinks or sodas. ? Chocolate and cocoa. ? Peppermint and mint flavorings. ? Garlic and onions. ? Horseradish. ? Spicy and  acidic foods. These include peppers, chili powder, curry powder, vinegar, hot sauces, and BBQ sauce. ? Citrus fruit juices and citrus fruits, such as oranges, lemons, and limes. ? Tomato-based foods. These include red sauce, chili, salsa, and pizza with red sauce. ? Fried and fatty foods. These include donuts, french fries, potato chips, and high-fat dressings. ? High-fat meats. These include hot dogs, rib eye steak, sausage, ham, and bacon.  4. Polyphagia Intensive lifestyle modifications are the first line treatment for this issue. We discussed several lifestyle modifications today and she will continue to work on diet, exercise and weight loss efforts. After discussion, patient would like to start below medication. Expectations, risks, and potential side effects reviewed. Orders and follow up as documented in patient record.  Counseling . Polyphagia is excessive hunger. . Causes can include: low blood sugars, hypERthyroidism, PMS, lack of sleep, stress, insulin resistance, diabetes, certain medications, and diets that are deficient in protein and fiber.   Orders - phentermine (ADIPEX-P) 37.5 MG tablet; Take 0.5 tablets (18.75 mg total) by mouth daily before breakfast.  Dispense: 30 tablet; Refill: 0  5. At risk for constipation Gabrielle Terry was given approximately 15 minutes of counseling today regarding prevention of constipation. She was encouraged to increase water and fiber intake.   6. Class 1 obesity with serious comorbidity and body mass index (BMI) of 34.0 to 34.9 in adult, unspecified obesity type Gabrielle Terry is currently in the action stage of change. As such, her goal is to continue with weight loss efforts. She has agreed to the Category 1 Plan.   Exercise goals: For substantial health benefits, adults should do at least 150 minutes (2 hours and 30 minutes) a week of moderate-intensity, or 75 minutes (1 hour and 15 minutes) a week of vigorous-intensity aerobic physical activity, or an  equivalent combination of moderate- and vigorous-intensity aerobic activity. Aerobic activity should be performed in episodes of at least 10 minutes, and preferably, it should be spread throughout the week.  Behavioral modification strategies: increasing lean protein intake.  Gabrielle Terry has agreed to follow-up with our clinic in 2 weeks. She was informed of the importance of frequent follow-up visits to maximize her success with intensive lifestyle modifications for her multiple health conditions.   Objective:   Blood pressure 105/71, pulse 67, temperature 98.4 F (36.9 C), temperature source Oral, height 5\' 5"  (1.651 m), weight 209 lb (94.8 kg), SpO2 98 %. Body mass index is 34.78 kg/m.  General: Cooperative, alert, well developed, in no acute distress. HEENT: Conjunctivae and lids unremarkable. Cardiovascular: Regular rhythm.  Lungs: Normal work of breathing. Neurologic: No focal deficits.   Lab Results  Component Value Date   CREATININE 0.80 10/16/2019   BUN 15 10/16/2019   NA 141 10/16/2019   K 3.5 10/16/2019   CL 102 10/16/2019   CO2 24 10/16/2019   Lab Results  Component Value Date   ALT 14 10/16/2019   AST 16 10/16/2019   ALKPHOS 57 10/16/2019   BILITOT 0.3 10/16/2019   Lab Results  Component Value Date   HGBA1C 5.9 (H) 10/16/2019   Lab Results  Component Value Date   INSULIN 10.1 10/16/2019   Lab Results  Component Value Date   TSH 1.610 10/16/2019  Lab Results  Component Value Date   CHOL 191 10/16/2019   HDL 50 10/16/2019   LDLCALC 131 (H) 10/16/2019   TRIG 55 10/16/2019   Lab Results  Component Value Date   WBC 5.0 10/16/2019   HGB 13.7 10/16/2019   HCT 40.6 10/16/2019   MCV 85 10/16/2019   PLT 220 10/16/2019   Lab Results  Component Value Date   IRON 45 10/16/2019   TIBC 237 (L) 10/16/2019   FERRITIN 241 (H) 10/16/2019   Attestation Statements:   Reviewed by clinician on day of visit: allergies, medications, problem list, medical history,  surgical history, family history, social history, and previous encounter notes.  I, Water quality scientist, CMA, am acting as Location manager for [employer], DO.  I have reviewed the above documentation for accuracy and completeness, and I agree with the above. Briscoe Deutscher, DO

## 2019-11-26 ENCOUNTER — Ambulatory Visit (INDEPENDENT_AMBULATORY_CARE_PROVIDER_SITE_OTHER): Payer: BC Managed Care – PPO | Admitting: Family Medicine

## 2019-11-26 ENCOUNTER — Encounter: Payer: Self-pay | Admitting: Family Medicine

## 2019-11-26 ENCOUNTER — Other Ambulatory Visit: Payer: Self-pay

## 2019-11-26 VITALS — BP 114/81 | HR 74 | Temp 96.8°F | Resp 16 | Ht 65.0 in | Wt 216.0 lb

## 2019-11-26 DIAGNOSIS — J1282 Pneumonia due to coronavirus disease 2019: Secondary | ICD-10-CM

## 2019-11-26 DIAGNOSIS — U071 COVID-19: Secondary | ICD-10-CM

## 2019-11-26 NOTE — Progress Notes (Signed)
     Established patient visit   Patient: Gabrielle Terry   DOB: 07-05-1961   59 y.o. Female  MRN: [id number] Visit Date: 11/26/2019  Today's healthcare provider: Wilhemena Durie, MD   Chief Complaint  Patient presents with  . Follow-up   Subjective    HPI Patient is here to go over FMLA form.      Medications: Outpatient Medications Prior to Visit  Medication Sig  . albuterol (VENTOLIN HFA) 108 (90 Base) MCG/ACT inhaler Inhale 2 puffs into the lungs every 6 (six) hours as needed for wheezing or shortness of breath.  Marland Kitchen amLODipine (NORVASC) 2.5 MG tablet TAKE 1 TABLET(2.5 MG) BY MOUTH DAILY  . EPINEPHrine (AUVI-Q) 0.3 mg/0.3 mL IJ SOAJ injection Use as directed for life-threatening allergic reaction.  Marland Kitchen estradiol (ESTRACE) 1 MG tablet Take 1 mg daily by mouth.  Berna Bue PEN 30 MG/ML SOAJ   . fluticasone-salmeterol (ADVAIR HFA) 230-21 MCG/ACT inhaler Inhale 2 puffs into the lungs 2 (two) times daily.  Marland Kitchen losartan-hydrochlorothiazide (HYZAAR) 100-25 MG tablet Take 1 tablet by mouth daily.  Marland Kitchen lovastatin (MEVACOR) 20 MG tablet TAKE 1 TABLET(20 MG) BY MOUTH DAILY (Patient taking differently: daily at 6 PM. )  . metFORMIN (GLUCOPHAGE) 500 MG tablet Take 1 tablet (500 mg total) by mouth daily.  . phentermine (ADIPEX-P) 37.5 MG tablet Take 0.5 tablets (18.75 mg total) by mouth daily before breakfast.  . polyethylene glycol (MIRALAX / GLYCOLAX) packet Take 17 g by mouth daily.  Marland Kitchen albuterol (PROVENTIL) (2.5 MG/3ML) 0.083% nebulizer solution Take 3 mLs (2.5 mg total) by nebulization every 4 (four) hours as needed for wheezing or shortness of breath. (Patient not taking: Reported on 11/26/2019)   Facility-Administered Medications Prior to Visit  Medication Dose Route Frequency Provider  . Benralizumab SOSY 30 mg  30 mg Subcutaneous Q28 days Kennith Gain, MD    Review of Systems  Constitutional: Negative for appetite change, chills, fatigue and fever.  Respiratory: Negative  for chest tightness and shortness of breath.   Cardiovascular: Negative for chest pain and palpitations.  Gastrointestinal: Negative for abdominal pain, nausea and vomiting.  Neurological: Negative for dizziness and weakness.       Objective    BP 114/81 (BP Location: Right Arm, Patient Position: Sitting, Cuff Size: Large)   Pulse 74   Temp (!) 96.8 F (36 C) (Other (Comment))   Resp 16   Ht 5\' 5"  (1.651 m)   Wt 216 lb (98 kg)   SpO2 97%   BMI 35.94 kg/m     Physical Exam    No results found for any visits on 11/26/19.  Assessment & Plan     1. Pneumonia due to COVID-19 virus This is now resolved and patient is doing well. FMLA papers filled out today. We have done this several times but it had to be corrected with dates    No follow-ups on file.         Richard Cranford Mon, MD  [facility] [phone] (phone) [phone] (fax)  [address]

## 2019-11-28 ENCOUNTER — Encounter: Payer: Self-pay | Admitting: Family Medicine

## 2019-12-01 ENCOUNTER — Other Ambulatory Visit: Payer: Self-pay

## 2019-12-01 ENCOUNTER — Encounter: Payer: Self-pay | Admitting: Allergy and Immunology

## 2019-12-01 ENCOUNTER — Ambulatory Visit (INDEPENDENT_AMBULATORY_CARE_PROVIDER_SITE_OTHER): Payer: BC Managed Care – PPO | Admitting: Allergy and Immunology

## 2019-12-01 VITALS — BP 112/78 | HR 76 | Temp 98.1°F | Resp 18 | Ht 65.0 in | Wt 213.0 lb

## 2019-12-01 DIAGNOSIS — J3089 Other allergic rhinitis: Secondary | ICD-10-CM

## 2019-12-01 DIAGNOSIS — J4550 Severe persistent asthma, uncomplicated: Secondary | ICD-10-CM | POA: Diagnosis not present

## 2019-12-01 DIAGNOSIS — D7219 Other eosinophilia: Secondary | ICD-10-CM

## 2019-12-01 DIAGNOSIS — T63481D Toxic effect of venom of other arthropod, accidental (unintentional), subsequent encounter: Secondary | ICD-10-CM | POA: Diagnosis not present

## 2019-12-01 DIAGNOSIS — T782XXD Anaphylactic shock, unspecified, subsequent encounter: Secondary | ICD-10-CM

## 2019-12-01 NOTE — Patient Instructions (Addendum)
  1. Continue to Treat and prevent inflammation:   A. Advair 230 - 2 inhalations 1-2 times a day depending on asthma activity  B. Benralizumab injections every 8 weeks   2. If needed:   A. ProAir HFA or similar 2 inhalations every 4-6 hours  B. nasal saline  C. OTC antihistamine  D. Epi-Pen  3. Return to clinic in 6 months or earlier if problem.

## 2019-12-01 NOTE — Progress Notes (Signed)
[place] [facility] [place] [facility] [place]   Follow-up Note  Referring Provider: Jerrol Banana.,* Primary Provider: Jerrol Banana., MD Date of Office Visit: 12/01/2019  Subjective:   Gabrielle Terry (DOB: 04-11-1961) is a 59 y.o. 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  I last saw her in this clinic on 15 July 2019.  During the interval she contracted Covid with pneumonitis at the end of January 2021 treated with remdesivir and systemic steroids with good response.  Since that infection she is really done well and has not required any additional systemic steroid or antibiotic to treat any type of airway issue.  She has been using a short acting bronchodilator prior to the performance of exercise as she now spends time with a trainer 1 time per week and walks about 3-5 times per week.  She continues on her benralizumab injections and continues on anti-inflammatory agents for airway.  She continues to carry an injectable epinephrine device for her hymenoptera venom hypersensitivity state.  She has received her first [place] Covid vaccination  Allergies as of 12/01/2019   No Known Allergies     Medication List      Advair HFA 230-21 MCG/ACT inhaler Generic drug: fluticasone-salmeterol Inhale 2 puffs into the lungs 2 (two) times daily.   albuterol 108 (90 Base) MCG/ACT inhaler Commonly known as: VENTOLIN HFA Inhale 2 puffs into the lungs every 6 (six) hours as needed for wheezing or shortness of breath.   amLODipine 2.5 MG tablet Commonly known as: NORVASC TAKE 1 TABLET(2.5 MG) BY MOUTH DAILY   EPINEPHrine 0.3 mg/0.3 mL Soaj injection Commonly known as: Auvi-Q Use as directed for life-threatening allergic  reaction.   estradiol 1 MG tablet Commonly known as: ESTRACE Take 1 mg daily by mouth.   Fasenra Pen 30 MG/ML Soaj Generic drug: Benralizumab   losartan-hydrochlorothiazide 100-25 MG tablet Commonly known as: HYZAAR Take 1 tablet by mouth daily.   lovastatin 20 MG tablet Commonly known as: MEVACOR TAKE 1 TABLET(20 MG) BY MOUTH DAILY   metFORMIN 500 MG tablet Commonly known as: GLUCOPHAGE Take 1 tablet (500 mg total) by mouth daily.   naproxen 500 MG tablet Commonly known as: NAPROSYN Take 500 mg by mouth 2 (two) times daily with a meal.   phentermine 37.5 MG tablet Commonly known as: ADIPEX-P Take 0.5 tablets (18.75 mg total) by mouth daily before breakfast.   polyethylene glycol 17 g packet Commonly known as: MIRALAX / GLYCOLAX Take 17 g by mouth daily.       Past Medical History:  Diagnosis Date  . Arthritis    knees  . Asthma 2009  . Breast hematoma 2011   Right  . Colon polyp 2013   3  . Constipation   . Cough    from sinus drainage  . COVID-19 virus infection 07/2019  . Diffuse cystic mastopathy   . Foot cramps   . GERD (gastroesophageal reflux disease)   . High cholesterol   . Hypercholesteremia   . Hypertension   . Ulcer     Past Surgical History:  Procedure Laterality Date  . ABDOMINAL HYSTERECTOMY  1997  . BREAST SURGERY Right 2011   hematoma  . [place]  . COLONOSCOPY  2013  3 polyps/ Dr Jamal Collin  . COLONOSCOPY WITH PROPOFOL N/A 03/21/2017   Procedure: COLONOSCOPY WITH PROPOFOL;  Surgeon: Christene Lye, MD;  Location: [facility] ENDOSCOPY;  Service: Endoscopy;  Laterality: N/A;  . ESOPHAGOGASTRODUODENOSCOPY (EGD) WITH PROPOFOL N/A 01/26/2016   Procedure: ESOPHAGOGASTRODUODENOSCOPY (EGD) WITH PROPOFOL;  Surgeon: Manya Silvas, MD;  Location[facility] ENDOSCOPY;  Service: Endoscopy;  Laterality: N/A;  . ETHMOIDECTOMY Bilateral 04/29/2015   Procedure: TOTAL ETHMOIDECTOMY;  Surgeon: Margaretha Sheffield, MD;  Location: [place];  Service: ENT;  Laterality: Bilateral;  . FRONTAL SINUS EXPLORATION Bilateral 04/29/2015   Procedure: FRONTAL SINUS EXPLORATION;  Surgeon: Margaretha Sheffield, MD;  Location: [place];  Service: ENT;  Laterality: Bilateral;  . IMAGE GUIDED SINUS SURGERY N/A 04/29/2015   Procedure: IMAGE GUIDED SINUS SURGERY;  Surgeon: Margaretha Sheffield, MD;  Location: [place];  Service: ENT;  Laterality: N/A;  GAVE DISK TO CE CE  . MAXILLARY ANTROSTOMY Bilateral 04/29/2015   Procedure: MAXILLARY ANTROSTOMY WITH REMOVAL OF CONTENTS;  Surgeon: Margaretha Sheffield, MD;  Location: [place];  Service: ENT;  Laterality: Bilateral;  . MYOMECTOMY  1991  . NASAL SEPTUM SURGERY  2013  . salpingo oophorectmy  1997  . SPHENOIDECTOMY Bilateral 04/29/2015   Procedure: SPHENOIDECTOMY;  Surgeon: Margaretha Sheffield, MD;  Location: [place];  Service: ENT;  Laterality: Bilateral;  . TUBAL LIGATION    . UTERINE FIBROID SURGERY     removed    Review of systems negative except as noted in HPI / PMHx or noted below:  Review of Systems  Constitutional: Negative.   HENT: Negative.   Eyes: Negative.   Respiratory: Negative.   Cardiovascular: Negative.   Gastrointestinal: Negative.   Genitourinary: Negative.   Musculoskeletal: Negative.   Skin: Negative.   Neurological: Negative.   Endo/Heme/Allergies: Negative.   Psychiatric/Behavioral: Negative.      Objective:   Vitals:   12/01/19 1650  BP: 112/78  Pulse: 76  Resp: 18  Temp: 98.1 F (36.7 C)  SpO2: 95%   Height: 5\' 5"  (165.1 cm)  Weight: 213 lb (96.6 kg)   Physical Exam Constitutional:      Appearance: She is not diaphoretic.  HENT:     Head: Normocephalic.     Right Ear: Tympanic membrane, ear canal and external ear normal.     Left Ear: Tympanic membrane, ear canal and external ear normal.     Nose: Nose normal. No mucosal edema or rhinorrhea.     Mouth/Throat:     Pharynx: Uvula midline. No oropharyngeal exudate.    Eyes:     Conjunctiva/sclera: Conjunctivae normal.  Neck:     Thyroid: No thyromegaly.     Trachea: Trachea normal. No tracheal tenderness or tracheal deviation.  Cardiovascular:     Rate and Rhythm: Normal rate and regular rhythm.     Heart sounds: Normal heart sounds, S1 normal and S2 normal. No murmur.  Pulmonary:     Effort: No respiratory distress.     Breath sounds: Normal breath sounds. No stridor. No wheezing or rales.  Lymphadenopathy:     Head:     Right side of head: No tonsillar adenopathy.     Left side of head: No tonsillar adenopathy.     Cervical: No cervical adenopathy.  Skin:    Findings: No erythema or rash.     Nails: There is no clubbing.  Neurological:     Mental Status: She is alert.     Diagnostics:  Spirometry was performed and demonstrated an FEV1 of 2.62 at 117 % of predicted.  Assessment and Plan:   1. Asthma, severe persistent, well-controlled   2. Other allergic rhinitis   3. Anaphylaxis due to hymenoptera venom, accidental or unintentional, subsequent encounter   4. Other eosinophilia     1. Continue to Treat and prevent inflammation:   A. Advair 230 - 2 inhalations 1-2 times a day depending on asthma activity  B. Benralizumab injections every 8 weeks   2. If needed:   A. ProAir HFA or similar 2 inhalations every 4-6 hours  B. nasal saline  C. OTC antihistamine  D. Epi-pen  3. Return to clinic in 6 months or earlier if problem.   Lilea appears to be doing very well at this point in time and benralizumab has really resulted in a rather dramatic improvement regarding control of her eosinophilic driven airway disease.  Assuming she does well with the plan noted above I will see her back in this clinic in 6 months or earlier if there is a problem.  Allena Katz, MD Allergy / Immunology [place]

## 2019-12-02 ENCOUNTER — Encounter: Payer: Self-pay | Admitting: Allergy and Immunology

## 2019-12-02 ENCOUNTER — Telehealth: Payer: Self-pay

## 2019-12-02 NOTE — Telephone Encounter (Signed)
Spoke to patient and let her know that forms have already been faxed and she said she called and they still have not received them.

## 2019-12-02 NOTE — Telephone Encounter (Signed)
Copied from [place] [phone]. Topic: General - Other >> Nov 12, 2019 11:07 AM Virl Axe D wrote: Reason for CRM: Pt stated she was returning a vm from [place]. No answer on FC line x3. Pt stated the correct fax number is [phone] for [place] short term disabillity >> Dec 02, 2019 10:14 AM Percell Belt A wrote: Pt called in and would like to check on the status of her short term disability, and see if this has been faxed over to [employer]?   Best number [phone]

## 2019-12-02 NOTE — Telephone Encounter (Signed)
We are refaxing form.

## 2019-12-03 ENCOUNTER — Encounter (INDEPENDENT_AMBULATORY_CARE_PROVIDER_SITE_OTHER): Payer: Self-pay | Admitting: Family Medicine

## 2019-12-04 ENCOUNTER — Other Ambulatory Visit (INDEPENDENT_AMBULATORY_CARE_PROVIDER_SITE_OTHER): Payer: Self-pay | Admitting: *Deleted

## 2019-12-04 DIAGNOSIS — R7303 Prediabetes: Secondary | ICD-10-CM

## 2019-12-04 MED ORDER — METFORMIN HCL 500 MG PO TABS: 500.0000 mg | ORAL_TABLET | Freq: Every day | ORAL | 0 refills | Status: DC

## 2019-12-10 ENCOUNTER — Telehealth: Payer: Self-pay

## 2019-12-10 ENCOUNTER — Encounter: Payer: Self-pay | Admitting: Family Medicine

## 2019-12-10 DIAGNOSIS — U071 COVID-19: Secondary | ICD-10-CM

## 2019-12-10 NOTE — Telephone Encounter (Signed)
Copied from [place] [phone]. Topic: General - Other >> Dec 10, 2019  4:09 PM Rainey Pines A wrote: Patient stated that the [employer] short term disability company faxed over in February a returning physicians form and they still have not received this information back. Please advise

## 2019-12-11 NOTE — Telephone Encounter (Signed)
Communicated with patient through e-mail. Form has been faxed to company for third time.

## 2019-12-12 ENCOUNTER — Ambulatory Visit: Payer: BC Managed Care – PPO | Admitting: Family Medicine

## 2019-12-12 NOTE — Telephone Encounter (Signed)
Forms were faxed to [phone] @ 10:55am and the fax was successful. TNP

## 2019-12-12 NOTE — Telephone Encounter (Signed)
Noted  

## 2019-12-15 ENCOUNTER — Other Ambulatory Visit: Payer: Self-pay

## 2019-12-15 ENCOUNTER — Ambulatory Visit (INDEPENDENT_AMBULATORY_CARE_PROVIDER_SITE_OTHER): Payer: BC Managed Care – PPO | Admitting: Family Medicine

## 2019-12-15 ENCOUNTER — Encounter (INDEPENDENT_AMBULATORY_CARE_PROVIDER_SITE_OTHER): Payer: Self-pay | Admitting: Family Medicine

## 2019-12-15 VITALS — BP 125/79 | HR 76 | Temp 98.5°F | Ht 65.0 in | Wt 206.0 lb

## 2019-12-15 DIAGNOSIS — E669 Obesity, unspecified: Secondary | ICD-10-CM | POA: Diagnosis not present

## 2019-12-15 DIAGNOSIS — Z9189 Other specified personal risk factors, not elsewhere classified: Secondary | ICD-10-CM | POA: Diagnosis not present

## 2019-12-15 DIAGNOSIS — R7303 Prediabetes: Secondary | ICD-10-CM | POA: Diagnosis not present

## 2019-12-15 DIAGNOSIS — Z6834 Body mass index (BMI) 34.0-34.9, adult: Secondary | ICD-10-CM

## 2019-12-15 DIAGNOSIS — R632 Polyphagia: Secondary | ICD-10-CM | POA: Diagnosis not present

## 2019-12-16 DIAGNOSIS — Z01419 Encounter for gynecological examination (general) (routine) without abnormal findings: Secondary | ICD-10-CM | POA: Diagnosis not present

## 2019-12-16 DIAGNOSIS — Z6835 Body mass index (BMI) 35.0-35.9, adult: Secondary | ICD-10-CM | POA: Diagnosis not present

## 2019-12-16 DIAGNOSIS — Z1231 Encounter for screening mammogram for malignant neoplasm of breast: Secondary | ICD-10-CM | POA: Diagnosis not present

## 2019-12-16 DIAGNOSIS — N760 Acute vaginitis: Secondary | ICD-10-CM | POA: Diagnosis not present

## 2019-12-16 MED ORDER — PHENTERMINE HCL 37.5 MG PO TABS: 18.7500 mg | ORAL_TABLET | Freq: Every day | ORAL | 0 refills | Status: DC

## 2019-12-16 NOTE — Progress Notes (Signed)
Chief Complaint:   OBESITY Gabrielle Terry is here to discuss her progress with her obesity treatment plan along with follow-up of her obesity related diagnoses. Gabrielle Terry is on the Category 1 Plan and states she is following her eating plan approximately 40% of the time. Gabrielle Terry states she is walking for 60 minutes 5 times per week and working with a trainer for 30 minutes 1 time per week.  Today's visit was #: 4 Starting weight: 209 lbs Starting date: 10/16/2019 Today's weight: 206 lbs Today's date: 12/15/2019 Total lbs lost to date: 3 lbs Total lbs lost since last in-office visit: 3 lbs  Interim History: Gabrielle Terry says she has increased her protein intake.  She is enjoying working with her trainer.  She wants to increase to 2 times a week.  Subjective:   1. Polyphagia Gabrielle Terry endorses excessive hunger.   2. Prediabetes Gabrielle Terry has a diagnosis of prediabetes based on her elevated HgA1c and was informed this puts her at greater risk of developing diabetes. She continues to work on diet and exercise to decrease her risk of diabetes. She denies nausea or hypoglycemia.  She is taking metformin 500 mg daily.  Lab Results  Component Value Date   HGBA1C 5.9 (H) 10/16/2019   Lab Results  Component Value Date   INSULIN 10.1 10/16/2019   3. At risk for constipation Gabrielle Terry is at increased risk for constipation due to inadequate water intake, changes in diet, and/or use of medications such as GLP1 agonists. Gabrielle Terry denies hard, infrequent stools currently.   Assessment/Plan:   1. Polyphagia Intensive lifestyle modifications are the first line treatment for this issue. We discussed several lifestyle modifications today and she will continue to work on diet, exercise and weight loss efforts. Orders and follow up as documented in patient record.  Counseling . Polyphagia is excessive hunger. . Causes can include: low blood sugars, hypERthyroidism, PMS, lack of sleep, stress, insulin resistance, diabetes,  certain medications, and diets that are deficient in protein and fiber.   Orders - phentermine (ADIPEX-P) 37.5 MG tablet; Take 0.5 tablets (18.75 mg total) by mouth daily before breakfast.  Dispense: 30 tablet; Refill: 0  2. Prediabetes Gabrielle Terry will continue to work on weight loss, exercise, and decreasing simple carbohydrates to help decrease the risk of diabetes.   3. At risk for constipation Gabrielle Terry was given approximately 15 minutes of counseling today regarding prevention of constipation. She was encouraged to increase water and fiber intake.   4. Class 1 obesity with serious comorbidity and body mass index (BMI) of 34.0 to 34.9 in adult, unspecified obesity type Gabrielle Terry is currently in the action stage of change. As such, her goal is to continue with weight loss efforts. She has agreed to the Category 1 Plan.   Exercise goals: For substantial health benefits, adults should do at least 150 minutes (2 hours and 30 minutes) a week of moderate-intensity, or 75 minutes (1 hour and 15 minutes) a week of vigorous-intensity aerobic physical activity, or an equivalent combination of moderate- and vigorous-intensity aerobic activity. Aerobic activity should be performed in episodes of at least 10 minutes, and preferably, it should be spread throughout the week.  Behavioral modification strategies: increasing lean protein intake and increasing water intake.  Gabrielle Terry has agreed to follow-up with our clinic in 2 weeks. She was informed of the importance of frequent follow-up visits to maximize her success with intensive lifestyle modifications for her multiple health conditions.   Objective:   Blood pressure 125/79, pulse 76, temperature 98.5 F (  36.9 C), temperature source Oral, height 5\' 5"  (1.651 m), weight 206 lb (93.4 kg), SpO2 97 %. Body mass index is 34.28 kg/m.  General: Cooperative, alert, well developed, in no acute distress. HEENT: Conjunctivae and lids unremarkable. Cardiovascular:  Regular rhythm.  Lungs: Normal work of breathing. Neurologic: No focal deficits.   Lab Results  Component Value Date   CREATININE 0.80 10/16/2019   BUN 15 10/16/2019   NA 141 10/16/2019   K 3.5 10/16/2019   CL 102 10/16/2019   CO2 24 10/16/2019   Lab Results  Component Value Date   ALT 14 10/16/2019   AST 16 10/16/2019   ALKPHOS 57 10/16/2019   BILITOT 0.3 10/16/2019   Lab Results  Component Value Date   HGBA1C 5.9 (H) 10/16/2019   Lab Results  Component Value Date   INSULIN 10.1 10/16/2019   Lab Results  Component Value Date   TSH 1.610 10/16/2019   Lab Results  Component Value Date   CHOL 191 10/16/2019   HDL 50 10/16/2019   LDLCALC 131 (H) 10/16/2019   TRIG 55 10/16/2019   Lab Results  Component Value Date   WBC 5.0 10/16/2019   HGB 13.7 10/16/2019   HCT 40.6 10/16/2019   MCV 85 10/16/2019   PLT 220 10/16/2019   Lab Results  Component Value Date   IRON 45 10/16/2019   TIBC 237 (L) 10/16/2019   FERRITIN 241 (H) 10/16/2019   Attestation Statements:   Reviewed by clinician on day of visit: allergies, medications, problem list, medical history, surgical history, family history, social history, and previous encounter notes.  I, Water quality scientist, CMA, am acting as Location manager for [employer], DO.  I have reviewed the above documentation for accuracy and completeness, and I agree with the above. Briscoe Deutscher, DO

## 2019-12-18 ENCOUNTER — Encounter: Payer: Self-pay | Admitting: Family Medicine

## 2019-12-18 ENCOUNTER — Telehealth: Payer: Self-pay

## 2019-12-18 DIAGNOSIS — I10 Essential (primary) hypertension: Secondary | ICD-10-CM

## 2019-12-18 MED ORDER — AMLODIPINE BESYLATE 2.5 MG PO TABS: ORAL_TABLET | ORAL | 1 refills | Status: DC

## 2019-12-18 NOTE — Telephone Encounter (Signed)
Refill sent to pharmacy.   

## 2019-12-23 ENCOUNTER — Ambulatory Visit (INDEPENDENT_AMBULATORY_CARE_PROVIDER_SITE_OTHER): Payer: BC Managed Care – PPO

## 2019-12-23 ENCOUNTER — Other Ambulatory Visit: Payer: Self-pay

## 2019-12-23 DIAGNOSIS — J4550 Severe persistent asthma, uncomplicated: Secondary | ICD-10-CM

## 2019-12-23 MED ORDER — BENRALIZUMAB 30 MG/ML ~~LOC~~ SOSY
30.0000 mg | PREFILLED_SYRINGE | Freq: Once | SUBCUTANEOUS | Status: AC
  Administered 2019-12-23: 30 mg via SUBCUTANEOUS

## 2019-12-25 ENCOUNTER — Other Ambulatory Visit (INDEPENDENT_AMBULATORY_CARE_PROVIDER_SITE_OTHER): Payer: Self-pay | Admitting: Family Medicine

## 2019-12-25 DIAGNOSIS — R632 Polyphagia: Secondary | ICD-10-CM

## 2019-12-29 ENCOUNTER — Encounter: Payer: Self-pay | Admitting: Family Medicine

## 2020-01-03 ENCOUNTER — Other Ambulatory Visit: Payer: Self-pay

## 2020-01-03 ENCOUNTER — Encounter (HOSPITAL_BASED_OUTPATIENT_CLINIC_OR_DEPARTMENT_OTHER): Payer: Self-pay | Admitting: Emergency Medicine

## 2020-01-03 ENCOUNTER — Emergency Department (HOSPITAL_BASED_OUTPATIENT_CLINIC_OR_DEPARTMENT_OTHER)
Admission: EM | Admit: 2020-01-03 | Discharge: 2020-01-03 | Disposition: A | Discharge status: Home / Self Care | Payer: BC Managed Care – PPO | Attending: Emergency Medicine | Admitting: Emergency Medicine

## 2020-01-03 ENCOUNTER — Emergency Department (HOSPITAL_BASED_OUTPATIENT_CLINIC_OR_DEPARTMENT_OTHER): Payer: BC Managed Care – PPO

## 2020-01-03 DIAGNOSIS — I10 Essential (primary) hypertension: Secondary | ICD-10-CM | POA: Diagnosis not present

## 2020-01-03 DIAGNOSIS — J45909 Unspecified asthma, uncomplicated: Secondary | ICD-10-CM | POA: Diagnosis not present

## 2020-01-03 DIAGNOSIS — M25561 Pain in right knee: Secondary | ICD-10-CM

## 2020-01-03 DIAGNOSIS — Z79899 Other long term (current) drug therapy: Secondary | ICD-10-CM | POA: Diagnosis not present

## 2020-01-03 MED ORDER — DICLOFENAC SODIUM 50 MG PO TBEC: 50.0000 mg | DELAYED_RELEASE_TABLET | Freq: Two times a day (BID) | ORAL | 0 refills | Status: AC

## 2020-01-03 NOTE — Discharge Instructions (Addendum)
The xray today shows arthritis in your knee.  Prescription has been sent to your pharmacy for diclofenac.  This is anti-inflammatory.  Please take as prescribed.  Take with food so it does not cause upset stomach.  Do not take any additional naproxen, Aleve, Motrin or ibuprofen as these are all similar.  Follow-up with Dr. Raeford Razor as we discussed.  Call his office and schedule a follow-up appointment  Return to the emergency department for any new or worsening symptoms.

## 2020-01-03 NOTE — ED Triage Notes (Signed)
R knee pain since Wednesday. No known injury.

## 2020-01-03 NOTE — ED Provider Notes (Signed)
[facility] EMERGENCY DEPARTMENT Provider Note   CSN: [id number] Arrival date & time: 01/03/20  1026     History Chief Complaint  Patient presents with   Knee Pain    Gabrielle Terry is a 59 y.o. female with past medical history significant for arthritis, hyperlipidemia, hypertension.  HPI She is presenting to emergency department today with chief complaint of progressively worsening right knee pain x 4 days. She denies any fall or injury. She describes the pain as an aching sensation.  She states the pain is located around her kneecap and in the back of the knee.  She states the pain is worse with movement.  She is able to walk and bear weight without difficulty.  She has tried taking naproxen and wearing a compression sleeve without symptom improvement.  She rates the pain 8 out of 10 in severity.  She states she has a history of similar pain several years ago and saw an orthopedist and was told she had arthritis behind the knee.  She denies any fever, chills, numbness, tingling, weakness, decreased sensation, lower extremity edema. No personal or family history of blood clots.    Past Medical History:  Diagnosis Date   Arthritis    knees   Asthma 2009   Breast hematoma 2011   Right   Colon polyp 2013   3   Constipation    Cough    from sinus drainage   COVID-19 virus infection 07/2019   Diffuse cystic mastopathy    Foot cramps    GERD (gastroesophageal reflux disease)    High cholesterol    Hypercholesteremia    Hypertension    Ulcer     Patient Active Problem List   Diagnosis Date Noted   Class 2 severe obesity due to excess calories with serious comorbidity and body mass index (BMI) of 37.0 to 37.9 in adult ([place]) 09/23/2019   Pneumonia due to COVID-19 virus 08/16/2019   Acute hypoxemic respiratory failure due to severe acute respiratory syndrome coronavirus 2 (SARS-CoV-2) disease ([place]) 08/16/2019   Essential hypertension    Allergic  reaction to insect sting 01/08/2018   AB (asthmatic bronchitis) 10/14/2015   Abnormal bruising 10/14/2015   Toxic effect of venom 10/14/2015   Airway hyperreactivity 10/14/2015   Acid reflux 10/14/2015   Elective abortion 10/14/2015   Hypercholesteremia 10/14/2015   Cannot sleep 10/14/2015   Hemorrhoids, internal 10/14/2015   Climacteric 10/14/2015   Localized superficial swelling, mass, or lump 10/14/2015   Obesity (BMI 30.0-34.9) 10/14/2015   Plantar fasciitis 10/14/2015   Allergic rhinitis 04/07/2015   Arthritis of knee, degenerative 12/18/2014   Diffuse cystic mastopathy 01/28/2014   Personal history of colonic polyps 01/09/2013   Mild intermittent asthma 2009    Past Surgical History:  Procedure Laterality Date   ABDOMINAL HYSTERECTOMY  1997   BREAST SURGERY Right 2011   hematoma   [place]  2013   3 polyps/ Dr Jamal Collin   COLONOSCOPY WITH PROPOFOL N/A 03/21/2017   Procedure: COLONOSCOPY WITH PROPOFOL;  Surgeon: Christene Lye, MD;  Location[facility] END[facility]COPY;  Service: Endoscopy;  Laterality: N/A;   ESOPHAGOGASTRODUODENOSCOPY (EGD) WITH PROPOFOL N/A 01/26/2016   Procedure: ESOPHAGOGASTRODUODENOSCOPY (EGD) WITH PROPOFOL;  Surgeon: Manya Silvas, MD;  Location: [facility] ENDOSCOPY;  Service: Endoscopy;  Laterality: N/A;   ETHMOIDECTOMY Bilateral 04/29/2015   Procedure: TOTAL ETHMOIDECTOMY;  Surgeon: Margaretha Sheffield, MD;  Location: [facility];  Service: ENT;  Laterality: Bilateral;  FRONTAL SINUS EXPLORATION Bilateral 04/29/2015   Procedure: FRONTAL SINUS EXPLORATION;  Surgeon: Margaretha Sheffield, MD;  Location: [place];  Service: ENT;  Laterality: Bilateral;   IMAGE GUIDED SINUS SURGERY N/A 04/29/2015   Procedure: IMAGE GUIDED SINUS SURGERY;  Surgeon: Margaretha Sheffield, MD;  Location: [place];  Service: ENT;  Laterality: N/A;  GAVE DISK TO CE CE   MAXILLARY ANTROSTOMY Bilateral 04/29/2015    Procedure: MAXILLARY ANTROSTOMY WITH REMOVAL OF CONTENTS;  Surgeon: Margaretha Sheffield, MD;  Location: [place];  Service: ENT;  Laterality: Bilateral;   [place]  2013   salpingo oophorectmy  1997   SPHENOIDECTOMY Bilateral 04/29/2015   Procedure: SPHENOIDECTOMY;  Surgeon: Margaretha Sheffield, MD;  Location: [place];  Service: ENT;  Laterality: Bilateral;   TUBAL LIGATION     UTERINE FIBROID SURGERY     removed     OB History    Gravida  2   Para  2   Term      Preterm      AB      Living  2     SAB      TAB      Ectopic      Multiple      Live Births           Obstetric Comments  Menstrual: 22 Age 1st Pregnancy: 20        Family History  Adopted: Yes  Problem Relation Age of Onset   Congestive Heart Failure Father    Cancer Brother    Other Brother        brain tumor   Hypertension Maternal Aunt    Thyroid disease Maternal Aunt    Diabetes Maternal Grandmother    Asthma Son    Angioedema Neg Hx    Allergic rhinitis Neg Hx    Atopy Neg Hx    Eczema Neg Hx    Immunodeficiency Neg Hx    Urticaria Neg Hx     Social History   Tobacco Use   Smoking status: Never Smoker   Smokeless tobacco: Never Used  Substance Use Topics   Alcohol use: No    Alcohol/week: 1.0 standard drink    Types: 1 Glasses of wine per week   Drug use: No    Home Medications Prior to Admission medications   Medication Sig Start Date End Date Taking? Authorizing Provider  albuterol (VENTOLIN HFA) 108 (90 Base) MCG/ACT inhaler Inhale 2 puffs into the lungs every 6 (six) hours as needed for wheezing or shortness of breath. 10/16/19   Briscoe Deutscher, DO  amLODipine (NORVASC) 2.5 MG tablet TAKE 1 TABLET(2.5 MG) BY MOUTH DAILY 12/18/19   Jerrol Banana., MD  diclofenac (VOLTAREN) 50 MG EC tablet Take 1 tablet (50 mg total) by mouth 2 (two) times daily for 7 days. 01/03/20 01/10/20  Rahmel Nedved E, PA-C    EPINEPHrine (AUVI-Q) 0.3 mg/0.3 mL IJ SOAJ injection Use as directed for life-threatening allergic reaction. 10/28/19   Valentina Shaggy, MD  estradiol (ESTRACE) 1 MG tablet Take 1 mg daily by mouth.    [provider]  FASENRA PEN 30 MG/ML SOAJ  08/27/19   [provider]  fluticasone-salmeterol (ADVAIR HFA) 230-21 MCG/ACT inhaler Inhale 2 puffs into the lungs 2 (two) times daily. 07/15/19   Kozlow, Donnamarie Poag, MD  losartan-hydrochlorothiazide (HYZAAR) 100-25 MG tablet Take 1 tablet by mouth daily. 07/28/19   Miguel Aschoff  Kaylyn Lim., MD  lovastatin (MEVACOR) 20 MG tablet TAKE 1 TABLET(20 MG) BY MOUTH DAILY Patient taking differently: daily at 6 PM.  08/11/19   Jerrol Banana., MD  metFORMIN (GLUCOPHAGE) 500 MG tablet Take 1 tablet (500 mg total) by mouth daily. 12/04/19   Briscoe Deutscher, DO  naproxen (NAPROSYN) 500 MG tablet Take 500 mg by mouth 2 (two) times daily with a meal.    [provider]  phentermine (ADIPEX-P) 37.5 MG tablet Take 0.5 tablets (18.75 mg total) by mouth daily before breakfast. 12/16/19   Briscoe Deutscher, DO  polyethylene glycol (MIRALAX / GLYCOLAX) packet Take 17 g by mouth daily.    [provider]    Allergies    Patient has no known allergies.  Review of Systems   Review of Systems  All other systems are reviewed and are negative for acute change except as noted in the HPI.   Physical Exam Updated Vital Signs BP 121/83 (BP Location: Right Arm)    Pulse 70    Temp 99 F (37.2 C) (Oral)    Resp 16    Ht 5\' 5"  (1.651 m)    Wt 94.3 kg    SpO2 98%    BMI 34.61 kg/m   Physical Exam Vitals and nursing note reviewed.  Constitutional:      Appearance: She is well-developed. She is not ill-appearing or toxic-appearing.  HENT:     Head: Normocephalic and atraumatic.     Nose: Nose normal.  Eyes:     General: No scleral icterus.       Right eye: No discharge.        Left eye: No discharge.     Conjunctiva/sclera: Conjunctivae  normal.  Neck:     Vascular: No JVD.  Cardiovascular:     Rate and Rhythm: Normal rate and regular rhythm.     Pulses:          Dorsalis pedis pulses are 2+ on the right side and 2+ on the left side.     Heart sounds: Normal heart sounds.  Pulmonary:     Effort: Pulmonary effort is normal.     Breath sounds: Normal breath sounds.  Abdominal:     General: There is no distension.  Musculoskeletal:        General: Normal range of motion.     Cervical back: Normal range of motion.     Right knee: No effusion or bony tenderness. Normal range of motion. Normal alignment, normal meniscus and normal patellar mobility.     Right lower leg: No edema.     Left lower leg: No edema.     Right ankle: Normal.     Right foot: Normal.     Comments: Homans sign absent bilaterally, no lower extremity edema, no palpable cords, compartments are soft  Ambulates with normal gait.   Negative anterior and posterior drawer test on right knee. No obvious swelling. No crepitus or deformity.  Right lower extremity is neurovascularly intact. Compartments are soft  Skin:    General: Skin is warm and dry.  Neurological:     Mental Status: She is oriented to person, place, and time.     GCS: GCS eye subscore is 4. GCS verbal subscore is 5. GCS motor subscore is 6.     Comments: Fluent speech, no facial droop.  Psychiatric:        Behavior: Behavior normal.     ED Results / Procedures / Treatments  Labs (all labs ordered are listed, but only abnormal results are displayed) Labs Reviewed - No data to display  EKG None  Radiology DG Knee Complete 4 Views Right  Result Date: 01/03/2020 CLINICAL DATA:  Pain for 4 days EXAM: RIGHT KNEE - COMPLETE 4+ VIEW COMPARISON:  None. FINDINGS: Frontal, lateral, and bilateral oblique views were obtained. There is no fracture or dislocation. No joint effusion. There is moderately severe joint space narrowing in the patellofemoral joint region. There is mild narrowing  medially. There is spurring in all compartments, most notably in the patellofemoral joint. No erosive change. 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 No evident fracture, dislocation, or joint effusion. Electronically Signed   By: Lowella Grip III M.D.   On: 01/03/2020 11:22    Procedures Procedures (including critical care time)  Medications Ordered in ED Medications - No data to display  ED Course  I have reviewed the triage vital signs and the nursing notes.  Pertinent labs & imaging results that were available during my care of the patient were reviewed by me and considered in my medical decision making (see chart for details).    MDM Rules/Calculators/A&P                          History provided by patient with additional history obtained from chart review.    Patient presents to the ED with complaints of pain to the right knee without known injury, has history of arthritis. Exam without obvious deformity or open wounds. ROM intact. No tenderness to palpation. NVI distally. She ambualtes without difficulty. No findings to suggest septic joint. Xray viewed by me shows no fracture or dislocation, does have osteoarthritis. Will prescribe diclofenac and have her follow up with sports medicine if symptoms worsen. I discussed results, treatment plan, need for follow-up, and return precautions with the patient. Provided opportunity for questions, patient confirmed understanding and are in agreement with plan.    Portions of this note were generated with [employer]. Dictation errors may occur despite best attempts at proofreading.   Final Clinical Impression(s) / ED Diagnoses Final diagnoses:  Acute pain of right knee    Rx / DC Orders ED Discharge Orders         Ordered    diclofenac (VOLTAREN) 50 MG EC tablet  2 times daily     Discontinue  Reprint     01/03/20 1136           Cherre Robins, PA-C 01/03/20  1141    Isla Pence, MD 01/03/20 1147

## 2020-01-05 ENCOUNTER — Encounter: Payer: Self-pay | Admitting: Family Medicine

## 2020-01-07 ENCOUNTER — Ambulatory Visit (INDEPENDENT_AMBULATORY_CARE_PROVIDER_SITE_OTHER): Payer: BC Managed Care – PPO | Admitting: Family Medicine

## 2020-01-07 ENCOUNTER — Other Ambulatory Visit: Payer: Self-pay

## 2020-01-07 ENCOUNTER — Encounter (INDEPENDENT_AMBULATORY_CARE_PROVIDER_SITE_OTHER): Payer: Self-pay | Admitting: Family Medicine

## 2020-01-07 VITALS — BP 122/81 | HR 71 | Temp 98.1°F | Ht 65.0 in | Wt 209.0 lb

## 2020-01-07 DIAGNOSIS — M25561 Pain in right knee: Secondary | ICD-10-CM

## 2020-01-07 DIAGNOSIS — Z9189 Other specified personal risk factors, not elsewhere classified: Secondary | ICD-10-CM | POA: Diagnosis not present

## 2020-01-07 DIAGNOSIS — E669 Obesity, unspecified: Secondary | ICD-10-CM | POA: Diagnosis not present

## 2020-01-07 DIAGNOSIS — Z6834 Body mass index (BMI) 34.0-34.9, adult: Secondary | ICD-10-CM

## 2020-01-07 DIAGNOSIS — R7303 Prediabetes: Secondary | ICD-10-CM | POA: Diagnosis not present

## 2020-01-08 ENCOUNTER — Encounter (INDEPENDENT_AMBULATORY_CARE_PROVIDER_SITE_OTHER): Payer: Self-pay | Admitting: Family Medicine

## 2020-01-08 MED ORDER — METFORMIN HCL 500 MG PO TABS: 500.0000 mg | ORAL_TABLET | Freq: Every day | ORAL | 0 refills | Status: DC

## 2020-01-12 NOTE — Progress Notes (Signed)
Chief Complaint:   OBESITY Gabrielle Terry is here to discuss her progress with her obesity treatment plan along with follow-up of her obesity related diagnoses. Gabrielle Terry is on the Category 1 Plan and states she is following her eating plan approximately 40% of the time. Gabrielle Terry states she is walking for 60 minutes 5 times per week.  Today's visit was #: 5 Starting weight: 209 lbs Starting date: 10/16/2019 Today's weight: 209 lbs Today's date: 01/07/2020 Total lbs lost to date: 0 Total lbs lost since last in-office visit: 0  Interim History: Gabrielle Terry is feeling nauseated today.  She is taking diclofenac.  She did not eat this morning.  She had toast around 11 am.  She reports that she usually eats 3 meals per day and gets her protein in.  She is up 2 pounds of water.  Subjective:   1. Prediabetes Gabrielle Terry has a diagnosis of prediabetes based on her elevated HgA1c and was informed this puts her at greater risk of developing diabetes. She continues to work on diet and exercise to decrease her risk of diabetes. She denies nausea or hypoglycemia.  She is taking metformin 500 mg daily.  Lab Results  Component Value Date   HGBA1C 5.9 (H) 10/16/2019   Lab Results  Component Value Date   INSULIN 10.1 10/16/2019   2. Right knee pain, unspecified chronicity Gabrielle Terry takes diclofenac for her knee pain.  3. At risk for deficient intake of food The patient is at a higher than average risk of deficient intake of food.  Assessment/Plan:   1. Prediabetes Gabrielle Terry will continue to work on weight loss, exercise, and decreasing simple carbohydrates to help decrease the risk of diabetes.   Orders - metFORMIN (GLUCOPHAGE) 500 MG tablet; Take 1 tablet (500 mg total) by mouth daily.  Dispense: 30 tablet; Refill: 0  2. Right knee pain Will place referral to Sports Medicine for right knee pain.  Orders - Ambulatory referral to Sports Medicine  3. At risk for deficient intake of food Gabrielle Terry was given  approximately 15 minutes of deficit intake of food prevention counseling today. Gabrielle Terry is at risk for eating too few calories based on current food recall. She was encouraged to focus on meeting caloric and protein goals according to her recommended meal plan.   4. Class 1 obesity with serious comorbidity and body mass index (BMI) of 34.0 to 34.9 in adult, unspecified obesity type Gabrielle Terry is currently in the action stage of change. As such, her goal is to continue with weight loss efforts. She has agreed to keeping a food journal and adhering to recommended goals of 1200 calories and 95+ protein.   Exercise goals: For substantial health benefits, adults should do at least 150 minutes (2 hours and 30 minutes) a week of moderate-intensity, or 75 minutes (1 hour and 15 minutes) a week of vigorous-intensity aerobic physical activity, or an equivalent combination of moderate- and vigorous-intensity aerobic activity. Aerobic activity should be performed in episodes of at least 10 minutes, and preferably, it should be spread throughout the week.  Behavioral modification strategies: increasing lean protein intake, increasing water intake and increasing high fiber foods.  Gabrielle Terry has agreed to follow-up with our clinic in 3 weeks. She was informed of the importance of frequent follow-up visits to maximize her success with intensive lifestyle modifications for her multiple health conditions.   Objective:   Blood pressure 122/81, pulse 71, temperature 98.1 F (36.7 C), temperature source Oral, height 5\' 5"  (1.651 m), weight  209 lb (94.8 kg), SpO2 97 %. Body mass index is 34.78 kg/m.  General: Cooperative, alert, well developed, in no acute distress. HEENT: Conjunctivae and lids unremarkable. Cardiovascular: Regular rhythm.  Lungs: Normal work of breathing. Neurologic: No focal deficits.   Lab Results  Component Value Date   CREATININE 0.80 10/16/2019   BUN 15 10/16/2019   NA 141 10/16/2019   K 3.5  10/16/2019   CL 102 10/16/2019   CO2 24 10/16/2019   Lab Results  Component Value Date   ALT 14 10/16/2019   AST 16 10/16/2019   ALKPHOS 57 10/16/2019   BILITOT 0.3 10/16/2019   Lab Results  Component Value Date   HGBA1C 5.9 (H) 10/16/2019   Lab Results  Component Value Date   INSULIN 10.1 10/16/2019   Lab Results  Component Value Date   TSH 1.610 10/16/2019   Lab Results  Component Value Date   CHOL 191 10/16/2019   HDL 50 10/16/2019   LDLCALC 131 (H) 10/16/2019   TRIG 55 10/16/2019   Lab Results  Component Value Date   WBC 5.0 10/16/2019   HGB 13.7 10/16/2019   HCT 40.6 10/16/2019   MCV 85 10/16/2019   PLT 220 10/16/2019   Lab Results  Component Value Date   IRON 45 10/16/2019   TIBC 237 (L) 10/16/2019   FERRITIN 241 (H) 10/16/2019   Attestation Statements:   Reviewed by clinician on day of visit: allergies, medications, problem list, medical history, surgical history, family history, social history, and previous encounter notes.  I, Water quality scientist, CMA, am acting as transcriptionist for Briscoe Deutscher, DO  I have reviewed the above documentation for accuracy and completeness, and I agree with the above. Briscoe Deutscher, DO

## 2020-01-13 ENCOUNTER — Ambulatory Visit: Payer: BC Managed Care – PPO | Admitting: Allergy and Immunology

## 2020-01-14 ENCOUNTER — Ambulatory Visit: Payer: Self-pay

## 2020-01-14 ENCOUNTER — Other Ambulatory Visit: Payer: Self-pay

## 2020-01-14 ENCOUNTER — Encounter: Payer: Self-pay | Admitting: Family Medicine

## 2020-01-14 ENCOUNTER — Ambulatory Visit: Payer: BC Managed Care – PPO | Admitting: Family Medicine

## 2020-01-14 VITALS — BP 128/88 | HR 78 | Ht 65.0 in | Wt 214.0 lb

## 2020-01-14 DIAGNOSIS — M25561 Pain in right knee: Secondary | ICD-10-CM

## 2020-01-14 MED ORDER — PREDNISONE 50 MG PO TABS
50.0000 mg | ORAL_TABLET | Freq: Every day | ORAL | 0 refills | Status: DC
Start: 2020-01-14 — End: 2020-03-01

## 2020-01-14 NOTE — Patient Instructions (Addendum)
Thank you for coming in today.  Plan to trial of custom medial offloader osteoarthritis knee brace from [employer].   Will use trial of prednisone.  Ok to reduce the dose or stop it early.  Continue the voltaren gel.   Recheck for injection if needed.   I think your knee pain is due to arthritis.  Eventually you will need a knee replacement.   Ask your trainer to work on quad strength without loading a flexed knee.  Ie straight leg raises of open chain exercises.    Recheck back with me as needed.   Keep me updated. Mychart works well.

## 2020-01-14 NOTE — Progress Notes (Signed)
Subjective:    I'm seeing this patient as a consultation for:  [name]. Note will be routed back to referring provider/PCP.  CC: R knee pain  I, Judy Pimple, am serving as a scribe for Dr. Lynne Leader.  HPI: Pt is a 59 y/o female presenting w/ c/o R knee pain x approximately 2 weeks.  She was seen at the [facility] ER on 01/03/20 acute knee pain w/ no known MOI.  She locates her pain to posterior and anterior knee.  She rates her pain at an 8/10. Patient states hurts worse at night.  Radiating pain: just around the knee R knee swelling: yes  R knee mechanical symptoms: Aggravating factors: walking or rolling around in the bed Treatments tried: naproxen; knee brace/compression sleeve; Voltaren 50mg  bid; tylenol arthritis   Diagnostic imaging: R knee XR- 01/03/20  Past medical history, Surgical history, Family history, Social history, Allergies, and medications have been entered into the medical record, reviewed.   Review of Systems: No new headache, visual changes, nausea, vomiting, diarrhea, constipation, dizziness, abdominal pain, skin rash, fevers, chills, night sweats, weight loss, swollen lymph nodes, body aches, joint swelling, muscle aches, chest pain, shortness of breath, mood changes, visual or auditory hallucinations.   Objective:    Vitals:   01/14/20 1555  BP: 128/88  Pulse: 78  SpO2: 98%   General: Well Developed, well nourished, and in no acute distress.  Neuro/Psych: Alert and oriented x3, extra-ocular muscles intact, able to move all 4 extremities, sensation grossly intact. Skin: Warm and dry, no rashes noted.  Respiratory: Not using accessory muscles, speaking in full sentences, trachea midline.  Cardiovascular: Pulses palpable, no extremity edema. Abdomen: Does not appear distended. MSK: Right knee moderate effusion. Mildly tender palpation medial joint line. Normal motion with crepitation. Stable ligamentous exam to anterior posterior  testing.   Slight laxity MCL stress test.  No pain No laxity LCL stress test. Negative Murray's test. Intact strength.  Lab and Radiology Results DG Knee Complete 4 Views Right  Result Date: 01/03/2020 CLINICAL DATA:  Pain for 4 days EXAM: RIGHT KNEE - COMPLETE 4+ VIEW COMPARISON:  None. FINDINGS: Frontal, lateral, and bilateral oblique views were obtained. There is no fracture or dislocation. No joint effusion. There is moderately severe joint space narrowing in the patellofemoral joint region. There is mild narrowing medially. There is spurring in all compartments, most notably in the patellofemoral joint. No erosive change. 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 No evident fracture, dislocation, or joint effusion. Electronically Signed   By: Lowella Grip III M.D.   On: 01/03/2020 11:22   I, Lynne Leader, personally (independently) visualized and performed the interpretation of the images attached in this note.  Diagnostic Limited MSK Ultrasound of: Right knee Quad tendon intact normal-appearing Moderate joint effusion superior patellar space. Patellar tendon normal. Lateral joint line narrowed degenerative appearing lateral meniscus. Medial joint line extremely narrowed extruded degenerative appearing medial meniscus. Posterior knee no Baker's cyst. 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   Impression and Recommendations:    Assessment and Plan: 59 y.o. female with right knee pain ongoing for a few weeks.  Significant degenerative changes seen medial compartment on ultrasound worse than appears on x-ray.  Discussed options.  Patient at this point has largely exhausted her initial conservative options.  We discussed that the next step is probably a steroid injection.  She would like to avoid that if possible and we will proceed with  oral prednisone. Additionally she is a good candidate for medial off loader  brace as she has worse medial compartment DJD than lateral and a little bit of laxity/instability. Continue other conservative management.  Work on Astronomer and weight loss.  Recheck back as needed.Marland Kitchen  PDMP not reviewed this encounter. Orders Placed This Encounter  Procedures  . [place] LIMITED JOINT SPACE STRUCTURES LOW RIGHT    Standing Status:   Future    Number of Occurrences:   1    Standing Expiration Date:   01/13/2021    Order Specific Question:   Reason for Exam (SYMPTOM  OR DIAGNOSIS REQUIRED)    Answer:   right knee pain    Order Specific Question:   Preferred imaging location?    Answer:   [facility]   Meds ordered this encounter  Medications  . predniSONE (DELTASONE) 50 MG tablet    Sig: Take 1 tablet (50 mg total) by mouth daily.    Dispense:  5 tablet    Refill:  0    Discussed warning signs or symptoms. Please see discharge instructions. Patient expresses understanding.   The above documentation has been reviewed and is accurate and complete Lynne Leader, M.D.

## 2020-01-20 ENCOUNTER — Other Ambulatory Visit: Payer: Self-pay | Admitting: Family Medicine

## 2020-01-20 DIAGNOSIS — I10 Essential (primary) hypertension: Secondary | ICD-10-CM

## 2020-01-20 NOTE — Telephone Encounter (Signed)
Requested Prescriptions  Pending Prescriptions Disp Refills   losartan-hydrochlorothiazide (HYZAAR) 100-25 MG tablet [Pharmacy Med Name: LOSARTAN/HCTZ 100/25MG  TABLETS] 90 tablet 1    Sig: TAKE 1 TABLET BY MOUTH DAILY     Cardiovascular: ARB + Diuretic Combos Passed - 01/20/2020 11:07 AM      Passed - K in normal range and within 180 days    Potassium  Date Value Ref Range Status  10/16/2019 3.5 3.5 - 5.2 mmol/L Final         Passed - Na in normal range and within 180 days    Sodium  Date Value Ref Range Status  10/16/2019 141 134 - 144 mmol/L Final         Passed - Cr in normal range and within 180 days    Creatinine, Ser  Date Value Ref Range Status  10/16/2019 0.80 0.57 - 1.00 mg/dL Final         Passed - Ca in normal range and within 180 days    Calcium  Date Value Ref Range Status  10/16/2019 9.6 8.7 - 10.2 mg/dL Final         Passed - Patient is not pregnant      Passed - Last BP in normal range    BP Readings from Last 1 Encounters:  01/14/20 128/88         Passed - Valid encounter within last 6 months    Recent Outpatient Visits          1 month ago Pneumonia due to COVID-19 virus   [facility] Jerrol Banana., MD   4 months ago Pneumonia due to COVID-19 virus   [facility] Rosanna Randy, Retia Passe., MD   5 months ago Pneumonia due to COVID-19 virus   [facility] Rosanna Randy, Retia Passe., MD   5 months ago Mild intermittent asthmatic bronchitis without complication   [facility] Carles Collet M, [place]   7 months ago Pain of right thumb   [facility] Jerrol Banana., MD      Future Appointments            In 4 months Kozlow, Donnamarie Poag, MD Allergy and [place]

## 2020-01-28 ENCOUNTER — Encounter (HOSPITAL_COMMUNITY): Payer: Self-pay | Admitting: Emergency Medicine

## 2020-01-28 ENCOUNTER — Ambulatory Visit (HOSPITAL_COMMUNITY)
Admission: EM | Admit: 2020-01-28 | Discharge: 2020-01-28 | Disposition: A | Discharge status: Home / Self Care | Payer: BC Managed Care – PPO | Attending: Emergency Medicine | Admitting: Emergency Medicine

## 2020-01-28 ENCOUNTER — Ambulatory Visit (INDEPENDENT_AMBULATORY_CARE_PROVIDER_SITE_OTHER): Payer: BC Managed Care – PPO

## 2020-01-28 ENCOUNTER — Other Ambulatory Visit: Payer: Self-pay

## 2020-01-28 DIAGNOSIS — R079 Chest pain, unspecified: Secondary | ICD-10-CM | POA: Diagnosis not present

## 2020-01-28 DIAGNOSIS — M546 Pain in thoracic spine: Secondary | ICD-10-CM | POA: Diagnosis not present

## 2020-01-28 MED ORDER — CYCLOBENZAPRINE HCL 5 MG PO TABS
5.0000 mg | ORAL_TABLET | Freq: Three times a day (TID) | ORAL | 0 refills | Status: DC | PRN
Start: 2020-01-28 — End: 2020-03-23

## 2020-01-28 NOTE — ED Provider Notes (Signed)
[facility]    CSN: [id number] Arrival date & time: 01/28/20  6568      History   Chief Complaint Chief Complaint  Patient presents with   Back Pain    HPI Gabrielle Terry is a 59 y.o. female.   Gabrielle Terry presents with complaints of pain to left thoracic back which started 4 days ago. No specific known trigger or injury. Has had low back pain in the past but otherwise denies any previous similar. Ache pain. Constant. Nothing specifically worsens it. Hasn't improved. Has been taking 500mg  of naproxen twice a day which hasn't helped. No chest pain . No shortness of breath, but does notice the pain more if she takes a deep breath. She works from home at a [employer]ilities [employer], but hasn't worked since before symptoms started. No increase in stress. No numbness/tingling or weakness to left arm. She is left handed. She had covid pnuemonia in January of this year. No further cough.     ROS per HPI, negative if not otherwise mentioned.      Past Medical History:  Diagnosis Date   Arthritis    knees   Asthma 2009   Breast hematoma 2011   Right   Colon polyp 2013   3   Constipation    Cough    from sinus drainage   COVID-19 virus infection 07/2019   Diffuse cystic mastopathy    Foot cramps    GERD (gastroesophageal reflux disease)    High cholesterol    Hypercholesteremia    Hypertension    Ulcer     Patient Active Problem List   Diagnosis Date Noted   Class 2 severe obesity due to excess calories with serious comorbidity and body mass index (BMI) of 37.0 to 37.9 in adult ([place]) 09/23/2019   Pneumonia due to COVID-19 virus 08/16/2019   Acute hypoxemic respiratory failure due to severe acute respiratory syndrome coronavirus 2 (SARS-CoV-2) disease ([place]) 08/16/2019   Essential hypertension    Allergic reaction to insect sting 01/08/2018   AB (asthmatic bronchitis) 10/14/2015   Abnormal bruising 10/14/2015   Toxic effect of venom 10/14/2015    Airway hyperreactivity 10/14/2015   Acid reflux 10/14/2015   Elective abortion 10/14/2015   Hypercholesteremia 10/14/2015   Cannot sleep 10/14/2015   Hemorrhoids, internal 10/14/2015   Climacteric 10/14/2015   Localized superficial swelling, mass, or lump 10/14/2015   Obesity (BMI 30.0-34.9) 10/14/2015   Plantar fasciitis 10/14/2015   Allergic rhinitis 04/07/2015   Arthritis of knee, degenerative 12/18/2014   Diffuse cystic mastopathy 01/28/2014   Personal history of colonic polyps 01/09/2013   Mild intermittent asthma 2009    Past Surgical History:  Procedure Laterality Date   ABDOMINAL HYSTERECTOMY  1997   BREAST SURGERY Right 2011   hematoma   [place]  2013   3 polyps/ Dr Jamal Collin   COLONOSCOPY WITH PROPOFOL N/A 03/21/2017   Procedure: COLONOSCOPY WITH PROPOFOL;  Surgeon: Christene Lye, MD;  Location: [facility] ENDOSCOPY;  Service: Endoscopy;  Laterality: N/A;   ESOPHAGOGASTRODUODENOSCOPY (EGD) WITH PROPOFOL N/A 01/26/2016   Procedure: ESOPHAGOGASTRODUODENOSCOPY (EGD) WITH PROPOFOL;  Surgeon: Manya Silvas, MD;  Location: [facility] ENDOSCOPY;  Service: Endoscopy;  Laterality: N/A;   ETHMOIDECTOMY Bilateral 04/29/2015   Procedure: TOTAL ETHMOIDECTOMY;  Surgeon: Margaretha Sheffield, MD;  Location: [place];  Service: ENT;  Laterality: Bilateral;   FRONTAL SINUS EXPLORATION Bilateral 04/29/2015   Procedure: FRONTAL SINUS EXPLORATION;  Surgeon: Margaretha Sheffield,  MD;  Location: [place];  Service: ENT;  Laterality: Bilateral;   IMAGE GUIDED SINUS SURGERY N/A 04/29/2015   Procedure: IMAGE GUIDED SINUS SURGERY;  Surgeon: Margaretha Sheffield, MD;  Location: [place];  Service: ENT;  Laterality: N/A;  GAVE DISK TO CE CE   MAXILLARY ANTROSTOMY Bilateral 04/29/2015   Procedure: MAXILLARY ANTROSTOMY WITH REMOVAL OF CONTENTS;  Surgeon: Margaretha Sheffield, MD;  Location[place];  Service: ENT;  Laterality: Bilateral;     [place]  2013   salpingo oophorectmy  1997   SPHENOIDECTOMY Bilateral 04/29/2015   Procedure: SPHENOIDECTOMY;  Surgeon: Margaretha Sheffield, MD;  Location: [place];  Service: ENT;  Laterality: Bilateral;   TUBAL LIGATION     UTERINE FIBROID SURGERY     removed    OB History    Gravida  2   Para  2   Term      Preterm      AB      Living  2     SAB      TAB      Ectopic      Multiple      Live Births           Obstetric Comments  Menstrual: 12 Age 1st Pregnancy: 22         Home Medications    Prior to Admission medications   Medication Sig Start Date End Date Taking? Authorizing Provider  albuterol (VENTOLIN HFA) 108 (90 Base) MCG/ACT inhaler Inhale 2 puffs into the lungs every 6 (six) hours as needed for wheezing or shortness of breath. 10/16/19   Briscoe Deutscher, DO  amLODipine (NORVASC) 2.5 MG tablet TAKE 1 TABLET(2.5 MG) BY MOUTH DAILY 12/18/19   Jerrol Banana., MD  cyclobenzaprine (FLEXERIL) 5 MG tablet Take 1 tablet (5 mg total) by mouth 3 (three) times daily as needed for muscle spasms. 01/28/20   Augusto Gamble B, NP  EPINEPHrine (AUVI-Q) 0.3 mg/0.3 mL IJ SOAJ injection Use as directed for life-threatening allergic reaction. 10/28/19   Valentina Shaggy, MD  estradiol (ESTRACE) 1 MG tablet Take 1 mg daily by mouth.    [provider]  FASENRA PEN 30 MG/ML SOAJ  08/27/19   [provider]  fluticasone-salmeterol (ADVAIR HFA) 230-21 MCG/ACT inhaler Inhale 2 puffs into the lungs 2 (two) times daily. 07/15/19   Kozlow, Donnamarie Poag, MD  losartan-hydrochlorothiazide (HYZAAR) 100-25 MG tablet TAKE 1 TABLET BY MOUTH DAILY 01/20/20   Jerrol Banana., MD  lovastatin (MEVACOR) 20 MG tablet TAKE 1 TABLET(20 MG) BY MOUTH DAILY Patient taking differently: daily at 6 PM.  08/11/19   Jerrol Banana., MD  metFORMIN (GLUCOPHAGE) 500 MG tablet Take 1 tablet (500 mg total) by mouth daily. 01/08/20    Briscoe Deutscher, DO  phentermine (ADIPEX-P) 37.5 MG tablet Take 0.5 tablets (18.75 mg total) by mouth daily before breakfast. 12/16/19   Briscoe Deutscher, DO  polyethylene glycol (MIRALAX / GLYCOLAX) packet Take 17 g by mouth daily.    [provider]  predniSONE (DELTASONE) 50 MG tablet Take 1 tablet (50 mg total) by mouth daily. Patient not taking: Reported on 01/28/2020 01/14/20   Gregor Hams, MD    Family History Family History  Adopted: Yes  Problem Relation Age of Onset   Congestive Heart Failure Father    Cancer Brother    Other Brother        brain  tumor   Hypertension Maternal Aunt    Thyroid disease Maternal Aunt    Diabetes Maternal Grandmother    Asthma Son    Angioedema Neg Hx    Allergic rhinitis Neg Hx    Atopy Neg Hx    Eczema Neg Hx    Immunodeficiency Neg Hx    Urticaria Neg Hx     Social History Social History   Tobacco Use   Smoking status: Never Smoker   Smokeless tobacco: Never Used  Substance Use Topics   Alcohol use: No    Alcohol/week: 1.0 standard drink    Types: 1 Glasses of wine per week   Drug use: No     Allergies   Patient has no known allergies.   Review of Systems Review of Systems   Physical Exam Triage Vital Signs ED Triage Vitals [01/28/20 1018]  Enc Vitals Group     BP (!) 159/99     Pulse Rate 68     Resp 18     Temp 98.3 F (36.8 C)     Temp Source Oral     SpO2 97 %     Weight      Height      Head Circumference      Peak Flow      Pain Score 8     Pain Loc      Pain Edu?      Excl. in [place]?    No data found.  Updated Vital Signs BP (!) 159/99 (BP Location: Right Arm)    Pulse 68    Temp 98.3 F (36.8 C) (Oral)    Resp 18    SpO2 97%    Physical Exam Constitutional:      General: She is not in acute distress.    Appearance: She is well-developed.  Cardiovascular:     Rate and Rhythm: Normal rate and regular rhythm.  Pulmonary:     Effort: Pulmonary effort is normal.      Breath sounds: Normal breath sounds and air entry. No decreased air movement.  Musculoskeletal:       Arms:     Comments: Point tenderness to left trapezius on palpation; full ROM to left upper extremity; strength and sensation equal to upper extremities  Skin:    General: Skin is warm and dry.  Neurological:     Mental Status: She is alert and oriented to person, place, and time.    EKG:  NSR rate of 64 with pvc noted . Previous EKG was available for review. No stwave changes as interpreted by me.    UC Treatments / Results  Labs (all labs ordered are listed, but only abnormal results are displayed) Labs Reviewed - No data to display  EKG   Radiology DG Chest 2 View  Result Date: 01/28/2020 CLINICAL DATA:  Chest pain EXAM: CHEST - 2 VIEW COMPARISON:  August 16, 2019 FINDINGS: Lungs are clear. Heart size and pulmonary vascularity are normal. No adenopathy. No pneumothorax. There is degenerative change in the mid to lower thoracic spine. IMPRESSION: Lungs clear.  Cardiac silhouette normal.  No adenopathy. Electronically Signed   By: Lowella Grip III M.D.   On: 01/28/2020 11:26    Procedures Procedures (including critical care time)  Medications Ordered in UC Medications - No data to display  Initial Impression / Assessment and Plan / UC Course  I have reviewed the triage vital signs and the nursing notes.  Pertinent labs & imaging results  that were available during my care of the patient were reviewed by me and considered in my medical decision making (see chart for details).     Left trapezius with mild point tenderness on palpation; although does have a constant ache to the area. Chest xray and ekg without acute findings. No other red flag findings. Flexeril provided with supportive cares recommended. Return precautions provided. If symptoms worsen or do not improve in the next week to return to be seen or to follow up with PCP.  Patient verbalized understanding and  agreeable to plan.    Final Clinical Impressions(s) / UC Diagnoses   Final diagnoses:  Acute left-sided thoracic back pain     Discharge Instructions     Your chest xray and ekg look well today, no concerning findings.  We will treat this discomfort as muscular in nature at this time. Light and regular activity and stretching as able. Heat application, massage to the area.   Maintain good ergonomic positioning while working when you return to work tomorrow.  Continue with naproxen twice a day, with food.  Flexeril as needed as a muscle relaxer to see if this is helpful. May be helpful to use before bed as can cause drowsiness.  May cause drowsiness. Please do not take if driving or drinking alcohol.   If symptoms worsen or do not improve in the next week to return to be seen or to follow up with your PCP.      ED Prescriptions    Medication Sig Dispense Auth. Provider   cyclobenzaprine (FLEXERIL) 5 MG tablet Take 1 tablet (5 mg total) by mouth 3 (three) times daily as needed for muscle spasms. 15 tablet Zigmund Gottron, NP     PDMP not reviewed this encounter.   Zigmund Gottron, NP 01/28/20 1229

## 2020-01-28 NOTE — ED Triage Notes (Signed)
Pt here with upper left sided back pain x 4 days

## 2020-01-28 NOTE — Discharge Instructions (Addendum)
Your chest xray and ekg look well today, no concerning findings.  We will treat this discomfort as muscular in nature at this time. Light and regular activity and stretching as able. Heat application, massage to the area.   Maintain good ergonomic positioning while working when you return to work tomorrow.  Continue with naproxen twice a day, with food.  Flexeril as needed as a muscle relaxer to see if this is helpful. May be helpful to use before bed as can cause drowsiness.  May cause drowsiness. Please do not take if driving or drinking alcohol.   If symptoms worsen or do not improve in the next week to return to be seen or to follow up with your PCP.

## 2020-01-30 DIAGNOSIS — M1711 Unilateral primary osteoarthritis, right knee: Secondary | ICD-10-CM | POA: Diagnosis not present

## 2020-01-31 ENCOUNTER — Other Ambulatory Visit: Payer: Self-pay | Admitting: Family Medicine

## 2020-01-31 NOTE — Telephone Encounter (Signed)
Requested Prescriptions  Pending Prescriptions Disp Refills   lovastatin (MEVACOR) 20 MG tablet [Pharmacy Med Name: LOVASTATIN 20MG  TABLETS] 30 tablet 1    Sig: TAKE 1 TABLET(20 MG) BY MOUTH DAILY     Cardiovascular:  Antilipid - Statins Failed - 01/31/2020  9:43 PM      Failed - LDL in normal range and within 360 days    LDL Chol Calc (NIH)  Date Value Ref Range Status  10/16/2019 131 (H) 0 - 99 mg/dL Final         Passed - Total Cholesterol in normal range and within 360 days    Cholesterol, Total  Date Value Ref Range Status  10/16/2019 191 100 - 199 mg/dL Final         Passed - HDL in normal range and within 360 days    HDL  Date Value Ref Range Status  10/16/2019 50 >39 mg/dL Final         Passed - Triglycerides in normal range and within 360 days    Triglycerides  Date Value Ref Range Status  10/16/2019 55 0 - 149 mg/dL Final         Passed - Patient is not pregnant      Passed - Valid encounter within last 12 months    Recent Outpatient Visits          2 months ago Pneumonia due to COVID-19 virus   [facility] Jerrol Banana., MD   4 months ago Pneumonia due to COVID-19 virus   [facility] Rosanna Randy, Retia Passe., MD   5 months ago Pneumonia due to COVID-19 virus   [facility] Rosanna Randy, Retia Passe., MD   5 months ago Mild intermittent asthmatic bronchitis without complication   [place], [place], [place]   8 months ago Pain of right thumb   [facility] Jerrol Banana., MD      Future Appointments            In 4 months Kozlow, Donnamarie Poag, MD Allergy and [place]

## 2020-02-02 ENCOUNTER — Ambulatory Visit (INDEPENDENT_AMBULATORY_CARE_PROVIDER_SITE_OTHER): Payer: BC Managed Care – PPO | Admitting: Family Medicine

## 2020-02-02 ENCOUNTER — Other Ambulatory Visit: Payer: Self-pay

## 2020-02-02 ENCOUNTER — Encounter (INDEPENDENT_AMBULATORY_CARE_PROVIDER_SITE_OTHER): Payer: Self-pay | Admitting: Family Medicine

## 2020-02-02 VITALS — BP 126/77 | HR 69 | Temp 98.0°F | Ht 65.0 in | Wt 205.0 lb

## 2020-02-02 DIAGNOSIS — M5489 Other dorsalgia: Secondary | ICD-10-CM | POA: Diagnosis not present

## 2020-02-02 DIAGNOSIS — Z9189 Other specified personal risk factors, not elsewhere classified: Secondary | ICD-10-CM | POA: Diagnosis not present

## 2020-02-02 DIAGNOSIS — Z6834 Body mass index (BMI) 34.0-34.9, adult: Secondary | ICD-10-CM

## 2020-02-02 DIAGNOSIS — E669 Obesity, unspecified: Secondary | ICD-10-CM | POA: Diagnosis not present

## 2020-02-02 DIAGNOSIS — R7303 Prediabetes: Secondary | ICD-10-CM

## 2020-02-02 NOTE — Progress Notes (Signed)
Chief Complaint:   OBESITY Gabrielle Terry is here to discuss her progress with her obesity treatment plan along with follow-up of her obesity related diagnoses. Gabrielle Terry is on the Category 1 Plan and states she is following her eating plan approximately 80% of the time. Gabrielle Terry states she is doing strength training and cardio for 30 minutes 1 times per week.  Today's visit was #: 6 Starting weight: 209 lbs Starting date: 10/16/2019 Today's weight: 205 lbs Today's date: 02/02/2020 Total lbs lost to date: 4 lbs Total lbs lost since last in-office visit: 4 lbs  Interim History: Gabrielle Terry says she is still loving personal training with "Satan's little brother".  She reports that her knee pain is improving.  She went to UC on 01/28/2020 with mid-back pain.  Reviewed.  Subjective:   1. Prediabetes Gabrielle Terry has a diagnosis of prediabetes based on her elevated HgA1c and was informed this puts her at greater risk of developing diabetes. She continues to work on diet and exercise to decrease her risk of diabetes. She denies nausea or hypoglycemia.  Lab Results  Component Value Date   HGBA1C 5.9 (H) 10/16/2019   Lab Results  Component Value Date   INSULIN 10.1 10/16/2019   2. Other back pain, unspecified chronicity Reviewed UC note.  Pain is actually more concerning for gastritis.    3. At risk for nausea Gabrielle Terry is at risk for nausea due to medication.  Assessment/Plan:   1. Prediabetes Gabrielle Terry will continue to work on weight loss, exercise, and decreasing simple carbohydrates to help decrease the risk of diabetes.   Orders - metFORMIN (GLUCOPHAGE) 500 MG tablet; Take 1 tablet (500 mg total) by mouth daily.  Dispense: 30 tablet; Refill: 0  2. Other back pain, concern for gastritis/PUD Instructed Gabrielle Terry to take Nexium twice daily and Pepcid AC daily.  3. At risk for nausea Gabrielle Terry was given approximately 15 minutes of nausea prevention counseling today. Gabrielle Terry is at risk for nausea due to  her new or current medication. She was encouraged to titrate her medication slowly, make sure to stay hydrated, eat smaller portions throughout the day, and avoid high fat meals.   4. Class 1 obesity with serious comorbidity and body mass index (BMI) of 34.0 to 34.9 in adult, unspecified obesity type Gabrielle Terry is currently in the action stage of change. As such, her goal is to continue with weight loss efforts. She has agreed to the Category 1 Plan.   Exercise goals: For substantial health benefits, adults should do at least 150 minutes (2 hours and 30 minutes) a week of moderate-intensity, or 75 minutes (1 hour and 15 minutes) a week of vigorous-intensity aerobic physical activity, or an equivalent combination of moderate- and vigorous-intensity aerobic activity. Aerobic activity should be performed in episodes of at least 10 minutes, and preferably, it should be spread throughout the week.  Behavioral modification strategies: increasing lean protein intake, increasing water intake and decreasing sodium intake.  Gabrielle Terry has agreed to follow-up with our clinic in 2-3 weeks. She was informed of the importance of frequent follow-up visits to maximize her success with intensive lifestyle modifications for her multiple health conditions.   Objective:   Blood pressure 126/77, pulse 69, temperature 98 F (36.7 C), temperature source Oral, height 5\' 5"  (1.651 m), weight 205 lb (93 kg), SpO2 98 %. Body mass index is 34.11 kg/m.  General: Cooperative, alert, well developed, in no acute distress. HEENT: Conjunctivae and lids unremarkable. Cardiovascular: Regular rhythm.  Lungs:  Normal work of breathing. Neurologic: No focal deficits.   Lab Results  Component Value Date   CREATININE 0.80 10/16/2019   BUN 15 10/16/2019   NA 141 10/16/2019   K 3.5 10/16/2019   CL 102 10/16/2019   CO2 24 10/16/2019   Lab Results  Component Value Date   ALT 14 10/16/2019   AST 16 10/16/2019   ALKPHOS 57 10/16/2019     BILITOT 0.3 10/16/2019   Lab Results  Component Value Date   HGBA1C 5.9 (H) 10/16/2019   Lab Results  Component Value Date   INSULIN 10.1 10/16/2019   Lab Results  Component Value Date   TSH 1.610 10/16/2019   Lab Results  Component Value Date   CHOL 191 10/16/2019   HDL 50 10/16/2019   LDLCALC 131 (H) 10/16/2019   TRIG 55 10/16/2019   Lab Results  Component Value Date   WBC 5.0 10/16/2019   HGB 13.7 10/16/2019   HCT 40.6 10/16/2019   MCV 85 10/16/2019   PLT 220 10/16/2019   Lab Results  Component Value Date   IRON 45 10/16/2019   TIBC 237 (L) 10/16/2019   FERRITIN 241 (H) 10/16/2019   Attestation Statements:   Reviewed by clinician on day of visit: allergies, medications, problem list, medical history, surgical history, family history, social history, and previous encounter notes.  I, Water quality scientist, CMA, am acting as transcriptionist for Briscoe Deutscher, DO  I have reviewed the above documentation for accuracy and completeness, and I agree with the above. Briscoe Deutscher, DO

## 2020-02-03 MED ORDER — METFORMIN HCL 500 MG PO TABS: 500.0000 mg | ORAL_TABLET | Freq: Every day | ORAL | 0 refills | Status: DC

## 2020-02-17 ENCOUNTER — Ambulatory Visit: Payer: Self-pay

## 2020-02-17 ENCOUNTER — Other Ambulatory Visit: Payer: Self-pay

## 2020-02-23 ENCOUNTER — Other Ambulatory Visit (INDEPENDENT_AMBULATORY_CARE_PROVIDER_SITE_OTHER): Payer: Self-pay | Admitting: Family Medicine

## 2020-02-23 DIAGNOSIS — R632 Polyphagia: Secondary | ICD-10-CM

## 2020-02-24 ENCOUNTER — Other Ambulatory Visit: Payer: Self-pay

## 2020-02-24 ENCOUNTER — Ambulatory Visit (INDEPENDENT_AMBULATORY_CARE_PROVIDER_SITE_OTHER): Payer: BC Managed Care – PPO

## 2020-02-24 DIAGNOSIS — J4550 Severe persistent asthma, uncomplicated: Secondary | ICD-10-CM

## 2020-02-24 MED ORDER — BENRALIZUMAB 30 MG/ML ~~LOC~~ SOSY
30.0000 mg | PREFILLED_SYRINGE | SUBCUTANEOUS | Status: AC
Start: 2020-02-24 — End: ?
  Administered 2020-02-24 – 2021-03-04 (×9): 30 mg via SUBCUTANEOUS

## 2020-02-25 ENCOUNTER — Encounter (INDEPENDENT_AMBULATORY_CARE_PROVIDER_SITE_OTHER): Payer: Self-pay

## 2020-02-25 MED ORDER — PHENTERMINE HCL 37.5 MG PO TABS: 18.7500 mg | ORAL_TABLET | Freq: Every day | ORAL | 0 refills | Status: DC

## 2020-02-25 NOTE — Telephone Encounter (Signed)
My chart message sent to pt.

## 2020-03-01 ENCOUNTER — Ambulatory Visit (INDEPENDENT_AMBULATORY_CARE_PROVIDER_SITE_OTHER): Payer: BC Managed Care – PPO | Admitting: Family Medicine

## 2020-03-01 ENCOUNTER — Other Ambulatory Visit: Payer: Self-pay

## 2020-03-01 ENCOUNTER — Other Ambulatory Visit (INDEPENDENT_AMBULATORY_CARE_PROVIDER_SITE_OTHER): Payer: Self-pay | Admitting: Family Medicine

## 2020-03-01 ENCOUNTER — Encounter (INDEPENDENT_AMBULATORY_CARE_PROVIDER_SITE_OTHER): Payer: Self-pay | Admitting: Family Medicine

## 2020-03-01 VITALS — BP 129/84 | HR 70 | Temp 98.0°F | Ht 65.0 in | Wt 204.0 lb

## 2020-03-01 DIAGNOSIS — Z9189 Other specified personal risk factors, not elsewhere classified: Secondary | ICD-10-CM

## 2020-03-01 DIAGNOSIS — M1711 Unilateral primary osteoarthritis, right knee: Secondary | ICD-10-CM | POA: Diagnosis not present

## 2020-03-01 DIAGNOSIS — I10 Essential (primary) hypertension: Secondary | ICD-10-CM

## 2020-03-01 DIAGNOSIS — R7303 Prediabetes: Secondary | ICD-10-CM

## 2020-03-01 DIAGNOSIS — E669 Obesity, unspecified: Secondary | ICD-10-CM

## 2020-03-01 DIAGNOSIS — Z6834 Body mass index (BMI) 34.0-34.9, adult: Secondary | ICD-10-CM

## 2020-03-01 MED ORDER — METFORMIN HCL 500 MG PO TABS: 500.0000 mg | ORAL_TABLET | Freq: Every day | ORAL | 0 refills | Status: DC

## 2020-03-01 NOTE — Progress Notes (Signed)
Chief Complaint:   OBESITY Gabrielle Terry is here to discuss her progress with her obesity treatment plan along with follow-up of her obesity related diagnoses. Gabrielle Terry is on the Category 1 Plan and states she is following her eating plan approximately 70% of the time. Gabrielle Terry states she is walking and seeing a trainer for 30-60 minutes 1-4 times per week.  Today's visit was #: 7 Starting weight: 209 lbs Starting date: 10/16/2019 Today's weight: 204 lbs Today's date: 03/01/2020 Total lbs lost to date: 5 lbs Total lbs lost since last in-office visit: 1 lb  Interim History: Gabrielle Terry says her polyphagia is controlled with 1/2 dose of phentermine.  She is tolerating it well, without side effects.  Subjective:   1. Osteoarthritis of right knee Gabrielle Terry has chronic OA of her right knee.  2. Essential hypertension Review: taking medications as instructed, no medication side effects noted, no chest pain on exertion, no dyspnea on exertion, no swelling of ankles.   BP Readings from Last 3 Encounters:  03/01/20 129/84  02/02/20 126/77  01/28/20 (!) 159/99   3. Prediabetes  Lab Results  Component Value Date   HGBA1C 5.9 (H) 10/16/2019   Lab Results  Component Value Date   INSULIN 10.1 10/16/2019   4. At risk for constipation [name] is at increased risk for constipation due to her current medications and an increase in protein in her diet. Gabrielle Terry denies hard, infrequent stools currently.    Assessment/Plan:   1. Osteoarthritis of right knee Worsened. Will follow because mobility and pain control are important for weight management.  2. Essential hypertension Gabrielle Terry is working on healthy weight loss and exercise to improve blood pressure control. We will watch for signs of hypotension as she continues her lifestyle modifications.  3. Prediabetes Not optimized. Goal is HgbA1c < 5.7 and insulin level closer to 5.    Orders - metFORMIN (GLUCOPHAGE) 500 MG tablet; Take 1 tablet (500 mg  total) by mouth daily.  Dispense: 30 tablet; Refill: 0  4. At risk for constipation Gabrielle Terry was given approximately 15 minutes of counseling today regarding prevention of constipation. She was encouraged to increase water and fiber intake.   5. Class 1 obesity with serious comorbidity and body mass index (BMI) of 34.0 to 34.9 in adult, unspecified obesity type Gabrielle Terry is currently in the action stage of change. As such, her goal is to continue with weight loss efforts. She has agreed to the Category 1 Plan.   Exercise goals: For substantial health benefits, adults should do at least 150 minutes (2 hours and 30 minutes) a week of moderate-intensity, or 75 minutes (1 hour and 15 minutes) a week of vigorous-intensity aerobic physical activity, or an equivalent combination of moderate- and vigorous-intensity aerobic activity. Aerobic activity should be performed in episodes of at least 10 minutes, and preferably, it should be spread throughout the week.  Behavioral modification strategies: increasing lean protein intake and increasing high fiber foods.  Gabrielle Terry has agreed to follow-up with our clinic in 3 weeks. She was informed of the importance of frequent follow-up visits to maximize her success with intensive lifestyle modifications for her multiple health conditions.   Objective:   Blood pressure 129/84, pulse 70, temperature 98 F (36.7 C), temperature source Oral, height 5\' 5"  (1.651 m), weight 204 lb (92.5 kg), SpO2 98 %. Body mass index is 33.95 kg/m.  General: Cooperative, alert, well developed, in no acute distress. HEENT: Conjunctivae and lids unremarkable. Cardiovascular: Regular rhythm.  Lungs: Normal  work of breathing. Neurologic: No focal deficits.   Lab Results  Component Value Date   CREATININE 0.80 10/16/2019   BUN 15 10/16/2019   NA 141 10/16/2019   K 3.5 10/16/2019   CL 102 10/16/2019   CO2 24 10/16/2019   Lab Results  Component Value Date   ALT 14 10/16/2019    AST 16 10/16/2019   ALKPHOS 57 10/16/2019   BILITOT 0.3 10/16/2019   Lab Results  Component Value Date   HGBA1C 5.9 (H) 10/16/2019   Lab Results  Component Value Date   INSULIN 10.1 10/16/2019   Lab Results  Component Value Date   TSH 1.610 10/16/2019   Lab Results  Component Value Date   CHOL 191 10/16/2019   HDL 50 10/16/2019   LDLCALC 131 (H) 10/16/2019   TRIG 55 10/16/2019   Lab Results  Component Value Date   WBC 5.0 10/16/2019   HGB 13.7 10/16/2019   HCT 40.6 10/16/2019   MCV 85 10/16/2019   PLT 220 10/16/2019   Lab Results  Component Value Date   IRON 45 10/16/2019   TIBC 237 (L) 10/16/2019   FERRITIN 241 (H) 10/16/2019   Attestation Statements:   Reviewed by clinician on day of visit: allergies, medications, problem list, medical history, surgical history, family history, social history, and previous encounter notes.  I, Water quality scientist, CMA, am acting as transcriptionist for Briscoe Deutscher, DO  I have reviewed the above documentation for accuracy and completeness, and I agree with the above. Briscoe Deutscher, DO

## 2020-03-14 ENCOUNTER — Other Ambulatory Visit: Payer: Self-pay | Admitting: Family Medicine

## 2020-03-14 DIAGNOSIS — I10 Essential (primary) hypertension: Secondary | ICD-10-CM

## 2020-03-23 ENCOUNTER — Ambulatory Visit (INDEPENDENT_AMBULATORY_CARE_PROVIDER_SITE_OTHER): Payer: BC Managed Care – PPO | Admitting: Family Medicine

## 2020-03-23 ENCOUNTER — Encounter (INDEPENDENT_AMBULATORY_CARE_PROVIDER_SITE_OTHER): Payer: Self-pay | Admitting: Family Medicine

## 2020-03-23 ENCOUNTER — Other Ambulatory Visit: Payer: Self-pay

## 2020-03-23 VITALS — BP 148/94 | HR 71 | Temp 98.0°F | Ht 65.0 in | Wt 204.0 lb

## 2020-03-23 DIAGNOSIS — R7303 Prediabetes: Secondary | ICD-10-CM | POA: Diagnosis not present

## 2020-03-23 DIAGNOSIS — Z6834 Body mass index (BMI) 34.0-34.9, adult: Secondary | ICD-10-CM | POA: Diagnosis not present

## 2020-03-23 DIAGNOSIS — I10 Essential (primary) hypertension: Secondary | ICD-10-CM | POA: Diagnosis not present

## 2020-03-23 DIAGNOSIS — E669 Obesity, unspecified: Secondary | ICD-10-CM | POA: Diagnosis not present

## 2020-03-23 MED ORDER — METFORMIN HCL 500 MG PO TABS: 500.0000 mg | ORAL_TABLET | Freq: Every day | ORAL | 0 refills | Status: DC

## 2020-03-23 MED ORDER — PHENTERMINE HCL 37.5 MG PO TABS: 18.7500 mg | ORAL_TABLET | Freq: Every day | ORAL | 0 refills | Status: DC

## 2020-03-23 NOTE — Progress Notes (Signed)
Chief Complaint:   OBESITY Gabrielle Terry is here to discuss her progress with her obesity treatment plan along with follow-up of her obesity related diagnoses. Gabrielle Terry is on the Category 1 Plan and states she is following her eating plan approximately 80% of the time. Gabrielle Terry states she is walking for 45-45 minutes 3-4 times per week.  Today's visit was #: 8 Starting weight: 209 lbs Starting date: 10/16/2019 Today's weight: 204 lbs Today's date: 03/23/2020 Total lbs lost to date: 5 lbs Total lbs lost since last in-office visit: 0  Interim History: Gabrielle Terry endorses polyphagia.  She says that she is going to the beach.  Assessment/Plan:   1. Essential hypertension Blood pressure is elevated today.  She has not taken her blood pressure medication yet.  Cardiovascular ROS: negative for - chest pain, dyspnea on exertion, edema or irregular heartbeat. She will continue to monitor.    2. Prediabetes Not optimized. Goal is HgbA1c < 5.7 and insulin level closer to 5. Gabrielle Terry will continue to work on weight loss, exercise, and decreasing simple carbohydrates to help decrease the risk of diabetes.  She is taking metformin 500 mg daily.  -Refill metFORMIN (GLUCOPHAGE) 500 MG tablet; Take 1 tablet (500 mg total) by mouth daily.  Dispense: 30 tablet; Refill: 0  3. Class 1 obesity with serious comorbidity and body mass index (BMI) of 34.0 to 34.9 in adult, unspecified obesity type This patient 1) has no evidence of serious cardiovascular disease; 2) does not have serious psychiatric disease or a history of substance abuse; 3) has been informed about weight loss medications that are FDA-approved for long term use and told that these have been documented to be safe and effective whereas phentermine has not; 4) does not demonstrate a clinically significant increase in pulse or BP when taking phentermine; and 5) demonstrates significant weight loss while using the medication. Pt denies a history of glaucoma.  Patient understands that long-term use of phentermine is considered off-label use of this medication, however, that the Endocrine Society and recent research supports that long-term use of phentermine does not appear to have detrimental health effects when used in the appropriate patient. In addition, a 2019 study published in Obesity Journal on [id number] patients concluded that "recommendations to limit phentermine to less than 3 months do not align with current concepts of pharmacologic treatment of obesity", and that "long term phentermine users experience greater weight loss without apparent increases in cardiovascular risk."  We reviewed potential side effects including insomnia, dry mouth, increased heart rate and blood pressure, increased anxiety. We reviewed reducing caffeine consumption while taking phentermine, especially if the patient is experiencing side effects. The potential risks and benefits of phentermine have been reviewed with the patient, and alternative treatment options were discussed. All questions were answered, and the patient wishes to move forward with this medication.  -Refill phentermine (ADIPEX-P) 37.5 MG tablet; Take 0.5 tablets (18.75 mg total) by mouth daily before breakfast.  Dispense: 30 tablet; Refill: 0  I have consulted the [place] Controlled Substances Registry for this patient, and feel the risk/benefit ratio today is favorable for proceeding with this prescription for a controlled substance. The patient understands monitoring parameters and red flags.   Gabrielle Terry is currently in the action stage of change. As such, her goal is to continue with weight loss efforts. She has agreed to the Category 1 Plan.   Exercise goals: As tolerated.  Behavioral modification strategies: increasing water intake and decreasing sodium intake.  Gabrielle Terry has agreed  to follow-up with our clinic in 3 weeks. She was informed of the importance of frequent follow-up visits to maximize her success  with intensive lifestyle modifications for her multiple health conditions.   Objective:   Blood pressure (!) 148/94, pulse 71, temperature 98 F (36.7 C), temperature source Oral, height 5\' 5"  (1.651 m), weight 204 lb (92.5 kg), SpO2 97 %. Body mass index is 33.95 kg/m.  General: Cooperative, alert, well developed, in no acute distress. HEENT: Conjunctivae and lids unremarkable. Cardiovascular: Regular rhythm.  Lungs: Normal work of breathing. Neurologic: No focal deficits.   Lab Results  Component Value Date   CREATININE 0.80 10/16/2019   BUN 15 10/16/2019   NA 141 10/16/2019   K 3.5 10/16/2019   CL 102 10/16/2019   CO2 24 10/16/2019   Lab Results  Component Value Date   ALT 14 10/16/2019   AST 16 10/16/2019   ALKPHOS 57 10/16/2019   BILITOT 0.3 10/16/2019   Lab Results  Component Value Date   HGBA1C 5.9 (H) 10/16/2019   Lab Results  Component Value Date   INSULIN 10.1 10/16/2019   Lab Results  Component Value Date   TSH 1.610 10/16/2019   Lab Results  Component Value Date   CHOL 191 10/16/2019   HDL 50 10/16/2019   LDLCALC 131 (H) 10/16/2019   TRIG 55 10/16/2019   Lab Results  Component Value Date   WBC 5.0 10/16/2019   HGB 13.7 10/16/2019   HCT 40.6 10/16/2019   MCV 85 10/16/2019   PLT 220 10/16/2019   Lab Results  Component Value Date   IRON 45 10/16/2019   TIBC 237 (L) 10/16/2019   FERRITIN 241 (H) 10/16/2019   Attestation Statements:   Reviewed by clinician on day of visit: allergies, medications, problem list, medical history, surgical history, family history, social history, and previous encounter notes.  I, Water quality scientist, CMA, am acting as transcriptionist for Briscoe Deutscher, DO  I have reviewed the above documentation for accuracy and completeness, and I agree with the above. Briscoe Deutscher, DO

## 2020-04-02 ENCOUNTER — Encounter: Payer: Self-pay | Admitting: Allergy and Immunology

## 2020-04-07 DIAGNOSIS — Z86010 Personal history of colonic polyps: Secondary | ICD-10-CM | POA: Diagnosis not present

## 2020-04-07 DIAGNOSIS — K5909 Other constipation: Secondary | ICD-10-CM | POA: Diagnosis not present

## 2020-04-13 ENCOUNTER — Ambulatory Visit (INDEPENDENT_AMBULATORY_CARE_PROVIDER_SITE_OTHER): Payer: BC Managed Care – PPO | Admitting: Family Medicine

## 2020-04-13 ENCOUNTER — Encounter (INDEPENDENT_AMBULATORY_CARE_PROVIDER_SITE_OTHER): Payer: Self-pay | Admitting: Family Medicine

## 2020-04-13 ENCOUNTER — Other Ambulatory Visit: Payer: Self-pay

## 2020-04-13 VITALS — BP 124/74 | HR 76 | Temp 98.3°F | Ht 65.0 in | Wt 204.0 lb

## 2020-04-13 DIAGNOSIS — K5909 Other constipation: Secondary | ICD-10-CM

## 2020-04-13 DIAGNOSIS — E669 Obesity, unspecified: Secondary | ICD-10-CM | POA: Diagnosis not present

## 2020-04-13 DIAGNOSIS — Z6834 Body mass index (BMI) 34.0-34.9, adult: Secondary | ICD-10-CM

## 2020-04-13 DIAGNOSIS — R7303 Prediabetes: Secondary | ICD-10-CM

## 2020-04-13 DIAGNOSIS — I10 Essential (primary) hypertension: Secondary | ICD-10-CM | POA: Diagnosis not present

## 2020-04-13 NOTE — Patient Instructions (Signed)
GETTING TO GOOD BOWEL HEALTH  Irregular bowel habits such as constipation can lead to many problems over time.  Having one soft bowel movement a day is the most important way to prevent further problems.    The goal: ONE SOFT BOWEL MOVEMENT A DAY!  To have soft, regular bowel movements:  . Drink at least 8 tall glasses of water a day.   . Eat plenty of fiber.  Fiber is the undigested part of plant food that passes into the colon, acting s "natures broom" to encourage bowel motility and movement.  Fiber can absorb and hold large amounts of water. This results in a larger, bulkier stool, which is soft and easier to pass. Work gradually over several weeks up to 6 servings a day of fiber (25g a day even more if needed) in the form of: o Vegetables -- Leafy green (lettuce, salad greens, celery, spinach), or cooked (cabbage, broccoli, etc).  o Fruit -- Fresh (unpeeled skin & pulp), Dried (prunes, apricots, cherries, etc ),  or stewed (applesauce)  o Whole grain breads, pasta, etc (whole wheat)  o Bran cereals  . Bulking Agents -- This type of water-retaining fiber generally is easily obtained each day by one of the following:  o Psyllium bran -- The psyllium plant is remarkable because its ground seeds can retain so much water. This product is available as Metamucil, Konsyl, Effersyllium, Per Diem Fiber, or the less expensive generic preparation in drug and health food stores. Although labeled a laxative, it really is not a laxative.  o Methylcellulose -- This is another fiber derived from wood which also retains water. It is available as Citrucel. o Polyethylene Glycol - and "artificial" fiber commonly called Miralax or Glycolax.  It is helpful for people with gassy or bloated feelings with regular fiber o Flax Seed - a less gassy fiber than psyllium . No reading or other relaxing activity while on the toilet. If bowel movements take longer than 5 minutes, you are too constipated. . AVOID CONSTIPATION.   High fiber and water intake usually takes care of this.  Sometimes a laxative is needed to stimulate more frequent bowel movements, but  . Laxatives are not a good long-term solution as it can wear the colon out. o Osmotics (Milk of Magnesia, Fleets phosphosoda, Magnesium citrate, MiraLax, GoLytely) are safer than  o Stimulants (Senokot, Castor Oil, Dulcolax, Ex Lax)    o Do not take laxatives for more than 7days in a row.

## 2020-04-15 DIAGNOSIS — K581 Irritable bowel syndrome with constipation: Secondary | ICD-10-CM | POA: Diagnosis not present

## 2020-04-15 MED ORDER — PHENTERMINE HCL 37.5 MG PO TABS: 18.7500 mg | ORAL_TABLET | Freq: Every day | ORAL | 0 refills | Status: DC

## 2020-04-15 MED ORDER — METFORMIN HCL 500 MG PO TABS: 500.0000 mg | ORAL_TABLET | Freq: Two times a day (BID) | ORAL | 0 refills | Status: DC

## 2020-04-15 NOTE — Progress Notes (Signed)
Chief Complaint:   OBESITY Gabrielle Terry is here to discuss her progress with her obesity treatment plan along with follow-up of her obesity related diagnoses. Gabrielle Terry is on the Category 1 Plan and states she is following her eating plan approximately 50% of the time. Gabrielle Terry states she is walking and working with a trainer for 60 minutes 3 times per week.  Today's visit was #: 9 Starting weight: 209 lbs Starting date: 10/16/2019 Today's weight: 204 lbs Today's date: 04/13/2020 Total lbs lost to date: 5 lbs Total lbs lost since last in-office visit: 0 Total weight loss percentage to date: -2.39%  Interim History: Gabrielle Terry has been to GI at [place] for severe constipation.  She is taking Metamucil and MiraLAX. She was also given a sample medication for constipation that she feels has not been very helpful.  Assessment/Plan:   1. Other constipation Discussed bowel regimen.  See patient instructions.  Discussed increasing metformin to twice daily.  Increase MiraLAX to twice daily.  Add prunes and add enema if needed.  2. Prediabetes Not at goal. Goal is HgbA1c < 5.7 and insulin level closer to 5. She will continue to focus on protein-rich, low simple carbohydrate foods. We reviewed the importance of hydration, regular exercise for stress reduction, and restorative sleep.   Lab Results  Component Value Date   HGBA1C 5.9 (H) 10/16/2019   Lab Results  Component Value Date   INSULIN 10.1 10/16/2019   - Refill metFORMIN (GLUCOPHAGE) 500 MG tablet; Take 1 tablet (500 mg total) by mouth 2 (two) times daily with a meal.  Dispense: 60 tablet; Refill: 0  3. Essential hypertension Blood pressure is at goal. The current medical regimen is effective;  continue present plan and medications.  BP Readings from Last 3 Encounters:  04/13/20 124/74  03/23/20 (!) 148/94  03/01/20 129/84   4. Class 1 obesity with serious comorbidity and body mass index (BMI) of 34.0 to 34.9 in adult, unspecified  obesity type Patient understands that long-term use of phentermine is considered off-label use of this medication, however, that the Endocrine Society and recent research supports that long-term use of phentermine does not appear to have detrimental health effects when used in the appropriate patient. In addition, a 2019 study published in Obesity Journal on [id number] patients concluded that "recommendations to limit phentermine to less than 3 months do not align with current concepts of pharmacologic treatment of obesity", and that "long term phentermine users experience greater weight loss without apparent increases in cardiovascular risk".  We reviewed potential side effects including insomnia, dry mouth, increased heart rate and blood pressure, increased anxiety. We reviewed reducing caffeine consumption while taking phentermine, especially if the patient is experiencing side effects. All questions were answered, and the patient wishes to move forward with this medication.  - Refill phentermine (ADIPEX-P) 37.5 MG tablet; Take 0.5 tablets (18.75 mg total) by mouth daily before breakfast.  Dispense: 30 tablet; Refill: 0  I have consulted the [place] Controlled Substances Registry for this patient, and feel the risk/benefit ratio today is favorable for proceeding with this prescription for a controlled substance. The patient understands monitoring parameters and red flags.   Gabrielle Terry is currently in the action stage of change. As such, her goal is to continue with weight loss efforts. She has agreed to the Category 1 Plan.   Exercise goals: For substantial health benefits, adults should do at least 150 minutes (2 hours and 30 minutes) a week of moderate-intensity, or 75 minutes (  1 hour and 15 minutes) a week of vigorous-intensity aerobic physical activity, or an equivalent combination of moderate- and vigorous-intensity aerobic activity. Aerobic activity should be performed in episodes of at least 10 minutes, and  preferably, it should be spread throughout the week.  Behavioral modification strategies: increasing water intake and increasing high fiber foods.  Gabrielle Terry has agreed to follow-up with our clinic in 2-3 weeks. She was informed of the importance of frequent follow-up visits to maximize her success with intensive lifestyle modifications for her multiple health conditions.   Objective:   Blood pressure 124/74, pulse 76, temperature 98.3 F (36.8 C), temperature source Oral, height 5\' 5"  (1.651 m), weight 204 lb (92.5 kg), SpO2 99 %. Body mass index is 33.95 kg/m.  General: Cooperative, alert, well developed, in no acute distress. HEENT: Conjunctivae and lids unremarkable. Cardiovascular: Regular rhythm.  Lungs: Normal work of breathing. Neurologic: No focal deficits.   Lab Results  Component Value Date   CREATININE 0.80 10/16/2019   BUN 15 10/16/2019   NA 141 10/16/2019   K 3.5 10/16/2019   CL 102 10/16/2019   CO2 24 10/16/2019   Lab Results  Component Value Date   ALT 14 10/16/2019   AST 16 10/16/2019   ALKPHOS 57 10/16/2019   BILITOT 0.3 10/16/2019   Lab Results  Component Value Date   HGBA1C 5.9 (H) 10/16/2019   Lab Results  Component Value Date   INSULIN 10.1 10/16/2019   Lab Results  Component Value Date   TSH 1.610 10/16/2019   Lab Results  Component Value Date   CHOL 191 10/16/2019   HDL 50 10/16/2019   LDLCALC 131 (H) 10/16/2019   TRIG 55 10/16/2019   Lab Results  Component Value Date   WBC 5.0 10/16/2019   HGB 13.7 10/16/2019   HCT 40.6 10/16/2019   MCV 85 10/16/2019   PLT 220 10/16/2019   Lab Results  Component Value Date   IRON 45 10/16/2019   TIBC 237 (L) 10/16/2019   FERRITIN 241 (H) 10/16/2019   Attestation Statements:   Reviewed by clinician on day of visit: allergies, medications, problem list, medical history, surgical history, family history, social history, and previous encounter notes.  I, Water quality scientist, CMA, am acting as  transcriptionist for Briscoe Deutscher, DO  I have reviewed the above documentation for accuracy and completeness, and I agree with the above. Briscoe Deutscher, DO

## 2020-04-18 ENCOUNTER — Other Ambulatory Visit (INDEPENDENT_AMBULATORY_CARE_PROVIDER_SITE_OTHER): Payer: Self-pay | Admitting: Family Medicine

## 2020-04-18 DIAGNOSIS — R7303 Prediabetes: Secondary | ICD-10-CM

## 2020-04-20 ENCOUNTER — Ambulatory Visit: Payer: Self-pay

## 2020-04-27 ENCOUNTER — Ambulatory Visit (INDEPENDENT_AMBULATORY_CARE_PROVIDER_SITE_OTHER): Payer: BC Managed Care – PPO | Admitting: *Deleted

## 2020-04-27 ENCOUNTER — Other Ambulatory Visit: Payer: Self-pay

## 2020-04-27 DIAGNOSIS — J4550 Severe persistent asthma, uncomplicated: Secondary | ICD-10-CM

## 2020-05-02 ENCOUNTER — Other Ambulatory Visit: Payer: Self-pay | Admitting: Family Medicine

## 2020-05-02 DIAGNOSIS — I10 Essential (primary) hypertension: Secondary | ICD-10-CM

## 2020-05-05 ENCOUNTER — Ambulatory Visit (INDEPENDENT_AMBULATORY_CARE_PROVIDER_SITE_OTHER): Payer: BC Managed Care – PPO | Admitting: Family Medicine

## 2020-05-10 DIAGNOSIS — Z1211 Encounter for screening for malignant neoplasm of colon: Secondary | ICD-10-CM | POA: Diagnosis not present

## 2020-05-10 DIAGNOSIS — K581 Irritable bowel syndrome with constipation: Secondary | ICD-10-CM | POA: Diagnosis not present

## 2020-05-10 DIAGNOSIS — Z01818 Encounter for other preprocedural examination: Secondary | ICD-10-CM | POA: Diagnosis not present

## 2020-05-17 DIAGNOSIS — K529 Noninfective gastroenteritis and colitis, unspecified: Secondary | ICD-10-CM | POA: Diagnosis not present

## 2020-05-18 DIAGNOSIS — K635 Polyp of colon: Secondary | ICD-10-CM | POA: Diagnosis not present

## 2020-05-24 ENCOUNTER — Other Ambulatory Visit: Payer: Self-pay

## 2020-05-24 ENCOUNTER — Ambulatory Visit (INDEPENDENT_AMBULATORY_CARE_PROVIDER_SITE_OTHER): Payer: BC Managed Care – PPO | Admitting: Family Medicine

## 2020-05-24 ENCOUNTER — Encounter (INDEPENDENT_AMBULATORY_CARE_PROVIDER_SITE_OTHER): Payer: Self-pay | Admitting: Family Medicine

## 2020-05-24 VITALS — BP 131/86 | HR 80 | Temp 98.6°F | Ht 65.0 in | Wt 211.0 lb

## 2020-05-24 DIAGNOSIS — Z6835 Body mass index (BMI) 35.0-35.9, adult: Secondary | ICD-10-CM

## 2020-05-24 DIAGNOSIS — R632 Polyphagia: Secondary | ICD-10-CM | POA: Diagnosis not present

## 2020-05-24 DIAGNOSIS — Z9189 Other specified personal risk factors, not elsewhere classified: Secondary | ICD-10-CM | POA: Diagnosis not present

## 2020-05-24 DIAGNOSIS — R7303 Prediabetes: Secondary | ICD-10-CM | POA: Diagnosis not present

## 2020-05-24 DIAGNOSIS — K5909 Other constipation: Secondary | ICD-10-CM | POA: Diagnosis not present

## 2020-05-24 DIAGNOSIS — E669 Obesity, unspecified: Secondary | ICD-10-CM

## 2020-05-25 MED ORDER — METFORMIN HCL 500 MG PO TABS: 500.0000 mg | ORAL_TABLET | Freq: Two times a day (BID) | ORAL | 0 refills | Status: DC

## 2020-05-25 MED ORDER — PHENTERMINE HCL 37.5 MG PO TABS: 18.7500 mg | ORAL_TABLET | Freq: Every day | ORAL | 0 refills | Status: DC

## 2020-05-25 NOTE — Progress Notes (Signed)
Chief Complaint:   OBESITY Gabrielle Terry is here to discuss her progress with her obesity treatment plan along with follow-up of her obesity related diagnoses.   Today's visit was #: 10 Starting weight: 209 lbs Starting date: 10/16/2019 Today's weight: 211 lbs Today's date: 05/24/2020 Total lbs lost to date: +2 lbs Body mass index is 35.11 kg/m.   Interim History: Gabrielle Terry's last visit was on 04/13/2020.  Nutrition Plan: the Category 1 Plan for 25-30% of the time.  Anti-obesity medications: Phentermine 18.75 mg daily. Reported side effects: None. Activity: Working with a Clinical research associate for 30 minutes once a week. Sleep: Sleep is restful.   Assessment/Plan:   1. Prediabetes Not at goal. Goal is HgbA1c < 5.7 and insulin level closer to 5. Medication: Metformin. She will continue to focus on protein-rich, low simple carbohydrate foods. We reviewed the importance of hydration, regular exercise for stress reduction, and restorative sleep.   Lab Results  Component Value Date   HGBA1C 5.9 (H) 10/16/2019   Lab Results  Component Value Date   INSULIN 10.1 10/16/2019   - Refill metFORMIN (GLUCOPHAGE) 500 MG tablet; Take 1 tablet (500 mg total) by mouth 2 (two) times daily with a meal.  Dispense: 60 tablet; Refill: 0  2. Polyphagia Hyperphagia, also called polyphagia, refers to excessive feelings of hunger. This is more likely to be an issues for people that have diabetes, prediabetes, or insulin resistance. She will continue to focus on protein-rich, low simple carbohydrate foods. We reviewed the importance of hydration, regular exercise for stress reduction, and restorative sleep.  She has been out of phentermine for 1 month.  Will refill today.  3. Other constipation Worsened. We will continue to monitor symptoms as they relate to her weight loss journey.   Counseling Getting to Good Bowel Health: Your goal is to have one soft bowel movement each day. Drink at least 8 glasses of water each  day. Eat plenty of fiber (goal is over 25 grams each day). It is best to get most of your fiber from dietary sources which includes leafy green vegetables, fresh fruit, and whole grains. You may need to add fiber with the help of OTC fiber supplements. These include Metamucil, Citrucel, and Benefiber. If you are still having trouble, try adding Miralax or Magnesium Citrate. If all of these changes do not work, contact me.    4. At risk for heart disease Gabrielle Terry was given approximately 15 minutes of coronary artery disease prevention counseling today. She is 59 y.o. female and has risk factors for heart disease including obesity and IR. We discussed intensive lifestyle modifications today with an emphasis on specific weight loss instructions and strategies. Repetitive spaced learning was employed today to elicit superior memory formation and behavioral change.  During insulin resistance, several metabolic alterations induce the development of cardiovascular disease. For instance, insulin resistance can induce an imbalance in glucose metabolism that generates chronic hyperglycemia, which in turn triggers oxidative stress and causes an inflammatory response that leads to cell damage. Insulin resistance can also alter systemic lipid metabolism which then leads to the development of dyslipidemia and the well-known lipid triad: (1) high levels of plasma triglycerides, (2) low levels of high-density lipoprotein, and (3) the appearance of small dense low-density lipoproteins. This triad, along with endothelial dysfunction, which can also be induced by aberrant insulin signaling, contribute to atherosclerotic plaque formation.   5. Class 2 severe obesity with serious comorbidity and body mass index (BMI) of 35.0 to 35.9 in  adult, unspecified obesity type ([place]) This patient 1) has no evidence of serious cardiovascular disease; 2) does not have serious psychiatric disease or a history of substance abuse; 3) has been  informed about weight loss medications that are FDA-approved for long term use and told that these have been documented to be safe and effective; 4) does not demonstrate a clinically significant increase in pulse or BP when taking phentermine; and 5) demonstrates significant weight loss while using the medication. Patient understands that all anti-obesity medications are contraindicated in pregnancy. Pt denies a history of glaucoma.   Patient understands that long-term use of phentermine is considered off-label use of this medication, however, that the Endocrine Society and recent research supports that long-term use of phentermine does not appear to have detrimental health effects when used in the appropriate patient. In addition, a 2019 study published in Obesity Journal on [id number] patients concluded that "recommendations to limit phentermine to less than 3 months do not align with current concepts of pharmacologic treatment of obesity", and that "long term phentermine users experience greater weight loss without apparent increases in cardiovascular risk".  The potential risks and benefits of phentermine have been reviewed with the patient, and alternative treatment options were discussed. All questions were answered, and the patient wishes to move forward with this medication.  I have consulted the [place] Controlled Substances Registry for this patient, and feel the risk/benefit ratio today is favorable for proceeding with this prescription for a controlled substance. The patient understands monitoring parameters and red flags.   - Refill phentermine (ADIPEX-P) 37.5 MG tablet; Take 0.5 tablets (18.75 mg total) by mouth daily before breakfast.  Dispense: 30 tablet; Refill: 0   [name] is currently in the action stage of change. As such, her goal is to continue with weight loss efforts.   Nutrition goals: She has agreed to the Category 1 Plan.   Increase water intake.  Exercise goals: Add 1 day of  exercise per week.    Behavioral modification strategies: increasing lean protein intake, decreasing simple carbohydrates, increasing vegetables, increasing water intake, decreasing sodium intake, increasing high fiber foods and emotional eating strategies.  Vivienne has agreed to follow-up with our clinic in 3 weeks. She was informed of the importance of frequent follow-up visits to maximize her success with intensive lifestyle modifications for her multiple health conditions.   Objective:   Blood pressure 131/86, pulse 80, temperature 98.6 F (37 C), temperature source Oral, height 5\' 5"  (1.651 m), weight 211 lb (95.7 kg), SpO2 98 %. Body mass index is 35.11 kg/m.  General: Cooperative, alert, well developed, in no acute distress. HEENT: Conjunctivae and lids unremarkable. Cardiovascular: Regular rhythm.  Lungs: Normal work of breathing. Neurologic: No focal deficits.   Lab Results  Component Value Date   CREATININE 0.80 10/16/2019   BUN 15 10/16/2019   NA 141 10/16/2019   K 3.5 10/16/2019   CL 102 10/16/2019   CO2 24 10/16/2019   Lab Results  Component Value Date   ALT 14 10/16/2019   AST 16 10/16/2019   ALKPHOS 57 10/16/2019   BILITOT 0.3 10/16/2019   Lab Results  Component Value Date   HGBA1C 5.9 (H) 10/16/2019   Lab Results  Component Value Date   INSULIN 10.1 10/16/2019   Lab Results  Component Value Date   TSH 1.610 10/16/2019   Lab Results  Component Value Date   CHOL 191 10/16/2019   HDL 50 10/16/2019   LDLCALC 131 (H) 10/16/2019   TRIG  55 10/16/2019   Lab Results  Component Value Date   WBC 5.0 10/16/2019   HGB 13.7 10/16/2019   HCT 40.6 10/16/2019   MCV 85 10/16/2019   PLT 220 10/16/2019   Lab Results  Component Value Date   IRON 45 10/16/2019   TIBC 237 (L) 10/16/2019   FERRITIN 241 (H) 10/16/2019   Attestation Statements:   Reviewed by clinician on day of visit: allergies, medications, problem list, medical history, surgical history,  family history, social history, and previous encounter notes.  I, Water quality scientist, CMA, am acting as transcriptionist for Briscoe Deutscher, DO  I have reviewed the above documentation for accuracy and completeness, and I agree with the above. Briscoe Deutscher, DO

## 2020-06-01 ENCOUNTER — Encounter: Payer: Self-pay | Admitting: Allergy and Immunology

## 2020-06-01 ENCOUNTER — Ambulatory Visit: Payer: BC Managed Care – PPO | Admitting: Allergy and Immunology

## 2020-06-01 ENCOUNTER — Other Ambulatory Visit: Payer: Self-pay

## 2020-06-01 VITALS — BP 128/80 | HR 91 | Temp 98.0°F | Resp 14 | Ht 64.0 in | Wt 214.8 lb

## 2020-06-01 DIAGNOSIS — J3089 Other allergic rhinitis: Secondary | ICD-10-CM | POA: Diagnosis not present

## 2020-06-01 DIAGNOSIS — T63481D Toxic effect of venom of other arthropod, accidental (unintentional), subsequent encounter: Secondary | ICD-10-CM

## 2020-06-01 DIAGNOSIS — D7219 Other eosinophilia: Secondary | ICD-10-CM | POA: Diagnosis not present

## 2020-06-01 DIAGNOSIS — J4550 Severe persistent asthma, uncomplicated: Secondary | ICD-10-CM

## 2020-06-01 DIAGNOSIS — T782XXD Anaphylactic shock, unspecified, subsequent encounter: Secondary | ICD-10-CM

## 2020-06-01 NOTE — Patient Instructions (Addendum)
  1. Continue to Treat and prevent inflammation:   A. Advair 230 - 2 inhalations 1-2 times a day depending on asthma activity  B. Benralizumab injections every 8 weeks   2. If needed:   A. ProAir HFA or similar 2 inhalations every 4-6 hours  B. nasal saline  C. OTC antihistamine  D. Epi-Pen  3. Return to clinic in 6 months or earlier if problem.  4.  Obtain fall flu vaccine

## 2020-06-01 NOTE — Progress Notes (Signed)
[place] [facility] [place] [facility] [place]   Follow-up Note  Referring Provider: Jerrol Banana.,* Primary Provider: Jerrol Banana., MD Date of Office Visit: 06/01/2020  Subjective:   Gabrielle Terry (DOB: Jul 26, 1960) is a 59 y.o. 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  Her last visit to this clinic was 01 Dec 2019.  Once again she has had an excellent interval of time regarding her eosinophilic driven respiratory tract disease without the need for systemic steroid or antibiotic and no limitation on ability to exercise and very little need for short acting bronchodilator while she continues on benralizumab injections every 8 weeks and Advair 230 consistently.  In addition she is very careful about hymenoptera venom exposure and continues to carry an injectable epinephrine device.  She has had 3 exposures to Covid proteins with a native infection in January 2021 and 2 Pfizer Covid vaccines.  Allergies as of 06/01/2020   No Known Allergies     Medication List      Advair HFA 230-21 MCG/ACT inhaler Generic drug: fluticasone-salmeterol Inhale 2 puffs into the lungs 2 (two) times daily.   albuterol 108 (90 Base) MCG/ACT inhaler Commonly known as: VENTOLIN HFA Inhale 2 puffs into the lungs every 6 (six) hours as needed for wheezing or shortness of breath.   amLODipine 2.5 MG tablet Commonly known as: NORVASC TAKE 1 TABLET(2.5 MG) BY MOUTH DAILY   EPINEPHrine 0.3 mg/0.3 mL Soaj injection Commonly known as: Auvi-Q Use as directed for life-threatening allergic reaction.   estradiol 1 MG tablet Commonly known as: ESTRACE Take 1 mg daily by mouth.   Fasenra Pen 30 MG/ML Soaj Generic drug:  Benralizumab   losartan-hydrochlorothiazide 100-25 MG tablet Commonly known as: HYZAAR TAKE 1 TABLET BY MOUTH DAILY   lovastatin 20 MG tablet Commonly known as: MEVACOR TAKE 1 TABLET(20 MG) BY MOUTH DAILY   metFORMIN 500 MG tablet Commonly known as: GLUCOPHAGE Take 1 tablet (500 mg total) by mouth 2 (two) times daily with a meal.   phentermine 37.5 MG tablet Commonly known as: ADIPEX-P Take 0.5 tablets (18.75 mg total) by mouth daily before breakfast.   polyethylene glycol 17 g packet Commonly known as: MIRALAX / GLYCOLAX Take 17 g by mouth daily.       Past Medical History:  Diagnosis Date  . Arthritis    knees  . Asthma 2009  . Breast hematoma 2011   Right  . Colon polyp 2013   3  . Constipation   . Cough    from sinus drainage  . COVID-19 virus infection 07/2019  . Diffuse cystic mastopathy   . Foot cramps   . GERD (gastroesophageal reflux disease)   . High cholesterol   . Hypercholesteremia   . Hypertension   . Ulcer     Past Surgical History:  Procedure Laterality Date  . ABDOMINAL HYSTERECTOMY  1997  . BREAST SURGERY Right 2011   hematoma  . [place]  . COLONOSCOPY  2013   3 polyps/ Dr Jamal Collin  . COLONOSCOPY WITH PROPOFOL N/A 03/21/2017   Procedure: COLONOSCOPY WITH PROPOFOL;  Surgeon: Christene Lye, MD;  Location: [facility] ENDOSCOPY;  Service: Endoscopy;  Laterality: N/A;  . ESOPHAGOGASTRODUODENOSCOPY (EGD) WITH PROPOFOL N/A  01/26/2016   Procedure: ESOPHAGOGASTRODUODENOSCOPY (EGD) WITH PROPOFOL;  Surgeon: Manya Silvas, MD;  Location: [facility] ENDOSCOPY;  Service: Endoscopy;  Laterality: N/A;  . ETHMOIDECTOMY Bilateral 04/29/2015   Procedure: TOTAL ETHMOIDECTOMY;  Surgeon: Margaretha Sheffield, MD;  Location: [place];  Service: ENT;  Laterality: Bilateral;  . FRONTAL SINUS EXPLORATION Bilateral 04/29/2015   Procedure: FRONTAL SINUS EXPLORATION;  Surgeon: Margaretha Sheffield, MD;  Location: [place];  Service: ENT;   Laterality: Bilateral;  . IMAGE GUIDED SINUS SURGERY N/A 04/29/2015   Procedure: IMAGE GUIDED SINUS SURGERY;  Surgeon: Margaretha Sheffield, MD;  Location: [place];  Service: ENT;  Laterality: N/A;  GAVE DISK TO CE CE  . MAXILLARY ANTROSTOMY Bilateral 04/29/2015   Procedure: MAXILLARY ANTROSTOMY WITH REMOVAL OF CONTENTS;  Surgeon: Margaretha Sheffield, MD;  Location: [place];  Service: ENT;  Laterality: Bilateral;  . MYOMECTOMY  1991  . NASAL SEPTUM SURGERY  2013  . salpingo oophorectmy  1997  . SPHENOIDECTOMY Bilateral 04/29/2015   Procedure: SPHENOIDECTOMY;  Surgeon: Margaretha Sheffield, MD;  Location: [place];  Service: ENT;  Laterality: Bilateral;  . TUBAL LIGATION    . UTERINE FIBROID SURGERY     removed    Review of systems negative except as noted in HPI / PMHx or noted below:  Review of Systems  Constitutional: Negative.   HENT: Negative.   Eyes: Negative.   Respiratory: Negative.   Cardiovascular: Negative.   Gastrointestinal: Negative.   Genitourinary: Negative.   Musculoskeletal: Negative.   Skin: Negative.   Neurological: Negative.   Endo/Heme/Allergies: Negative.   Psychiatric/Behavioral: Negative.      Objective:   Vitals:   06/01/20 1618  BP: 128/80  Pulse: 91  Resp: 14  Temp: 98 F (36.7 C)  SpO2: 98%   Height: 5\' 4"  (162.6 cm)  Weight: 214 lb 12.8 oz (97.4 kg)   Physical Exam Constitutional:      Appearance: She is not diaphoretic.  HENT:     Head: Normocephalic.     Right Ear: Tympanic membrane, ear canal and external ear normal.     Left Ear: Tympanic membrane, ear canal and external ear normal.     Nose: Nose normal. No mucosal edema or rhinorrhea.     Mouth/Throat:     Pharynx: Uvula midline. No oropharyngeal exudate.  Eyes:     Conjunctiva/sclera: Conjunctivae normal.  Neck:     Thyroid: No thyromegaly.     Trachea: Trachea normal. No tracheal tenderness or tracheal deviation.  Cardiovascular:     Rate and Rhythm: Normal  rate and regular rhythm.     Heart sounds: Normal heart sounds, S1 normal and S2 normal. No murmur heard.   Pulmonary:     Effort: No respiratory distress.     Breath sounds: Normal breath sounds. No stridor. No wheezing or rales.  Lymphadenopathy:     Head:     Right side of head: No tonsillar adenopathy.     Left side of head: No tonsillar adenopathy.     Cervical: No cervical adenopathy.  Skin:    Findings: No erythema or rash.     Nails: There is no clubbing.  Neurological:     Mental Status: She is alert.     Diagnostics:    Spirometry was performed and demonstrated an FEV1 of 2.59 at 120 % of predicted.   Assessment and Plan:   1. Asthma, severe persistent, well-controlled   2. Other allergic rhinitis   3. Anaphylaxis due  to hymenoptera venom, accidental or unintentional, subsequent encounter   4. Other eosinophilia     1. Continue to Treat and prevent inflammation:   A. Advair 230 - 2 inhalations 1-2 times a day depending on asthma activity  B. Benralizumab injections every 8 weeks   2. If needed:   A. ProAir HFA or similar 2 inhalations every 4-6 hours  B. nasal saline  C. OTC antihistamine  D. Epi-Pen  3. Return to clinic in 6 months or earlier if problem.  4.  Obtain fall flu vaccine   Ravan appears to be doing very well and I have encouraged her to decrease her dose of Advair to just 1 time per day while she continues on benralizumab as it is quite obvious that benralizumab injections have led to a dramatic improvement in regard to her respiratory tract disease.  Assuming she does well on this plan I will see her back in his clinic in 6 months or earlier if there is a problem.  Allena Katz, MD Allergy / Immunology [place]

## 2020-06-02 ENCOUNTER — Encounter: Payer: Self-pay | Admitting: Allergy and Immunology

## 2020-06-14 ENCOUNTER — Encounter (INDEPENDENT_AMBULATORY_CARE_PROVIDER_SITE_OTHER): Payer: Self-pay | Admitting: Family Medicine

## 2020-06-14 ENCOUNTER — Other Ambulatory Visit: Payer: Self-pay

## 2020-06-14 ENCOUNTER — Ambulatory Visit (INDEPENDENT_AMBULATORY_CARE_PROVIDER_SITE_OTHER): Payer: BC Managed Care – PPO | Admitting: Family Medicine

## 2020-06-14 VITALS — BP 133/82 | HR 75 | Temp 98.3°F | Ht 64.0 in | Wt 207.0 lb

## 2020-06-14 DIAGNOSIS — R632 Polyphagia: Secondary | ICD-10-CM

## 2020-06-14 DIAGNOSIS — I10 Essential (primary) hypertension: Secondary | ICD-10-CM | POA: Diagnosis not present

## 2020-06-14 DIAGNOSIS — R7303 Prediabetes: Secondary | ICD-10-CM | POA: Diagnosis not present

## 2020-06-14 DIAGNOSIS — Z6835 Body mass index (BMI) 35.0-35.9, adult: Secondary | ICD-10-CM

## 2020-06-16 NOTE — Progress Notes (Signed)
Chief Complaint:   OBESITY Gabrielle Terry is here to discuss her progress with her obesity treatment plan along with follow-up of her obesity related diagnoses.   Today's visit was #: 11 Starting weight: 209 lbs Starting date: 10/16/2019 Today's weight: 207 lbs Today's date: 06/14/2020 Total lbs lost to date: 2 lbs Body mass index is 35.53 kg/m.  Total weight loss percentage to date: -0.96%  Interim History: Gabrielle Terry says she is happy with her progress.  We reviewed mindfulness, Thanksgiving, increasing water intake, and portion control. Nutrition Plan: the Category 1 Plan for 40% of the time.  Anti-obesity medications: phentermine, metformin.  Activity: Walking/cardio/strength for 60 minutes 3-4 times per week.  Assessment/Plan:   1. Polyphagia Controlled. She will continue to focus on protein-rich, low simple carbohydrate foods. We reviewed the importance of hydration, regular exercise for stress reduction, and restorative sleep.  She is taking phentermine.  Plan:  Continue phentermine.  Patient understands that long-term use of phentermine is considered off-label use of this medication, however, that the Endocrine Society and recent research supports that long-term use of phentermine does not appear to have detrimental health effects when used in the appropriate patient. In addition, a 2019 study published in Obesity Journal on 13,972 patients concluded that "recommendations to limit phentermine to less than 3 months do not align with current concepts of pharmacologic treatment of obesity", and that "long term phentermine users experience greater weight loss without apparent increases in cardiovascular risk".  2. Prediabetes Improving, but not optimized. Goal is HgbA1c < 5.7.  Medication: metformin.  She will continue to focus on protein-rich, low simple carbohydrate foods. We reviewed the importance of hydration, regular exercise for stress reduction, and restorative sleep.   Plan:   Continue medication.  Lab Results  Component Value Date   HGBA1C 5.9 (H) 10/16/2019   Lab Results  Component Value Date   INSULIN 10.1 10/16/2019   3. Essential hypertension Continues to be controlled.  Medications: Norvasc, Hyzaar.   Plan: Avoid buying foods that are: processed, frozen, or prepackaged to avoid excess salt. We will continue to monitor symptoms as they relate to her weight loss journey.  BP Readings from Last 3 Encounters:  06/14/20 133/82  06/01/20 128/80  05/24/20 131/86   Lab Results  Component Value Date   CREATININE 0.80 10/16/2019   4. Class 2 severe obesity with serious comorbidity and body mass index (BMI) of 35.0 to 35.9 in adult, unspecified obesity type ([place])  Course: Gabrielle Terry is currently in the action stage of change. As such, her goal is to continue with weight loss efforts.   Nutrition goals: She has agreed to the Category 1 Plan.   Exercise goals: For substantial health benefits, adults should do at least 150 minutes (2 hours and 30 minutes) a week of moderate-intensity, or 75 minutes (1 hour and 15 minutes) a week of vigorous-intensity aerobic physical activity, or an equivalent combination of moderate- and vigorous-intensity aerobic activity. Aerobic activity should be performed in episodes of at least 10 minutes, and preferably, it should be spread throughout the week.  Behavioral modification strategies: increasing lean protein intake, decreasing simple carbohydrates, increasing vegetables and increasing water intake.  [name] has agreed to follow-up with our clinic in 3-4 weeks. She was informed of the importance of frequent follow-up visits to maximize her success with intensive lifestyle modifications for her multiple health conditions.   Objective:   Blood pressure 133/82, pulse 75, temperature 98.3 F (36.8 C), temperature source Oral, height 5'  4" (1.626 m), weight 207 lb (93.9 kg), SpO2 99 %. Body mass index is 35.53 kg/m.  General:  Cooperative, alert, well developed, in no acute distress. HEENT: Conjunctivae and lids unremarkable. Cardiovascular: Regular rhythm.  Lungs: Normal work of breathing. Neurologic: No focal deficits.   Lab Results  Component Value Date   CREATININE 0.80 10/16/2019   BUN 15 10/16/2019   NA 141 10/16/2019   K 3.5 10/16/2019   CL 102 10/16/2019   CO2 24 10/16/2019   Lab Results  Component Value Date   ALT 14 10/16/2019   AST 16 10/16/2019   ALKPHOS 57 10/16/2019   BILITOT 0.3 10/16/2019   Lab Results  Component Value Date   HGBA1C 5.9 (H) 10/16/2019   Lab Results  Component Value Date   INSULIN 10.1 10/16/2019   Lab Results  Component Value Date   TSH 1.610 10/16/2019   Lab Results  Component Value Date   CHOL 191 10/16/2019   HDL 50 10/16/2019   LDLCALC 131 (H) 10/16/2019   TRIG 55 10/16/2019   Lab Results  Component Value Date   WBC 5.0 10/16/2019   HGB 13.7 10/16/2019   HCT 40.6 10/16/2019   MCV 85 10/16/2019   PLT 220 10/16/2019   Lab Results  Component Value Date   IRON 45 10/16/2019   TIBC 237 (L) 10/16/2019   FERRITIN 241 (H) 10/16/2019   Attestation Statements:   Reviewed by clinician on day of visit: allergies, medications, problem list, medical history, surgical history, family history, social history, and previous encounter notes.  Time spent on visit including pre-visit chart review and post-visit care and charting was 25 minutes.   I, Water quality scientist, CMA, am acting as transcriptionist for Briscoe Deutscher, DO  I have reviewed the above documentation for accuracy and completeness, and I agree with the above. Briscoe Deutscher, DO

## 2020-06-22 ENCOUNTER — Other Ambulatory Visit: Payer: Self-pay

## 2020-06-22 ENCOUNTER — Ambulatory Visit (INDEPENDENT_AMBULATORY_CARE_PROVIDER_SITE_OTHER): Payer: BC Managed Care – PPO

## 2020-06-22 DIAGNOSIS — J4550 Severe persistent asthma, uncomplicated: Secondary | ICD-10-CM | POA: Diagnosis not present

## 2020-06-24 DIAGNOSIS — K581 Irritable bowel syndrome with constipation: Secondary | ICD-10-CM | POA: Diagnosis not present

## 2020-06-24 DIAGNOSIS — D126 Benign neoplasm of colon, unspecified: Secondary | ICD-10-CM | POA: Diagnosis not present

## 2020-07-04 ENCOUNTER — Other Ambulatory Visit (INDEPENDENT_AMBULATORY_CARE_PROVIDER_SITE_OTHER): Payer: Self-pay | Admitting: Family Medicine

## 2020-07-04 DIAGNOSIS — R7303 Prediabetes: Secondary | ICD-10-CM

## 2020-07-05 ENCOUNTER — Encounter (INDEPENDENT_AMBULATORY_CARE_PROVIDER_SITE_OTHER): Payer: Self-pay

## 2020-07-05 NOTE — Telephone Encounter (Signed)
Message sent to pt.

## 2020-07-05 NOTE — Telephone Encounter (Signed)
Dr.Wallace °

## 2020-07-08 DIAGNOSIS — Z20822 Contact with and (suspected) exposure to covid-19: Secondary | ICD-10-CM | POA: Diagnosis not present

## 2020-07-12 ENCOUNTER — Encounter (INDEPENDENT_AMBULATORY_CARE_PROVIDER_SITE_OTHER): Payer: Self-pay | Admitting: Family Medicine

## 2020-07-12 ENCOUNTER — Other Ambulatory Visit: Payer: Self-pay

## 2020-07-12 ENCOUNTER — Ambulatory Visit (INDEPENDENT_AMBULATORY_CARE_PROVIDER_SITE_OTHER): Payer: BC Managed Care – PPO | Admitting: Family Medicine

## 2020-07-12 VITALS — BP 135/82 | HR 70 | Temp 98.3°F | Ht 64.0 in | Wt 208.0 lb

## 2020-07-12 DIAGNOSIS — Z6835 Body mass index (BMI) 35.0-35.9, adult: Secondary | ICD-10-CM

## 2020-07-12 DIAGNOSIS — R632 Polyphagia: Secondary | ICD-10-CM | POA: Diagnosis not present

## 2020-07-12 DIAGNOSIS — Z9189 Other specified personal risk factors, not elsewhere classified: Secondary | ICD-10-CM

## 2020-07-12 DIAGNOSIS — M25561 Pain in right knee: Secondary | ICD-10-CM | POA: Diagnosis not present

## 2020-07-12 DIAGNOSIS — R7303 Prediabetes: Secondary | ICD-10-CM | POA: Diagnosis not present

## 2020-07-12 DIAGNOSIS — I10 Essential (primary) hypertension: Secondary | ICD-10-CM | POA: Diagnosis not present

## 2020-07-13 ENCOUNTER — Encounter (INDEPENDENT_AMBULATORY_CARE_PROVIDER_SITE_OTHER): Payer: Self-pay | Admitting: Family Medicine

## 2020-07-13 MED ORDER — PHENTERMINE HCL 37.5 MG PO TABS: 18.7500 mg | ORAL_TABLET | Freq: Every day | ORAL | 0 refills | Status: DC

## 2020-07-13 MED ORDER — METFORMIN HCL 500 MG PO TABS: 500.0000 mg | ORAL_TABLET | Freq: Two times a day (BID) | ORAL | 0 refills | Status: DC

## 2020-07-13 NOTE — Progress Notes (Signed)
Chief Complaint:   OBESITY Gabrielle Terry is here to discuss her progress with her obesity treatment plan along with follow-up of her obesity related diagnoses.   Today's visit was #: 12 Starting weight: 209 lbs Starting date: 10/16/2019 Today's weight: 208 lbs Today's date: 07/12/2020 Total lbs lost to date: 1 lb Body mass index is 35.7 kg/m.  Total weight loss percentage to date: -0.48%  Interim History: Gabrielle Terry has a [age] coming up.  She says that her right knee pain is worsening and she will call Orthopedics.  She is afraid of needles, so she has never had a cortisone injection. Nutrition Plan: the Category 1 Plan for 75% of the time.  Anti-obesity medications: phentermine 18.75 mg daily. Reported side effects: None. Hunger is well controlled. Cravings are well controlled.  Activity: Cardio/strength training for 30-60 minutes 3 times per week.  Assessment/Plan:   1. Prediabetes Not at goal. Goal is HgbA1c < 5.7.  Medication: metformin 500 mg twice daily.  She will continue to focus on protein-rich, low simple carbohydrate foods. We reviewed the importance of hydration, regular exercise for stress reduction, and restorative sleep.   Lab Results  Component Value Date   HGBA1C 5.9 (H) 10/16/2019   Lab Results  Component Value Date   INSULIN 10.1 10/16/2019   - Refill metFORMIN (GLUCOPHAGE) 500 MG tablet; Take 1 tablet (500 mg total) by mouth 2 (two) times daily with a meal.  Dispense: 60 tablet; Refill: 0  2. Polyphagia Improved with treatment. She will continue to focus on protein-rich, low simple carbohydrate foods. We reviewed the importance of hydration, regular exercise for stress reduction, and restorative sleep.  This patient 1) has no evidence of serious cardiovascular disease; 2) does not have serious psychiatric disease or a history of substance abuse; 3) has been informed about weight loss medications that are FDA-approved for long term use and told that these  have been documented to be safe and effective; 4) does not demonstrate a clinically significant increase in pulse or BP when taking phentermine; and 5) demonstrates significant weight loss while using the medication. Patient understands that all anti-obesity medications are contraindicated in pregnancy. Pt denies a history of glaucoma.   Patient understands that long-term use of phentermine is considered off-label use of this medication, however, that the Endocrine Society and recent research supports that long-term use of phentermine does not appear to have detrimental health effects when used in the appropriate patient. In addition, a 2019 study published in Obesity Journal on 13,972 patients concluded that "recommendations to limit phentermine to less than 3 months do not align with current concepts of pharmacologic treatment of obesity", and that "long term phentermine users experience greater weight loss without apparent increases in cardiovascular risk".  The potential risks and benefits of phentermine have been reviewed with the patient, and alternative treatment options were discussed. All questions were answered, and the patient wishes to move forward with this medication.   I have consulted the [place] Controlled Substances Registry for this patient, and feel the risk/benefit ratio today is favorable for proceeding with this prescription for a controlled substance. The patient understands monitoring parameters and red flags.   - Refill phentermine (ADIPEX-P) 37.5 MG tablet; Take 0.5 tablets (18.75 mg total) by mouth daily before breakfast.  Dispense: 30 tablet; Refill: 0  3. Essential hypertension At goal. Medications: Norvasc 2.5 mg daily, Hyzaar 100-25 mg daily.   Plan: Avoid buying foods that are: processed, frozen, or prepackaged to avoid excess salt.  We will continue to monitor symptoms as they relate to her weight loss journey.  BP Readings from Last 3 Encounters:  07/12/20 135/82   06/14/20 133/82  06/01/20 128/80   Lab Results  Component Value Date   CREATININE 0.80 10/16/2019   4. Right knee pain, unspecified chronicity Gabrielle Terry has primary osteoarthritis.  She says she will call Orthopedics.  5. At risk for activity intolerance Gabrielle Terry was given approximately 15 minutes of exercise intolerance counseling today. She is 59 y.o. female and has risk factors exercise intolerance including obesity and knee pain. We discussed intensive lifestyle modifications today with an emphasis on specific weight loss instructions and strategies. I encouraged her to consider corticosteroid knee injections for pain relief.   6. Class 2 severe obesity with serious comorbidity and body mass index (BMI) of 35.0 to 35.9 in adult, unspecified obesity type ([place])  Course: Gabrielle Terry is currently in the action stage of change. As such, her goal is to continue with weight loss efforts.   Nutrition goals: She has agreed to the Category 1 Plan.   Exercise goals: For substantial health benefits, adults should do at least 150 minutes (2 hours and 30 minutes) a week of moderate-intensity, or 75 minutes (1 hour and 15 minutes) a week of vigorous-intensity aerobic physical activity, or an equivalent combination of moderate- and vigorous-intensity aerobic activity. Aerobic activity should be performed in episodes of at least 10 minutes, and preferably, it should be spread throughout the week.  Behavioral modification strategies: increasing lean protein intake, decreasing simple carbohydrates, increasing vegetables and increasing water intake.  Gabrielle Terry has agreed to follow-up with our clinic in 4 weeks. She was informed of the importance of frequent follow-up visits to maximize her success with intensive lifestyle modifications for her multiple health conditions.   Objective:   Blood pressure 135/82, pulse 70, temperature 98.3 F (36.8 C), temperature source Oral, height 5\' 4"  (1.626 m), weight 208 lb (94.3  kg), SpO2 97 %. Body mass index is 35.7 kg/m.  General: Cooperative, alert, well developed, in no acute distress. HEENT: Conjunctivae and lids unremarkable. Cardiovascular: Regular rhythm.  Lungs: Normal work of breathing. Neurologic: No focal deficits.   Lab Results  Component Value Date   CREATININE 0.80 10/16/2019   BUN 15 10/16/2019   NA 141 10/16/2019   K 3.5 10/16/2019   CL 102 10/16/2019   CO2 24 10/16/2019   Lab Results  Component Value Date   ALT 14 10/16/2019   AST 16 10/16/2019   ALKPHOS 57 10/16/2019   BILITOT 0.3 10/16/2019   Lab Results  Component Value Date   HGBA1C 5.9 (H) 10/16/2019   Lab Results  Component Value Date   INSULIN 10.1 10/16/2019   Lab Results  Component Value Date   TSH 1.610 10/16/2019   Lab Results  Component Value Date   CHOL 191 10/16/2019   HDL 50 10/16/2019   LDLCALC 131 (H) 10/16/2019   TRIG 55 10/16/2019   Lab Results  Component Value Date   WBC 5.0 10/16/2019   HGB 13.7 10/16/2019   HCT 40.6 10/16/2019   MCV 85 10/16/2019   PLT 220 10/16/2019   Lab Results  Component Value Date   IRON 45 10/16/2019   TIBC 237 (L) 10/16/2019   FERRITIN 241 (H) 10/16/2019   Attestation Statements:   Reviewed by clinician on day of visit: allergies, medications, problem list, medical history, surgical history, family history, social history, and previous encounter notes.  I, Water quality scientist, CMA, am acting  as transcriptionist for Briscoe Deutscher, DO  I have reviewed the above documentation for accuracy and completeness, and I agree with the above. Briscoe Deutscher, DO

## 2020-07-20 ENCOUNTER — Ambulatory Visit: Payer: BC Managed Care – PPO | Admitting: Family Medicine

## 2020-07-27 ENCOUNTER — Ambulatory Visit: Payer: BC Managed Care – PPO | Admitting: Family Medicine

## 2020-07-27 DIAGNOSIS — M25562 Pain in left knee: Secondary | ICD-10-CM | POA: Diagnosis not present

## 2020-07-28 ENCOUNTER — Telehealth: Payer: Self-pay

## 2020-07-28 NOTE — Telephone Encounter (Signed)
Did you want the patient to be on Losartan? Please advise. Thanks!

## 2020-07-28 NOTE — Telephone Encounter (Signed)
[facility] faxed refill request for the following medications:  losartan (COZAAR) 25 MG tablet  I do not see this on current medication list.  Please advise.

## 2020-08-02 ENCOUNTER — Other Ambulatory Visit: Payer: Self-pay | Admitting: Family Medicine

## 2020-08-02 DIAGNOSIS — I10 Essential (primary) hypertension: Secondary | ICD-10-CM

## 2020-08-02 MED ORDER — LOSARTAN POTASSIUM-HCTZ 100-25 MG PO TABS: 1.0000 | ORAL_TABLET | Freq: Every day | ORAL | 1 refills | Status: DC

## 2020-08-02 NOTE — Telephone Encounter (Signed)
Copied from [place] [id number]. Topic: Quick Communication - Rx Refill/Question >> Aug 02, 2020  2:30 PM Tessa Lerner A wrote: Medication: losartan-hydrochlorothiazide Konrad Penta)   Has the patient contacted their pharmacy? - Yes [facility] is awaiting verification from PCP   Preferred Pharmacy (with phone number or street name): [facility] [address], [place] [address] Phone [phone], ext. [phone]  Agent: Please be advised that RX refills may take up to 3 business days. We ask that you follow-up with your pharmacy.

## 2020-08-11 ENCOUNTER — Encounter (INDEPENDENT_AMBULATORY_CARE_PROVIDER_SITE_OTHER): Payer: Self-pay

## 2020-08-11 ENCOUNTER — Other Ambulatory Visit (INDEPENDENT_AMBULATORY_CARE_PROVIDER_SITE_OTHER): Payer: Self-pay | Admitting: Family Medicine

## 2020-08-11 DIAGNOSIS — R7303 Prediabetes: Secondary | ICD-10-CM

## 2020-08-11 NOTE — Telephone Encounter (Signed)
Message sent to pt.

## 2020-08-16 ENCOUNTER — Ambulatory Visit (INDEPENDENT_AMBULATORY_CARE_PROVIDER_SITE_OTHER): Payer: BC Managed Care – PPO | Admitting: Family Medicine

## 2020-08-17 ENCOUNTER — Other Ambulatory Visit: Payer: Self-pay | Admitting: *Deleted

## 2020-08-17 ENCOUNTER — Ambulatory Visit: Payer: Self-pay

## 2020-08-17 DIAGNOSIS — J45909 Unspecified asthma, uncomplicated: Secondary | ICD-10-CM

## 2020-08-17 MED ORDER — FASENRA PEN 30 MG/ML ~~LOC~~ SOAJ: 30.0000 mg | SUBCUTANEOUS | 8 refills | Status: DC

## 2020-08-19 ENCOUNTER — Ambulatory Visit: Payer: Self-pay

## 2020-08-19 ENCOUNTER — Ambulatory Visit (INDEPENDENT_AMBULATORY_CARE_PROVIDER_SITE_OTHER): Payer: BC Managed Care – PPO | Admitting: Family Medicine

## 2020-08-19 ENCOUNTER — Other Ambulatory Visit: Payer: Self-pay

## 2020-08-19 ENCOUNTER — Encounter (INDEPENDENT_AMBULATORY_CARE_PROVIDER_SITE_OTHER): Payer: Self-pay | Admitting: Family Medicine

## 2020-08-19 VITALS — BP 136/81 | HR 72 | Temp 98.0°F | Ht 64.0 in | Wt 212.0 lb

## 2020-08-19 DIAGNOSIS — Z9189 Other specified personal risk factors, not elsewhere classified: Secondary | ICD-10-CM | POA: Diagnosis not present

## 2020-08-19 DIAGNOSIS — I10 Essential (primary) hypertension: Secondary | ICD-10-CM

## 2020-08-19 DIAGNOSIS — R7303 Prediabetes: Secondary | ICD-10-CM | POA: Diagnosis not present

## 2020-08-19 DIAGNOSIS — Z6836 Body mass index (BMI) 36.0-36.9, adult: Secondary | ICD-10-CM

## 2020-08-19 DIAGNOSIS — J45909 Unspecified asthma, uncomplicated: Secondary | ICD-10-CM | POA: Diagnosis not present

## 2020-08-19 DIAGNOSIS — R632 Polyphagia: Secondary | ICD-10-CM | POA: Diagnosis not present

## 2020-08-19 MED ORDER — METFORMIN HCL 500 MG PO TABS: 500.0000 mg | ORAL_TABLET | Freq: Two times a day (BID) | ORAL | 0 refills | Status: DC

## 2020-08-23 NOTE — Progress Notes (Signed)
Chief Complaint:   OBESITY Gabrielle Terry is here to discuss her progress with her obesity treatment plan along with follow-up of her obesity related diagnoses.   Today's visit was #: 13 Starting weight: 209 lbs Starting date: 10/16/2019 Today's weight: 212 lbs Today's date: 08/19/2020 Total lbs lost to date: 0 Body mass index is 36.39 kg/m.   Interim History: [name] and I discussed treatment for chronic constipation.  Nutrition Plan: the Category 1 Plan for 0% of the time.  Anti-obesity medications: phentermine 18.75 mg daily. Reported side effects: None. Activity: Walking/strength training for 30 minutes 4 times per week.  Assessment/Plan:   1. Prediabetes Goal is HgbA1c < 5.7.  Medication: metformin 500 mg twice daily.  She will continue to focus on protein-rich, low simple carbohydrate foods. We reviewed the importance of hydration, regular exercise for stress reduction, and restorative sleep.   Lab Results  Component Value Date   HGBA1C 5.9 (H) 10/16/2019   Lab Results  Component Value Date   INSULIN 10.1 10/16/2019   - Refill metFORMIN (GLUCOPHAGE) 500 MG tablet; Take 1 tablet (500 mg total) by mouth 2 (two) times daily with a meal.  Dispense: 60 tablet; Refill: 0  2. Essential hypertension At goal. Medications: Norvasc 2.5 mg daily, Hyzaar 100/25 mg daily.   Plan: Avoid buying foods that are: processed, frozen, or prepackaged to avoid excess salt. We will continue to monitor symptoms as they relate to her weight loss journey.  BP Readings from Last 3 Encounters:  08/19/20 136/81  07/12/20 135/82  06/14/20 133/82   Lab Results  Component Value Date   CREATININE 0.80 10/16/2019   3. Polyphagia Controlled.  Hyperphagia, also called polyphagia, refers to excessive feelings of hunger. This is more likely to be an issues for people that have diabetes, prediabetes, or insulin resistance. She will continue to focus on protein-rich, low simple carbohydrate foods. We  reviewed the importance of hydration, regular exercise for stress reduction, and restorative sleep.  She is taking phentermine 18.75 mg daily.  4. Asthma, unspecified asthma severity, unspecified whether complicated, unspecified whether persistent Stable.  She takes Advair and Ventolin for her asthma.  5. At risk for heart disease Due to Lometa's current state of health and medical condition(s), she is at a higher risk for heart disease.   This puts the patient at much greater risk to subsequently develop cardiopulmonary conditions that can significantly affect patient's quality of life in a negative manner as well.    At least 8 minutes was spent on counseling Tiwanna about these concerns today. Initial goal is to lose at least 5-10% of starting weight to help reduce these risk factors.  We will continue to reassess these conditions on a fairly regular basis in an attempt to decrease patient's overall morbidity and mortality.  Evidence-based interventions for health behavior change were utilized today including the discussion of self monitoring techniques, problem-solving barriers and SMART goal setting techniques.  Specifically regarding patient's less desirable eating habits and patterns, we employed the technique of small changes when Lennix has not been able to fully commit to her prudent nutritional plan.  6. Class 2 severe obesity with serious comorbidity and body mass index (BMI) of 36.0 to 36.9 in adult, unspecified obesity type ([place])  Course: [name] is currently in the action stage of change. As such, her goal is to continue with weight loss efforts.   Nutrition goals: She has agreed to the Category 1 Plan.   Exercise goals: For substantial  health benefits, adults should do at least 150 minutes (2 hours and 30 minutes) a week of moderate-intensity, or 75 minutes (1 hour and 15 minutes) a week of vigorous-intensity aerobic physical activity, or an equivalent combination of moderate- and  vigorous-intensity aerobic activity. Aerobic activity should be performed in episodes of at least 10 minutes, and preferably, it should be spread throughout the week.  Behavioral modification strategies: increasing lean protein intake, decreasing simple carbohydrates, increasing vegetables, increasing water intake and decreasing liquid calories.  Quyen has agreed to follow-up with our clinic in 3 weeks. She was informed of the importance of frequent follow-up visits to maximize her success with intensive lifestyle modifications for her multiple health conditions.   Objective:   Blood pressure 136/81, pulse 72, temperature 98 F (36.7 C), temperature source Oral, height 5\' 4"  (1.626 m), weight 212 lb (96.2 kg), SpO2 99 %. Body mass index is 36.39 kg/m.  General: Cooperative, alert, well developed, in no acute distress. HEENT: Conjunctivae and lids unremarkable. Cardiovascular: Regular rhythm.  Lungs: Normal work of breathing. Neurologic: No focal deficits.   Lab Results  Component Value Date   CREATININE 0.80 10/16/2019   BUN 15 10/16/2019   NA 141 10/16/2019   K 3.5 10/16/2019   CL 102 10/16/2019   CO2 24 10/16/2019   Lab Results  Component Value Date   ALT 14 10/16/2019   AST 16 10/16/2019   ALKPHOS 57 10/16/2019   BILITOT 0.3 10/16/2019   Lab Results  Component Value Date   HGBA1C 5.9 (H) 10/16/2019   Lab Results  Component Value Date   INSULIN 10.1 10/16/2019   Lab Results  Component Value Date   TSH 1.610 10/16/2019   Lab Results  Component Value Date   CHOL 191 10/16/2019   HDL 50 10/16/2019   LDLCALC 131 (H) 10/16/2019   TRIG 55 10/16/2019   Lab Results  Component Value Date   WBC 5.0 10/16/2019   HGB 13.7 10/16/2019   HCT 40.6 10/16/2019   MCV 85 10/16/2019   PLT 220 10/16/2019   Lab Results  Component Value Date   IRON 45 10/16/2019   TIBC 237 (L) 10/16/2019   FERRITIN 241 (H) 10/16/2019   Attestation Statements:   Reviewed by clinician  on day of visit: allergies, medications, problem list, medical history, surgical history, family history, social history, and previous encounter notes.  I, Water quality scientist, CMA, am acting as transcriptionist for Briscoe Deutscher, DO  I have reviewed the above documentation for accuracy and completeness, and I agree with the above. Briscoe Deutscher, DO

## 2020-08-26 ENCOUNTER — Other Ambulatory Visit: Payer: Self-pay

## 2020-08-26 ENCOUNTER — Ambulatory Visit (INDEPENDENT_AMBULATORY_CARE_PROVIDER_SITE_OTHER): Payer: BC Managed Care – PPO

## 2020-08-26 DIAGNOSIS — M25562 Pain in left knee: Secondary | ICD-10-CM | POA: Diagnosis not present

## 2020-08-26 DIAGNOSIS — J4550 Severe persistent asthma, uncomplicated: Secondary | ICD-10-CM

## 2020-08-31 DIAGNOSIS — M25561 Pain in right knee: Secondary | ICD-10-CM | POA: Diagnosis not present

## 2020-09-08 ENCOUNTER — Telehealth (INDEPENDENT_AMBULATORY_CARE_PROVIDER_SITE_OTHER): Payer: BC Managed Care – PPO | Admitting: Family Medicine

## 2020-09-09 DIAGNOSIS — M25561 Pain in right knee: Secondary | ICD-10-CM | POA: Diagnosis not present

## 2020-09-22 DIAGNOSIS — M25561 Pain in right knee: Secondary | ICD-10-CM | POA: Diagnosis not present

## 2020-10-06 ENCOUNTER — Ambulatory Visit (INDEPENDENT_AMBULATORY_CARE_PROVIDER_SITE_OTHER): Payer: BC Managed Care – PPO | Admitting: Family Medicine

## 2020-10-06 ENCOUNTER — Encounter (INDEPENDENT_AMBULATORY_CARE_PROVIDER_SITE_OTHER): Payer: Self-pay | Admitting: Family Medicine

## 2020-10-06 ENCOUNTER — Other Ambulatory Visit: Payer: Self-pay

## 2020-10-06 VITALS — BP 126/85 | HR 86 | Temp 97.8°F | Ht 64.0 in | Wt 215.0 lb

## 2020-10-06 DIAGNOSIS — Z9189 Other specified personal risk factors, not elsewhere classified: Secondary | ICD-10-CM

## 2020-10-06 DIAGNOSIS — R632 Polyphagia: Secondary | ICD-10-CM | POA: Diagnosis not present

## 2020-10-06 DIAGNOSIS — I10 Essential (primary) hypertension: Secondary | ICD-10-CM | POA: Diagnosis not present

## 2020-10-06 DIAGNOSIS — E8881 Metabolic syndrome: Secondary | ICD-10-CM | POA: Diagnosis not present

## 2020-10-06 DIAGNOSIS — Z6837 Body mass index (BMI) 37.0-37.9, adult: Secondary | ICD-10-CM

## 2020-10-06 MED ORDER — WEGOVY 0.25 MG/0.5ML ~~LOC~~ SOAJ: 0.2500 mg | SUBCUTANEOUS | 0 refills | Status: DC

## 2020-10-13 ENCOUNTER — Encounter (INDEPENDENT_AMBULATORY_CARE_PROVIDER_SITE_OTHER): Payer: Self-pay | Admitting: Family Medicine

## 2020-10-13 NOTE — Progress Notes (Signed)
Chief Complaint:   OBESITY Gabrielle Terry is here to discuss her progress with her obesity treatment plan along with follow-up of her obesity related diagnoses.   Today's visit was #: 14 Starting weight: 209 lbs Starting date: 10/16/2019 Today's weight: 215 lbs Today's date: 10/06/2020 Total lbs lost to date: +6 Body mass index is 36.9 kg/m.   Interim History:  Gabrielle Terry says she has been drinking a lot of water.  She says phentermine is not controlling her hunger.  She has transitioned to working back in the office and there are sabotage foods at work.  She will have labs done at work in April.  She will bring them in with her.  Current Meal Plan: the Category 1 Plan for 0% of the time.  Current Exercise Plan: None. Current Anti-Obesity Medications: Wegovy 0.25 mg subcutaneously weekly. Side effects: None.  Assessment/Plan:   1. Polyphagia Not at goal. Current treatment: phentermine 18.75 mg daily and metformin 500 mg twice daily. Polyphagia refers to excessive feelings of hunger. She will continue to focus on protein-rich, low simple carbohydrate foods. We reviewed the importance of hydration, regular exercise for stress reduction, and restorative sleep.  Plan:  Stop metformin and phentermine and start Wegovy 0.25 mg subcutaneously weekly, as per below.  - Start Semaglutide-Weight Management (WEGOVY) 0.25 MG/0.5ML SOAJ; Inject 0.25 mg into the skin once a week.  Dispense: 2 mL; Refill: 0  2. Essential hypertension Not at goal. Medications: Norvasc 2.5 mg daily.   Plan: Avoid buying foods that are: processed, frozen, or prepackaged to avoid excess salt. We will continue to monitor closely alongside her PCP and/or Specialist.  Regular follow up with PCP and specialists was also encouraged.   BP Readings from Last 3 Encounters:  10/06/20 126/85  08/19/20 136/81  07/12/20 135/82   Lab Results  Component Value Date   CREATININE 0.80 10/16/2019   3. Metabolic syndrome Starting  goal: Lose 7-10% of starting weight. She will continue to focus on protein-rich, low simple carbohydrate foods. We reviewed the importance of hydration, regular exercise for stress reduction, and restorative sleep.  We will continue to check lab work every 3 months, with 10% weight loss, or should any other concerns arise.  4. At risk for diabetes mellitus Gabrielle Terry was given diabetes prevention education and counseling today of more than 9 minutes. She will continue to focus on protein-rich, low simple carbohydrate foods. We reviewed the importance of hydration, regular exercise for stress reduction, and restorative sleep.   5. Class 2 severe obesity with serious comorbidity and body mass index (BMI) of 37.0 to 37.9 in adult, unspecified obesity type ([place])  Course: Gabrielle Terry is currently in the action stage of change. As such, her goal is to continue with weight loss efforts.   Nutrition goals: She has agreed to the Category 1 Plan.   Exercise goals: For substantial health benefits, adults should do at least 150 minutes (2 hours and 30 minutes) a week of moderate-intensity, or 75 minutes (1 hour and 15 minutes) a week of vigorous-intensity aerobic physical activity, or an equivalent combination of moderate- and vigorous-intensity aerobic activity. Aerobic activity should be performed in episodes of at least 10 minutes, and preferably, it should be spread throughout the week.  Behavioral modification strategies: increasing lean protein intake, decreasing simple carbohydrates, increasing vegetables and increasing water intake.  Gabrielle Terry has agreed to follow-up with our clinic in 3 weeks. She was informed of the importance of frequent follow-up visits to maximize her success  with intensive lifestyle modifications for her multiple health conditions.   Objective:   Blood pressure 126/85, pulse 86, temperature 97.8 F (36.6 C), temperature source Oral, height 5\' 4"  (1.626 m), weight 215 lb (97.5 kg), SpO2 97  %. Body mass index is 36.9 kg/m.  General: Cooperative, alert, well developed, in no acute distress. HEENT: Conjunctivae and lids unremarkable. Cardiovascular: Regular rhythm.  Lungs: Normal work of breathing. Neurologic: No focal deficits.   Lab Results  Component Value Date   CREATININE 0.80 10/16/2019   BUN 15 10/16/2019   NA 141 10/16/2019   K 3.5 10/16/2019   CL 102 10/16/2019   CO2 24 10/16/2019   Lab Results  Component Value Date   ALT 14 10/16/2019   AST 16 10/16/2019   ALKPHOS 57 10/16/2019   BILITOT 0.3 10/16/2019   Lab Results  Component Value Date   HGBA1C 5.9 (H) 10/16/2019   Lab Results  Component Value Date   INSULIN 10.1 10/16/2019   Lab Results  Component Value Date   TSH 1.610 10/16/2019   Lab Results  Component Value Date   CHOL 191 10/16/2019   HDL 50 10/16/2019   LDLCALC 131 (H) 10/16/2019   TRIG 55 10/16/2019   Lab Results  Component Value Date   WBC 5.0 10/16/2019   HGB 13.7 10/16/2019   HCT 40.6 10/16/2019   MCV 85 10/16/2019   PLT 220 10/16/2019   Lab Results  Component Value Date   IRON 45 10/16/2019   TIBC 237 (L) 10/16/2019   FERRITIN 241 (H) 10/16/2019   Attestation Statements:   Reviewed by clinician on day of visit: allergies, medications, problem list, medical history, surgical history, family history, social history, and previous encounter notes.  I, Water quality scientist, CMA, am acting as transcriptionist for Briscoe Deutscher, DO  I have reviewed the above documentation for accuracy and completeness, and I agree with the above. Briscoe Deutscher, DO

## 2020-10-13 NOTE — Telephone Encounter (Signed)
Dr.Wallace °

## 2020-10-16 ENCOUNTER — Other Ambulatory Visit: Payer: Self-pay | Admitting: Family Medicine

## 2020-10-16 NOTE — Telephone Encounter (Signed)
Approved per protocol. 90 day supply only-no future visit scheduled at this time.

## 2020-10-18 LAB — CBC: RBC: 5.15 — AB (ref 3.87–5.11)

## 2020-10-18 LAB — HEPATIC FUNCTION PANEL
ALT: 23 (ref 7–35)
AST: 21 (ref 13–35)
Alkaline Phosphatase: 72 (ref 25–125)
Bilirubin, Total: 0.5

## 2020-10-18 LAB — BASIC METABOLIC PANEL
BUN: 16 (ref 4–21)
Chloride: 102 (ref 99–108)
Creatinine: 0.8 (ref 0.5–1.1)
Glucose: 100
Potassium: 4.5 (ref 3.4–5.3)
Sodium: 141 (ref 137–147)

## 2020-10-18 LAB — CBC AND DIFFERENTIAL
HCT: 43 (ref 36–46)
Hemoglobin: 14.3 (ref 12.0–16.0)
Neutrophils Absolute: 3.3
Platelets: 240 (ref 150–399)
WBC: 5.9

## 2020-10-18 LAB — LIPID PANEL
Cholesterol: 201 — AB (ref 0–200)
HDL: 46 (ref 35–70)
LDL Cholesterol: 142
LDl/HDL Ratio: 3.1
Triglycerides: 69 (ref 40–160)

## 2020-10-18 LAB — TSH: TSH: 2.25 (ref 0.41–5.90)

## 2020-10-18 LAB — COMPREHENSIVE METABOLIC PANEL
Albumin: 4.5 (ref 3.5–5.0)
Calcium: 10.2 (ref 8.7–10.7)

## 2020-10-20 ENCOUNTER — Encounter (INDEPENDENT_AMBULATORY_CARE_PROVIDER_SITE_OTHER): Payer: Self-pay | Admitting: Family Medicine

## 2020-10-21 ENCOUNTER — Other Ambulatory Visit: Payer: Self-pay | Admitting: Allergy and Immunology

## 2020-10-21 ENCOUNTER — Ambulatory Visit: Payer: Self-pay

## 2020-10-21 DIAGNOSIS — J4550 Severe persistent asthma, uncomplicated: Secondary | ICD-10-CM

## 2020-10-28 ENCOUNTER — Ambulatory Visit: Payer: Self-pay

## 2020-11-01 ENCOUNTER — Other Ambulatory Visit: Payer: Self-pay | Admitting: Allergy and Immunology

## 2020-11-01 DIAGNOSIS — J4550 Severe persistent asthma, uncomplicated: Secondary | ICD-10-CM

## 2020-11-03 ENCOUNTER — Ambulatory Visit (INDEPENDENT_AMBULATORY_CARE_PROVIDER_SITE_OTHER): Payer: BC Managed Care – PPO | Admitting: Adult Health

## 2020-11-03 ENCOUNTER — Other Ambulatory Visit: Payer: Self-pay

## 2020-11-03 ENCOUNTER — Encounter (INDEPENDENT_AMBULATORY_CARE_PROVIDER_SITE_OTHER): Payer: Self-pay | Admitting: Adult Health

## 2020-11-03 VITALS — BP 146/86 | HR 77 | Temp 97.9°F | Ht 64.0 in | Wt 221.0 lb

## 2020-11-03 DIAGNOSIS — R632 Polyphagia: Secondary | ICD-10-CM | POA: Diagnosis not present

## 2020-11-03 DIAGNOSIS — Z6838 Body mass index (BMI) 38.0-38.9, adult: Secondary | ICD-10-CM | POA: Diagnosis not present

## 2020-11-03 DIAGNOSIS — Z9189 Other specified personal risk factors, not elsewhere classified: Secondary | ICD-10-CM

## 2020-11-03 DIAGNOSIS — I10 Essential (primary) hypertension: Secondary | ICD-10-CM

## 2020-11-03 MED ORDER — WEGOVY 0.25 MG/0.5ML ~~LOC~~ SOAJ: 0.5000 mg | SUBCUTANEOUS | 0 refills | Status: DC

## 2020-11-04 NOTE — Progress Notes (Signed)
Chief Complaint:   OBESITY Gabrielle Terry is here to discuss her progress with her obesity treatment plan along with follow-up of her obesity related diagnoses. Gabrielle Terry is on the Category 1 Plan and states she is following her eating plan approximately 40% of the time. Gabrielle Terry states she is walking 30-60 minutes 3 times per week.  Today's visit was #: 15 Starting weight: 209 lbs Starting date: 10/16/2019 Today's weight: 221 lbs Today's date: 11/03/2020 Total lbs lost to date: 0 Total lbs lost since last in-office visit: 0  Interim History: Gabrielle Terry has stopped phentermine and Metformin. She has had 3 doses of Wegovy 0.25 mg and tolerating it well. She will often have oatmeal with apples to help stimulate a BM.  Subjective:   1. Polyphagia Gabrielle Terry was taken off Metformin 500 mg BID and phentermine 37.5 mg 1/2 tab QD. She was started on Wegovy 0.25 mg once a week. She denies mass in neck, dysphagia, dyspepsia, or persistent hoarseness.  2. Essential hypertension Gabrielle Terry's BP is slightly elevated at OV. She reports rushing to appointment. She denies cardiac symptoms. She is on Norvasc 2.5 mg QD and Hyzaar 100-25 mg QD.  BP Readings from Last 3 Encounters:  11/03/20 (!) 146/86  10/06/20 126/85  08/19/20 136/81   3. At risk for constipation Gabrielle Terry is at increased risk for constipation due to increased GLP-1 dose for pre-diabetes.  Assessment/Plan:   1. Polyphagia Intensive lifestyle modifications are the first line treatment for this issue. We discussed several lifestyle modifications today and she will continue to work on diet, exercise and weight loss efforts. Orders and follow up as documented in patient record.  Counseling . Polyphagia is excessive hunger. . Causes can include: low blood sugars, hypERthyroidism, PMS, lack of sleep, stress, insulin resistance, diabetes, certain medications, and diets that are deficient in protein and fiber.   - Semaglutide-Weight Management (WEGOVY)  0.25 MG/0.5ML SOAJ; Inject 0.5 mg into the skin once a week.  Dispense: 4 mL; Refill: 0  2. Essential hypertension Gabrielle Terry is working on healthy weight loss and exercise to improve blood pressure control. We will watch for signs of hypotension as she continues her lifestyle modifications. Continue current anti-hypertensive regimen.  3. At risk for constipation Gabrielle Terry was given approximately 15 minutes of counseling today regarding prevention of constipation. She was encouraged to increase water and fiber intake.   4. Class 2 severe obesity with serious comorbidity and body mass index (BMI) of 38.0 to 38.9 in adult, unspecified obesity type Gabrielle Terry) Gabrielle Terry is currently in the action stage of change. As such, her goal is to continue with weight loss efforts. She has agreed to the Category 1 Plan with cal/protein goals for breakfast.   Exercise goals: As is  Behavioral modification strategies: increasing lean protein intake, meal planning and cooking strategies and planning for success.  Gabrielle Terry has agreed to follow-up with our clinic in 2 weeks. She was informed of the importance of frequent follow-up visits to maximize her success with intensive lifestyle modifications for her multiple health conditions.   Objective:   Blood pressure (!) 146/86, pulse 77, temperature 97.9 F (36.6 C), height 5\' 4"  (1.626 m), weight 221 lb (100.2 kg), SpO2 99 %. Body mass index is 37.93 kg/m.  General: Cooperative, alert, well developed, in no acute distress. HEENT: Conjunctivae and lids unremarkable. Cardiovascular: Regular rhythm.  Lungs: Normal work of breathing. Neurologic: No focal deficits.   Lab Results  Component Value Date   CREATININE 0.80 10/16/2019   BUN  15 10/16/2019   NA 141 10/16/2019   K 3.5 10/16/2019   CL 102 10/16/2019   CO2 24 10/16/2019   Lab Results  Component Value Date   ALT 14 10/16/2019   AST 16 10/16/2019   ALKPHOS 57 10/16/2019   BILITOT 0.3 10/16/2019   Lab Results   Component Value Date   HGBA1C 5.9 (H) 10/16/2019   Lab Results  Component Value Date   INSULIN 10.1 10/16/2019   Lab Results  Component Value Date   TSH 1.610 10/16/2019   Lab Results  Component Value Date   CHOL 191 10/16/2019   HDL 50 10/16/2019   LDLCALC 131 (H) 10/16/2019   TRIG 55 10/16/2019   Lab Results  Component Value Date   WBC 5.0 10/16/2019   HGB 13.7 10/16/2019   HCT 40.6 10/16/2019   MCV 85 10/16/2019   PLT 220 10/16/2019   Lab Results  Component Value Date   IRON 45 10/16/2019   TIBC 237 (L) 10/16/2019   FERRITIN 241 (H) 10/16/2019    Attestation Statements:   Reviewed by clinician on day of visit: allergies, medications, problem list, medical history, surgical history, family history, social history, and previous encounter notes.  Coral Ceo, am acting as Location manager for Mina Marble, NP.  I have reviewed the above documentation for accuracy and completeness, and I agree with the above. -  Kashana Breach d. Mitra Duling, NP-C

## 2020-11-09 ENCOUNTER — Other Ambulatory Visit: Payer: Self-pay

## 2020-11-09 ENCOUNTER — Ambulatory Visit (INDEPENDENT_AMBULATORY_CARE_PROVIDER_SITE_OTHER): Payer: BC Managed Care – PPO | Admitting: *Deleted

## 2020-11-09 DIAGNOSIS — J4550 Severe persistent asthma, uncomplicated: Secondary | ICD-10-CM | POA: Diagnosis not present

## 2020-11-10 ENCOUNTER — Encounter (INDEPENDENT_AMBULATORY_CARE_PROVIDER_SITE_OTHER): Payer: Self-pay | Admitting: Family Medicine

## 2020-11-15 ENCOUNTER — Encounter (INDEPENDENT_AMBULATORY_CARE_PROVIDER_SITE_OTHER): Payer: Self-pay

## 2020-11-24 ENCOUNTER — Ambulatory Visit (INDEPENDENT_AMBULATORY_CARE_PROVIDER_SITE_OTHER): Payer: BC Managed Care – PPO | Admitting: Family Medicine

## 2020-11-24 ENCOUNTER — Encounter (INDEPENDENT_AMBULATORY_CARE_PROVIDER_SITE_OTHER): Payer: Self-pay | Admitting: Family Medicine

## 2020-11-24 ENCOUNTER — Other Ambulatory Visit: Payer: Self-pay

## 2020-11-24 VITALS — BP 118/74 | HR 68 | Temp 98.6°F | Ht 64.0 in | Wt 218.0 lb

## 2020-11-24 DIAGNOSIS — E669 Obesity, unspecified: Secondary | ICD-10-CM

## 2020-11-24 DIAGNOSIS — R7303 Prediabetes: Secondary | ICD-10-CM | POA: Diagnosis not present

## 2020-11-24 DIAGNOSIS — Z9189 Other specified personal risk factors, not elsewhere classified: Secondary | ICD-10-CM | POA: Diagnosis not present

## 2020-11-24 DIAGNOSIS — R632 Polyphagia: Secondary | ICD-10-CM | POA: Diagnosis not present

## 2020-11-24 DIAGNOSIS — I10 Essential (primary) hypertension: Secondary | ICD-10-CM

## 2020-11-24 DIAGNOSIS — Z6834 Body mass index (BMI) 34.0-34.9, adult: Secondary | ICD-10-CM

## 2020-11-25 MED ORDER — SAXENDA 18 MG/3ML ~~LOC~~ SOPN: 3.0000 mg | PEN_INJECTOR | Freq: Every day | SUBCUTANEOUS | 0 refills | Status: DC

## 2020-11-25 MED ORDER — INSULIN PEN NEEDLE 32G X 4 MM MISC: 0 refills | Status: DC

## 2020-11-30 ENCOUNTER — Ambulatory Visit: Payer: BC Managed Care – PPO | Admitting: Allergy and Immunology

## 2020-12-01 ENCOUNTER — Encounter (INDEPENDENT_AMBULATORY_CARE_PROVIDER_SITE_OTHER): Payer: Self-pay | Admitting: Family Medicine

## 2020-12-01 NOTE — Progress Notes (Signed)
Chief Complaint:   OBESITY Gabrielle Terry is here to discuss her progress with her obesity treatment plan along with follow-up of her obesity related diagnoses.   Today's visit was #: 58 Starting weight: 209 lbs Starting date: 10/16/2019 Today's weight: 218 lbs Today's date: 11/24/2020 Weight change since last visit:  3 lbs Total lbs lost to date: +9 lbs Body mass index is 37.42 kg/m.   Interim History:  Gabrielle Terry says that Kirke Shaggy is covered by her insurance.  A1c is 5.9.   Current Meal Plan: the Category 1 Plan for 40-45% of the time.  Current Exercise Plan: Walking for 30-60 minutes 3 times per week.  Assessment/Plan:   1. Prediabetes Not at goal. Goal is HgbA1c < 5.7.  Medication: None.    Plan:  She will continue to focus on protein-rich, low simple carbohydrate foods. We reviewed the importance of hydration, regular exercise for stress reduction, and restorative sleep.   Lab Results  Component Value Date   HGBA1C 5.9 (H) 10/16/2019   Lab Results  Component Value Date   INSULIN 10.1 10/16/2019   2. Polyphagia Uncontrolled.  Current treatment: None. Polyphagia refers to excessive feelings of hunger. She will continue to focus on protein-rich, low simple carbohydrate foods. We reviewed the importance of hydration, regular exercise for stress reduction, and restorative sleep.  - Start Liraglutide -Weight Management (SAXENDA) 18 MG/3ML SOPN; Inject 3 mg into the skin daily.  Dispense: 15 mL; Refill: 0 - Insulin Pen Needle 32G X 4 MM MISC; Use daily with Saxenda  Dispense: 100 each; Refill: 0  3. Essential hypertension At goal. Medications: Norvasc 2.5 mg daily, Hyzaar 100-25 mg daily.   Plan: Avoid buying foods that are: processed, frozen, or prepackaged to avoid excess salt. We will watch for signs of hypotension as she continues lifestyle modifications.  BP Readings from Last 3 Encounters:  11/24/20 118/74  11/03/20 (!) 146/86  10/06/20 126/85   Lab Results  Component  Value Date   CREATININE 0.80 10/16/2019   4. At risk for heart disease Due to Gabrielle Terry's current state of health and medical condition(s), she is at a higher risk for heart disease.  This puts the patient at much greater risk to subsequently develop cardiopulmonary conditions that can significantly affect patient's quality of life in a negative manner.    At least 8 minutes were spent on counseling Gabrielle Terry about these concerns today. Evidence-based interventions for health behavior change were utilized today including the discussion of self monitoring techniques, problem-solving barriers, and SMART goal setting techniques.  Specifically, regarding patient's less desirable eating habits and patterns, we employed the technique of small changes when Gabrielle Terry has not been able to fully commit to her prudent nutritional plan.  5. Obesity, current BMI 37.5  Course: Gabrielle Terry is currently in the action stage of change. As such, her goal is to continue with weight loss efforts.   Nutrition goals: She has agreed to the Category 1 Plan.   Exercise goals: For substantial health benefits, adults should do at least 150 minutes (2 hours and 30 minutes) a week of moderate-intensity, or 75 minutes (1 hour and 15 minutes) a week of vigorous-intensity aerobic physical activity, or an equivalent combination of moderate- and vigorous-intensity aerobic activity. Aerobic activity should be performed in episodes of at least 10 minutes, and preferably, it should be spread throughout the week.  Behavioral modification strategies: increasing lean protein intake, decreasing simple carbohydrates, increasing vegetables and increasing water intake.  Gabrielle Terry has agreed to  follow-up with our clinic in 3 weeks. She was informed of the importance of frequent follow-up visits to maximize her success with intensive lifestyle modifications for her multiple health conditions.   Objective:   Blood pressure 118/74, pulse 68, temperature 98.6  F (37 C), temperature source Oral, height 5\' 4"  (1.626 m), weight 218 lb (98.9 kg), SpO2 98 %. Body mass index is 37.42 kg/m.  General: Cooperative, alert, well developed, in no acute distress. HEENT: Conjunctivae and lids unremarkable. Cardiovascular: Regular rhythm.  Lungs: Normal work of breathing. Neurologic: No focal deficits.   Lab Results  Component Value Date   CREATININE 0.80 10/16/2019   BUN 15 10/16/2019   NA 141 10/16/2019   K 3.5 10/16/2019   CL 102 10/16/2019   CO2 24 10/16/2019   Lab Results  Component Value Date   ALT 14 10/16/2019   AST 16 10/16/2019   ALKPHOS 57 10/16/2019   BILITOT 0.3 10/16/2019   Lab Results  Component Value Date   HGBA1C 5.9 (H) 10/16/2019   Lab Results  Component Value Date   INSULIN 10.1 10/16/2019   Lab Results  Component Value Date   TSH 1.610 10/16/2019   Lab Results  Component Value Date   CHOL 191 10/16/2019   HDL 50 10/16/2019   LDLCALC 131 (H) 10/16/2019   TRIG 55 10/16/2019   Lab Results  Component Value Date   WBC 5.0 10/16/2019   HGB 13.7 10/16/2019   HCT 40.6 10/16/2019   MCV 85 10/16/2019   PLT 220 10/16/2019   Lab Results  Component Value Date   IRON 45 10/16/2019   TIBC 237 (L) 10/16/2019   FERRITIN 241 (H) 10/16/2019   Attestation Statements:   Reviewed by clinician on day of visit: allergies, medications, problem list, medical history, surgical history, family history, social history, and previous encounter notes.  I, Water quality scientist, CMA, am acting as transcriptionist for Briscoe Deutscher, DO  I have reviewed the above documentation for accuracy and completeness, and I agree with the above. Briscoe Deutscher, DO

## 2020-12-02 ENCOUNTER — Other Ambulatory Visit: Payer: Self-pay | Admitting: Family Medicine

## 2020-12-02 DIAGNOSIS — I10 Essential (primary) hypertension: Secondary | ICD-10-CM

## 2020-12-02 NOTE — Telephone Encounter (Signed)
  Notes to clinic:  Review for refill Needs 6 month follow up Looks like patient maybe seeing a different provider     Requested Prescriptions  Pending Prescriptions Disp Refills   amLODipine (NORVASC) 2.5 MG tablet [Pharmacy Med Name: AMLODIPINE BESYLATE 2.5MG  TABLETS] 90 tablet 1    Sig: TAKE 1 TABLET(2.5 MG) BY MOUTH DAILY      Cardiovascular:  Calcium Channel Blockers Failed - 12/02/2020 11:48 AM      Failed - Valid encounter within last 6 months    Recent Outpatient Visits           1 year ago Pneumonia due to COVID-19 virus   [facility] Jerrol Banana., MD   1 year ago Pneumonia due to COVID-19 virus   [facility] Rosanna Randy, Retia Passe., MD   1 year ago Pneumonia due to COVID-19 virus   [facility] Jerrol Banana., MD   1 year ago Mild intermittent asthmatic bronchitis without complication   [facility] Carles Collet M, [place]   1 year ago Pain of right thumb   [facility] Jerrol Banana., MD       Future Appointments             Tomorrow Ambs, Kathrine Cords, FNP Allergy and [place] BP in normal range    BP Readings from Last 1 Encounters:  11/24/20 118/74

## 2020-12-03 ENCOUNTER — Ambulatory Visit: Payer: BC Managed Care – PPO | Admitting: Family Medicine

## 2020-12-03 ENCOUNTER — Encounter: Payer: Self-pay | Admitting: Family Medicine

## 2020-12-03 ENCOUNTER — Other Ambulatory Visit: Payer: Self-pay

## 2020-12-03 VITALS — BP 112/80 | HR 76 | Resp 16

## 2020-12-03 DIAGNOSIS — T63481D Toxic effect of venom of other arthropod, accidental (unintentional), subsequent encounter: Secondary | ICD-10-CM

## 2020-12-03 DIAGNOSIS — J4550 Severe persistent asthma, uncomplicated: Secondary | ICD-10-CM | POA: Diagnosis not present

## 2020-12-03 DIAGNOSIS — J302 Other seasonal allergic rhinitis: Secondary | ICD-10-CM

## 2020-12-03 DIAGNOSIS — J3089 Other allergic rhinitis: Secondary | ICD-10-CM | POA: Diagnosis not present

## 2020-12-03 MED ORDER — EPINEPHRINE 0.3 MG/0.3ML IJ SOAJ: INTRAMUSCULAR | 3 refills | Status: DC

## 2020-12-03 NOTE — Patient Instructions (Signed)
Asthma Continue Advair 230-2 puffs once a day to prevent cough or wheeze Continue albuterol 2 puffs once every 4 hours as needed for cough or wheeze You may use albuterol 2 puffs 5 to 15 minutes before activity to decrease cough or wheeze Continue Fasenra injections once every 8 weeks and have access to an epinephrine auto-injector For asthma flare, increase Advair 230 to 2 puffs twice a day for 2 weeks or until cough and wheeze free, then return to previous dosing  Allergic rhinitis Continue allergen avoidance measures directed toward pollens, dust mite, and mold as listed below.   Continue an over-the-counter antihistamine once a day as needed for runny nose or itch.Remember to rotate to a different antihistamine about every 3 months. Some examples of over the counter antihistamines include Zyrtec (cetirizine), Xyzal (levocetirizine), Allegra (fexofenadine), and Claritin (loratidine).  Consider saline nasal rinses as needed for nasal symptoms. Use this before any medicated nasal sprays for best result  Stinging insect allergy Continue to avoid encounters with stinging insects. In case of an allergic reaction, take Benadryl 50 mg every 4 hours, and if life-threatening symptoms occur, inject with EpiPen 0.3 mg.  Call the clinic if this treatment plan is not working well for you  Follow up in 6 months or sooner if needed.  Reducing Pollen Exposure The American Academy of Allergy, Asthma and Immunology suggests the following steps to reduce your exposure to pollen during allergy seasons. 1. Do not hang sheets or clothing out to dry; pollen may collect on these items. 2. Do not mow lawns or spend time around freshly cut grass; mowing stirs up pollen. 3. Keep windows closed at night.  Keep car windows closed while driving. 4. Minimize morning activities outdoors, a time when pollen counts are usually at their highest. 5. Stay indoors as much as possible when pollen counts or humidity is high and  on windy days when pollen tends to remain in the air longer. 6. Use air conditioning when possible.  Many air conditioners have filters that trap the pollen spores. 7. Use a HEPA room air filter to remove pollen form the indoor air you breathe.   Control of Dust Mite Allergen Dust mites play a major role in allergic asthma and rhinitis. They occur in environments with high humidity wherever human skin is found. Dust mites absorb humidity from the atmosphere (ie, they do not drink) and feed on organic matter (including shed human and animal skin). Dust mites are a microscopic type of insect that you cannot see with the naked eye. High levels of dust mites have been detected from mattresses, pillows, carpets, upholstered furniture, bed covers, clothes, soft toys and any woven material. The principal allergen of the dust mite is found in its feces. A gram of dust may contain 1,000 mites and 250,000 fecal particles. Mite antigen is easily measured in the air during house cleaning activities. Dust mites do not bite and do not cause harm to humans, other than by triggering allergies/asthma.  Ways to decrease your exposure to dust mites in your home:  1. Encase mattresses, box springs and pillows with a mite-impermeable barrier or cover  2. Wash sheets, blankets and drapes weekly in hot water (130 F) with detergent and dry them in a dryer on the hot setting.  3. Have the room cleaned frequently with a vacuum cleaner and a damp dust-mop. For carpeting or rugs, vacuuming with a vacuum cleaner equipped with a high-efficiency particulate air (HEPA) filter. The dust mite allergic  individual should not be in a room which is being cleaned and should wait 1 hour after cleaning before going into the room.  4. Do not sleep on upholstered furniture (eg, couches).  5. If possible removing carpeting, upholstered furniture and drapery from the home is ideal. Horizontal blinds should be eliminated in the rooms where  the person spends the most time (bedroom, study, television room). Washable vinyl, roller-type shades are optimal.  6. Remove all non-washable stuffed toys from the bedroom. Wash stuffed toys weekly like sheets and blankets above.  7. Reduce indoor humidity to less than 50%. Inexpensive humidity monitors can be purchased at most hardware stores. Do not use a humidifier as can make the problem worse and are not recommended.  Control of Mold Allergen Mold and fungi can grow on a variety of surfaces provided certain temperature and moisture conditions exist.  Outdoor molds grow on plants, decaying vegetation and soil.  The major outdoor mold, Alternaria and Cladosporium, are found in very high numbers during hot and dry conditions.  Generally, a late Summer - Fall peak is seen for common outdoor fungal spores.  Rain will temporarily lower outdoor mold spore count, but counts rise rapidly when the rainy period ends.  The most important indoor molds are Aspergillus and Penicillium.  Dark, humid and poorly ventilated basements are ideal sites for mold growth.  The next most common sites of mold growth are the bathroom and the kitchen.  Outdoor [employer] 8. Use air conditioning and keep windows closed 9. Avoid exposure to decaying vegetation. 10. Avoid leaf raking. 11. Avoid grain handling. 12. Consider wearing a face mask if working in moldy areas.  Indoor Mold Control 1. Maintain humidity below 50%. 2. Clean washable surfaces with 5% bleach solution. 3. Remove sources e.g. Contaminated carpets.

## 2020-12-03 NOTE — Progress Notes (Signed)
[place]  [id number] Dept: [phone]  FOLLOW UP NOTE  Patient ID: Gabrielle Terry, female    DOB: 1961-06-01  Age: 60 y.o. MRN: [id number] Date of Office Visit: 12/03/2020  Assessment  Chief Complaint: Asthma  HPI Gabrielle Terry is a 60 year old female who presents the clinic for follow-up visit.  She was last seen in this clinic on 06/01/2020 by Dr. Neldon Mc for evaluation of asthma, allergic rhinitis, and stinging insect allergy.  At today's visit, she reports her asthma has been well controlled with no shortness of breath, cough, or wheeze with activity or rest.  She continues Advair 230-2 puffs once a day and has not used her albuterol inhaler in several years.  She continues Fasenra injections 30 mg once every 8 weeks with no local reactions.  She reports a significant decrease in her symptoms of asthma while continuing on Fasenra injections.  Allergic rhinitis has been well controlled with occasional sneeze for which she is not currently using any medical intervention.  She continues to avoid stinging insects with no incidences or EpiPen use since her last visit to this clinic.  Her current medications are listed in the chart.   Drug Allergies:  No Known Allergies  Physical Exam: BP 112/80   Pulse 76   Resp 16   SpO2 95%    Physical Exam Vitals reviewed.  Constitutional:      Appearance: Normal appearance.  HENT:     Head: Normocephalic and atraumatic.     Right Ear: Tympanic membrane normal.     Left Ear: Tympanic membrane normal.     Nose:     Comments: Bilateral nares normal.  Pharynx normal.  Ears normal.  Eyes normal.    Mouth/Throat:     Pharynx: Oropharynx is clear.  Eyes:     Conjunctiva/sclera: Conjunctivae normal.  Cardiovascular:     Rate and Rhythm: Normal rate and regular rhythm.     Heart sounds: Normal heart sounds. No murmur heard.   Pulmonary:     Effort: Pulmonary effort is normal.     Breath sounds: Normal breath sounds.      Comments: Lungs clear to auscultation Musculoskeletal:        General: Normal range of motion.     Cervical back: Normal range of motion and neck supple.  Skin:    General: Skin is warm and dry.  Neurological:     Mental Status: She is alert and oriented to person, place, and time.  Psychiatric:        Mood and Affect: Mood normal.        Behavior: Behavior normal.        Thought Content: Thought content normal.        Judgment: Judgment normal.     Diagnostics: FVC 2.80, FEV1 2.31.  Predicted FVC 2.70, predicted FEV1 2.13.  Spirometry indicates normal ventilatory function.  Assessment and Plan: 1. Severe persistent asthma without complication   2. Seasonal and perennial allergic rhinitis   3. Anaphylaxis due to hymenoptera venom, accidental or unintentional, subsequent encounter     Meds ordered this encounter  Medications  . EPINEPHrine 0.3 mg/0.3 mL IJ SOAJ injection    Sig: Use as directed for life-threatening allergic reaction.    Dispense:  2 each    Refill:  3    Please dispense Mylan generic or Teva generic, no adrenaclick.  Thank you- HKR    Patient Instructions  Asthma Continue Advair 230-2 puffs once  a day to prevent cough or wheeze Continue albuterol 2 puffs once every 4 hours as needed for cough or wheeze You may use albuterol 2 puffs 5 to 15 minutes before activity to decrease cough or wheeze Continue Fasenra injections once every 8 weeks and have access to an epinephrine auto-injector For asthma flare, increase Advair 230 to 2 puffs twice a day for 2 weeks or until cough and wheeze free, then return to previous dosing  Allergic rhinitis Continue allergen avoidance measures directed toward pollens, dust mite, and mold as listed below.   Continue an over-the-counter antihistamine once a day as needed for runny nose or itch.Remember to rotate to a different antihistamine about every 3 months. Some examples of over the counter antihistamines include Zyrtec  (cetirizine), Xyzal (levocetirizine), Allegra (fexofenadine), and Claritin (loratidine).  Consider saline nasal rinses as needed for nasal symptoms. Use this before any medicated nasal sprays for best result  Stinging insect allergy Continue to avoid encounters with stinging insects. In case of an allergic reaction, take Benadryl 50 mg every 4 hours, and if life-threatening symptoms occur, inject with EpiPen 0.3 mg.  Call the clinic if this treatment plan is not working well for you  Follow up in 6 months or sooner if needed.   Return in about 6 months (around 06/05/2021), or if symptoms worsen or fail to improve.    Thank you for the opportunity to care for this patient.  Please do not hesitate to contact me with questions.  Gareth Morgan, FNP [facility]

## 2020-12-08 ENCOUNTER — Other Ambulatory Visit: Payer: Self-pay

## 2020-12-08 ENCOUNTER — Encounter (INDEPENDENT_AMBULATORY_CARE_PROVIDER_SITE_OTHER): Payer: Self-pay | Admitting: Family Medicine

## 2020-12-08 ENCOUNTER — Ambulatory Visit (INDEPENDENT_AMBULATORY_CARE_PROVIDER_SITE_OTHER): Payer: BC Managed Care – PPO | Admitting: Family Medicine

## 2020-12-08 VITALS — BP 121/71 | HR 79 | Temp 98.8°F | Ht 64.0 in | Wt 217.0 lb

## 2020-12-08 DIAGNOSIS — E669 Obesity, unspecified: Secondary | ICD-10-CM

## 2020-12-08 DIAGNOSIS — R7303 Prediabetes: Secondary | ICD-10-CM | POA: Diagnosis not present

## 2020-12-08 DIAGNOSIS — Z9189 Other specified personal risk factors, not elsewhere classified: Secondary | ICD-10-CM

## 2020-12-08 DIAGNOSIS — R632 Polyphagia: Secondary | ICD-10-CM | POA: Diagnosis not present

## 2020-12-08 DIAGNOSIS — Z6834 Body mass index (BMI) 34.0-34.9, adult: Secondary | ICD-10-CM

## 2020-12-08 MED ORDER — SAXENDA 18 MG/3ML ~~LOC~~ SOPN: 3.0000 mg | PEN_INJECTOR | Freq: Every day | SUBCUTANEOUS | 0 refills | Status: DC

## 2020-12-09 ENCOUNTER — Encounter (INDEPENDENT_AMBULATORY_CARE_PROVIDER_SITE_OTHER): Payer: Self-pay

## 2020-12-09 ENCOUNTER — Telehealth: Payer: Self-pay

## 2020-12-09 NOTE — Telephone Encounter (Signed)
Received fax from [facility]. Patient needs surgical clearance and needs to schedule appt. Left message to call back. Ok for [facility] to schedule next available appt.

## 2020-12-15 NOTE — Progress Notes (Signed)
Chief Complaint:   OBESITY Gabrielle Terry is here to discuss her progress with her obesity treatment plan along with follow-up of her obesity related diagnoses.   Today's visit was #: 85 Starting weight: 209 lbs Starting date: 10/16/2019 Today's weight: 217 lbs Today's date: 12/08/2020 Weight change since last visit: 1 lb Total lbs lost to date: +8 lbs Body mass index is 37.25 kg/m.   Interim History:  Gabrielle Terry says she is happy with her weight loss and muscle gain.  She has decreased her carbs.  She says she still does not have [place]. Current Meal Plan: the Category 1 Plan for 50% of the time.  Current Exercise Plan: None.  Assessment/Plan:    Meds ordered this encounter  Medications  . Liraglutide -Weight Management (SAXENDA) 18 MG/3ML SOPN    Sig: Inject 3 mg into the skin daily. Week 1: 0.6 mg Gabrielle Terry once daily x 1 week Week 2: 1.2 mg Gabrielle Terry once daily x 1 week Week 3: 1.8 mg Gabrielle Terry once daily x 1 week Week 4: 2.4 mg Gabrielle Terry once daily x 1 week Week 5 onward: 3 mg Gabrielle Terry once daily    Dispense:  15 mL    Refill:  0    1. Polyphagia Uncontrolled.  Current treatment: None.  Polyphagia refers to excessive feelings of hunger. She will continue to focus on protein-rich, low simple carbohydrate foods. We reviewed the importance of hydration, regular exercise for stress reduction, and restorative sleep.  - Start Liraglutide -Weight Management (SAXENDA) 18 MG/3ML SOPN; Inject 3 mg into the skin daily. Week 1: 0.6 mg Gabrielle Terry once daily x 1 week Week 2: 1.2 mg Gabrielle Terry once daily x 1 week Week 3: 1.8 mg Gabrielle Terry once daily x 1 week Week 4: 2.4 mg Gabrielle Terry once daily x 1 week Week 5 onward: 3 mg Gabrielle Terry once daily  Dispense: 15 mL; Refill: 0  2. Prediabetes Not at goal. Goal is HgbA1c < 5.7.  Medication: None.    Plan:  She will continue to focus on protein-rich, low simple carbohydrate foods. We reviewed the importance of hydration, regular exercise for stress reduction, and restorative sleep.   Lab Results  Component Value Date    HGBA1C 5.9 (H) 10/16/2019   Lab Results  Component Value Date   INSULIN 10.1 10/16/2019   3. At risk for heart disease Due to Gabrielle Terry's current state of health and medical condition(s), she is at a higher risk for heart disease.  This puts the patient at much greater risk to subsequently develop cardiopulmonary conditions that can significantly affect patient's quality of life in a negative manner.    At least 8 minutes were spent on counseling Gabrielle Terry about these concerns today. Evidence-based interventions for health behavior change were utilized today including the discussion of self monitoring techniques, problem-solving barriers, and SMART goal setting techniques.  Specifically, regarding patient's less desirable eating habits and patterns, we employed the technique of small changes when Gabrielle Terry has not been able to fully commit to her prudent nutritional plan.  4. Obesity, current BMI 37.4  Course: Gabrielle Terry is currently in the action stage of change. As such, her goal is to continue with weight loss efforts.   Nutrition goals: She has agreed to the Category 1 Plan.   Exercise goals: For substantial health benefits, adults should do at least 150 minutes (2 hours and 30 minutes) a week of moderate-intensity, or 75 minutes (1 hour and 15 minutes) a week of vigorous-intensity aerobic physical activity, or an  equivalent combination of moderate- and vigorous-intensity aerobic activity. Aerobic activity should be performed in episodes of at least 10 minutes, and preferably, it should be spread throughout the week.  Behavioral modification strategies: increasing lean protein intake, decreasing simple carbohydrates, increasing vegetables and increasing water intake.  Gabrielle Terry has agreed to follow-up with our clinic in 4 weeks. She was informed of the importance of frequent follow-up visits to maximize her success with intensive lifestyle modifications for her multiple health conditions.   Objective:    Blood pressure 121/71, pulse 79, temperature 98.8 F (37.1 C), temperature source Oral, height 5\' 4"  (1.626 m), weight 217 lb (98.4 kg), SpO2 99 %. Body mass index is 37.25 kg/m.  General: Cooperative, alert, well developed, in no acute distress. HEENT: Conjunctivae and lids unremarkable. Cardiovascular: Regular rhythm.  Lungs: Normal work of breathing. Neurologic: No focal deficits.   Lab Results  Component Value Date   CREATININE 0.80 10/16/2019   BUN 15 10/16/2019   NA 141 10/16/2019   K 3.5 10/16/2019   CL 102 10/16/2019   CO2 24 10/16/2019   Lab Results  Component Value Date   ALT 14 10/16/2019   AST 16 10/16/2019   ALKPHOS 57 10/16/2019   BILITOT 0.3 10/16/2019   Lab Results  Component Value Date   HGBA1C 5.9 (H) 10/16/2019   Lab Results  Component Value Date   INSULIN 10.1 10/16/2019   Lab Results  Component Value Date   TSH 1.610 10/16/2019   Lab Results  Component Value Date   CHOL 191 10/16/2019   HDL 50 10/16/2019   LDLCALC 131 (H) 10/16/2019   TRIG 55 10/16/2019   Lab Results  Component Value Date   WBC 5.0 10/16/2019   HGB 13.7 10/16/2019   HCT 40.6 10/16/2019   MCV 85 10/16/2019   PLT 220 10/16/2019   Lab Results  Component Value Date   IRON 45 10/16/2019   TIBC 237 (L) 10/16/2019   FERRITIN 241 (H) 10/16/2019   Attestation Statements:   Reviewed by clinician on day of visit: allergies, medications, problem list, medical history, surgical history, family history, social history, and previous encounter notes.  I, Water quality scientist, CMA, am acting as transcriptionist for Briscoe Deutscher, DO  I have reviewed the above documentation for accuracy and completeness, and I agree with the above. Briscoe Deutscher, DO

## 2020-12-28 ENCOUNTER — Encounter (INDEPENDENT_AMBULATORY_CARE_PROVIDER_SITE_OTHER): Payer: Self-pay | Admitting: Family Medicine

## 2020-12-28 ENCOUNTER — Other Ambulatory Visit: Payer: Self-pay

## 2020-12-28 ENCOUNTER — Ambulatory Visit (INDEPENDENT_AMBULATORY_CARE_PROVIDER_SITE_OTHER): Payer: BC Managed Care – PPO | Admitting: Family Medicine

## 2020-12-28 VITALS — BP 108/74 | HR 66 | Temp 98.2°F | Ht 64.0 in | Wt 209.0 lb

## 2020-12-28 DIAGNOSIS — I10 Essential (primary) hypertension: Secondary | ICD-10-CM | POA: Diagnosis not present

## 2020-12-28 DIAGNOSIS — E8881 Metabolic syndrome: Secondary | ICD-10-CM | POA: Diagnosis not present

## 2020-12-28 DIAGNOSIS — R632 Polyphagia: Secondary | ICD-10-CM | POA: Diagnosis not present

## 2020-12-28 DIAGNOSIS — E669 Obesity, unspecified: Secondary | ICD-10-CM

## 2020-12-28 DIAGNOSIS — Z6834 Body mass index (BMI) 34.0-34.9, adult: Secondary | ICD-10-CM

## 2020-12-28 DIAGNOSIS — Z9189 Other specified personal risk factors, not elsewhere classified: Secondary | ICD-10-CM

## 2020-12-29 MED ORDER — SAXENDA 18 MG/3ML ~~LOC~~ SOPN: 3.0000 mg | PEN_INJECTOR | Freq: Every day | SUBCUTANEOUS | 0 refills | Status: DC

## 2020-12-29 NOTE — Progress Notes (Signed)
Chief Complaint:   OBESITY Gabrielle Terry is here to discuss her progress with her obesity treatment plan along with follow-up of her obesity related diagnoses. See Medical Weight Management Flowsheet for bioelectrical impedance results.  Today's visit was #: 18 Starting weight: 209 lbs Starting date: 10/16/2019 Today's weight: 209 lbs Today's date: 12/28/2020 Weight change since last visit: 0 Total lbs lost to date: 0 Body mass index is 35.87 kg/m.   Interim History: Gabrielle Terry started Saxenda in May.  She has increased to 3 mg over 2 weeks and tolerated it. Nutrition Plan: the Category 1 Plan for 40% of the time. Activity:  None. Anti-obesity medications: Saxenda 3 mg daily. Reported side effects: None.  Assessment/Plan:   1. Polyphagia Controlled. Current treatment: Saxenda 3 mg daily. Polyphagia refers to excessive feelings of hunger. She will continue to focus on protein-rich, low simple carbohydrate foods. We reviewed the importance of hydration, regular exercise for stress reduction, and restorative sleep.  2. Metabolic syndrome Starting goal: Lose 7-10% of starting weight. She will continue to focus on protein-rich, low simple carbohydrate foods. We reviewed the importance of hydration, regular exercise for stress reduction, and restorative sleep.  We will continue to check lab work every 3 months, with 10% weight loss, or should any other concerns arise.  3. Essential hypertension At goal. Medications: Norvasc 2.5 mg daily, Hyzaar 100-25 mg daily.   Plan: Avoid buying foods that are: processed, frozen, or prepackaged to avoid excess salt. We will watch for signs of hypotension as she continues lifestyle modifications.  BP Readings from Last 3 Encounters:  12/28/20 108/74  12/08/20 121/71  12/03/20 112/80   Lab Results  Component Value Date   CREATININE 0.80 10/16/2019   4. At risk for deficient intake of food Margit was given extensive education and counseling today of  more than 8 minutes on risks associated with deficient food intake.  Counseled her on the importance of following our prescribed meal plan and eating adequate amounts of protein.    5. Obesity, current BMI 35.9  Course: Gabrielle Terry is currently in the action stage of change. As such, her goal is to continue with weight loss efforts.   Nutrition goals: She has agreed to the Category 1 Plan.   Exercise goals: For substantial health benefits, adults should do at least 150 minutes (2 hours and 30 minutes) a week of moderate-intensity, or 75 minutes (1 hour and 15 minutes) a week of vigorous-intensity aerobic physical activity, or an equivalent combination of moderate- and vigorous-intensity aerobic activity. Aerobic activity should be performed in episodes of at least 10 minutes, and preferably, it should be spread throughout the week.  Behavioral modification strategies: increasing lean protein intake, decreasing simple carbohydrates, increasing vegetables, and increasing water intake.  [name] has agreed to follow-up with our clinic in 4 weeks. She was informed of the importance of frequent follow-up visits to maximize her success with intensive lifestyle modifications for her multiple health conditions.   Objective:   Blood pressure 108/74, pulse 66, temperature 98.2 F (36.8 C), temperature source Oral, height 5\' 4"  (1.626 m), weight 209 lb (94.8 kg), SpO2 97 %. Body mass index is 35.87 kg/m.  General: Cooperative, alert, well developed, in no acute distress. HEENT: Conjunctivae and lids unremarkable. Cardiovascular: Regular rhythm.  Lungs: Normal work of breathing. Neurologic: No focal deficits.   Lab Results  Component Value Date   CREATININE 0.80 10/16/2019   BUN 15 10/16/2019   NA 141 10/16/2019   K 3.5  10/16/2019   CL 102 10/16/2019   CO2 24 10/16/2019   Lab Results  Component Value Date   ALT 14 10/16/2019   AST 16 10/16/2019   ALKPHOS 57 10/16/2019   BILITOT 0.3 10/16/2019    Lab Results  Component Value Date   HGBA1C 5.9 (H) 10/16/2019   Lab Results  Component Value Date   INSULIN 10.1 10/16/2019   Lab Results  Component Value Date   TSH 1.610 10/16/2019   Lab Results  Component Value Date   CHOL 191 10/16/2019   HDL 50 10/16/2019   LDLCALC 131 (H) 10/16/2019   TRIG 55 10/16/2019   Lab Results  Component Value Date   WBC 5.0 10/16/2019   HGB 13.7 10/16/2019   HCT 40.6 10/16/2019   MCV 85 10/16/2019   PLT 220 10/16/2019   Lab Results  Component Value Date   IRON 45 10/16/2019   TIBC 237 (L) 10/16/2019   FERRITIN 241 (H) 10/16/2019   Attestation Statements:   Reviewed by clinician on day of visit: allergies, medications, problem list, medical history, surgical history, family history, social history, and previous encounter notes.  I, Water quality scientist, CMA, am acting as transcriptionist for Briscoe Deutscher, DO  I have reviewed the above documentation for accuracy and completeness, and I agree with the above. Briscoe Deutscher, DO

## 2021-01-03 ENCOUNTER — Ambulatory Visit: Payer: BC Managed Care – PPO | Admitting: Family Medicine

## 2021-01-04 ENCOUNTER — Encounter (INDEPENDENT_AMBULATORY_CARE_PROVIDER_SITE_OTHER): Payer: Self-pay | Admitting: Family Medicine

## 2021-01-04 ENCOUNTER — Ambulatory Visit (INDEPENDENT_AMBULATORY_CARE_PROVIDER_SITE_OTHER): Payer: BC Managed Care – PPO | Admitting: *Deleted

## 2021-01-04 ENCOUNTER — Other Ambulatory Visit: Payer: Self-pay

## 2021-01-04 DIAGNOSIS — R632 Polyphagia: Secondary | ICD-10-CM

## 2021-01-04 DIAGNOSIS — J4550 Severe persistent asthma, uncomplicated: Secondary | ICD-10-CM | POA: Diagnosis not present

## 2021-01-05 DIAGNOSIS — K581 Irritable bowel syndrome with constipation: Secondary | ICD-10-CM | POA: Diagnosis not present

## 2021-01-05 MED ORDER — SAXENDA 18 MG/3ML ~~LOC~~ SOPN: 3.0000 mg | PEN_INJECTOR | Freq: Every day | SUBCUTANEOUS | 0 refills | Status: DC

## 2021-01-05 NOTE — Addendum Note (Signed)
Addended by: Karren Cobble on: 01/05/2021 02:13 PM   Modules accepted: Orders

## 2021-01-11 ENCOUNTER — Ambulatory Visit (INDEPENDENT_AMBULATORY_CARE_PROVIDER_SITE_OTHER): Payer: BC Managed Care – PPO | Admitting: Family Medicine

## 2021-01-12 ENCOUNTER — Other Ambulatory Visit: Payer: Self-pay

## 2021-01-12 ENCOUNTER — Ambulatory Visit (INDEPENDENT_AMBULATORY_CARE_PROVIDER_SITE_OTHER): Payer: BC Managed Care – PPO | Admitting: Family Medicine

## 2021-01-12 ENCOUNTER — Encounter: Payer: Self-pay | Admitting: Family Medicine

## 2021-01-12 VITALS — BP 118/72 | HR 72 | Temp 98.8°F | Resp 16 | Ht 65.0 in | Wt 214.0 lb

## 2021-01-12 DIAGNOSIS — J301 Allergic rhinitis due to pollen: Secondary | ICD-10-CM

## 2021-01-12 DIAGNOSIS — I10 Essential (primary) hypertension: Secondary | ICD-10-CM

## 2021-01-12 DIAGNOSIS — E669 Obesity, unspecified: Secondary | ICD-10-CM

## 2021-01-12 DIAGNOSIS — J4521 Mild intermittent asthma with (acute) exacerbation: Secondary | ICD-10-CM | POA: Diagnosis not present

## 2021-01-12 DIAGNOSIS — E7800 Pure hypercholesterolemia, unspecified: Secondary | ICD-10-CM | POA: Diagnosis not present

## 2021-01-12 NOTE — Progress Notes (Signed)
Established patient visit   Patient: Gabrielle Terry   DOB: [birth year]   60 y.o. Female  MRN: [id number] Visit Date: 01/12/2021  Today's healthcare provider: Wilhemena Durie, MD   Chief Complaint  Patient presents with   Hypertension   Hyperlipidemia   Asthma   Subjective    HPI  Patient comes in today for follow-up.  She has been feeling well and has no complaints.  All problems are stable and she is taking her medications. Hypertension, follow-up  BP Readings from Last 3 Encounters:  01/12/21 118/72  12/28/20 108/74  12/08/20 121/71   Wt Readings from Last 3 Encounters:  01/12/21 214 lb (97.1 kg)  12/28/20 209 lb (94.8 kg)  12/08/20 217 lb (98.4 kg)     She was last seen for hypertension 2 years ago.  BP at that visit was 114/81. Management since that visit includes no medication changes.  She reports good compliance with treatment. She is not having side effects.  She is following a Regular diet. She is not exercising. She does not smoke.  Use of agents associated with hypertension: none.   Outside blood pressures are not being checked. Symptoms: No chest pain No chest pressure  No palpitations No syncope  No dyspnea No orthopnea  No paroxysmal nocturnal dyspnea No lower extremity edema   Pertinent labs: Lab Results  Component Value Date   CHOL 191 10/16/2019   HDL 50 10/16/2019   LDLCALC 131 (H) 10/16/2019   TRIG 55 10/16/2019   Lab Results  Component Value Date   NA 141 10/16/2019   K 3.5 10/16/2019   CREATININE 0.80 10/16/2019   GFRNONAA 82 10/16/2019   GFRAA 94 10/16/2019   GLUCOSE 103 (H) 10/16/2019     The 10-year ASCVD risk score Mikey Bussing DC Jr., et al., 2013) is: 5.1%   Follow up for asthma  The patient was last seen for this 2 years ago. Changes made at last visit include no medication changes.  She reports good compliance with treatment. She feels that condition is Unchanged. She is not having side effects.          Medications: Outpatient Medications Prior to Visit  Medication Sig   ADVAIR HFA 230-21 MCG/ACT inhaler INHALE 2 PUFFS INTO THE LUNGS TWICE DAILY   albuterol (VENTOLIN HFA) 108 (90 Base) MCG/ACT inhaler Inhale 2 puffs into the lungs every 6 (six) hours as needed for wheezing or shortness of breath.   amLODipine (NORVASC) 2.5 MG tablet TAKE 1 TABLET(2.5 MG) BY MOUTH DAILY   celecoxib (CELEBREX) 200 MG capsule Take 200 mg by mouth 2 (two) times daily.   EPINEPHrine 0.3 mg/0.3 mL IJ SOAJ injection Use as directed for life-threatening allergic reaction.   FASENRA PEN 30 MG/ML SOAJ Inject 30 mg into the skin every 8 (eight) weeks.   Insulin Pen Needle 32G X 4 MM MISC Use daily with Saxenda   linaclotide (LINZESS) 290 MCG CAPS capsule Take 290 mcg by mouth daily.   Liraglutide -Weight Management (SAXENDA) 18 MG/3ML SOPN Inject 3 mg into the skin daily.   losartan-hydrochlorothiazide (HYZAAR) 100-25 MG tablet Take 1 tablet by mouth daily.   lovastatin (MEVACOR) 20 MG tablet TAKE 1 TABLET(20 MG) BY MOUTH DAILY   Liraglutide -Weight Management (SAXENDA) 18 MG/3ML SOPN Inject 3 mg into the skin daily.   polyethylene glycol (MIRALAX / GLYCOLAX) packet Take 17 g by mouth daily.   Facility-Administered Medications Prior to Visit  Medication Dose Route Frequency  Provider   Benralizumab SOSY 30 mg  30 mg Subcutaneous Q8 Toney Reil, MD    Review of Systems  Constitutional:  Negative for activity change and fatigue.  Respiratory:  Negative for cough and shortness of breath.   Cardiovascular:  Negative for chest pain, palpitations and leg swelling.  Musculoskeletal:  Negative for arthralgias and myalgias.  Allergic/Immunologic: Negative for environmental allergies.  Neurological:  Negative for dizziness, light-headedness and headaches.  Psychiatric/Behavioral:  Negative for agitation, self-injury, sleep disturbance and suicidal ideas. The patient is not nervous/anxious.         Objective    BP 118/72   Pulse 72   Temp 98.8 F (37.1 C)   Resp 16   Ht 5\' 5"  (1.651 m)   Wt 214 lb (97.1 kg)   BMI 35.61 kg/m  BP Readings from Last 3 Encounters:  01/13/21 114/74  01/12/21 118/72  12/28/20 108/74   Wt Readings from Last 3 Encounters:  01/13/21 208 lb (94.3 kg)  01/12/21 214 lb (97.1 kg)  12/28/20 209 lb (94.8 kg)       Physical Exam Vitals reviewed.  Constitutional:      Appearance: She is well-developed.  HENT:     Head: Normocephalic and atraumatic.     Right Ear: External ear normal.     Left Ear: External ear normal.     Nose: Nose normal.  Eyes:     General: No scleral icterus.    Conjunctiva/sclera: Conjunctivae normal.  Neck:     Thyroid: No thyromegaly.  Cardiovascular:     Rate and Rhythm: Normal rate and regular rhythm.     Heart sounds: Normal heart sounds.  Pulmonary:     Effort: Pulmonary effort is normal.     Breath sounds: Normal breath sounds. No wheezing.  Abdominal:     Palpations: Abdomen is soft.  Skin:    General: Skin is warm and dry.  Neurological:     General: No focal deficit present.     Mental Status: She is alert and oriented to person, place, and time.  Psychiatric:        Mood and Affect: Mood normal.        Behavior: Behavior normal.        Thought Content: Thought content normal.        Judgment: Judgment normal.      No results found for any visits on 01/12/21.  Assessment & Plan     1. Essential hypertension Hypertension under good control.  2. Seasonal allergic rhinitis due to pollen Followed by Dr. Donneta Romberg.  3. Mild intermittent asthma with acute exacerbation Treatment per Dr. Donneta Romberg.  4. Hypercholesteremia On lovastatin  5. Obesity (BMI 30.0-34.9) Followed by management clinic   No follow-ups on file.      I, Wilhemena Durie, MD, have reviewed all documentation for this visit. The documentation on 01/16/21 for the exam, diagnosis, procedures, and orders are all accurate and  complete.    Oriana Horiuchi Cranford Mon, MD  [facility] [phone] (phone) [phone] (fax)  [place]

## 2021-01-13 ENCOUNTER — Ambulatory Visit (INDEPENDENT_AMBULATORY_CARE_PROVIDER_SITE_OTHER): Payer: BC Managed Care – PPO | Admitting: Family Medicine

## 2021-01-13 ENCOUNTER — Encounter (INDEPENDENT_AMBULATORY_CARE_PROVIDER_SITE_OTHER): Payer: Self-pay | Admitting: Family Medicine

## 2021-01-13 VITALS — BP 114/74 | HR 75 | Temp 98.1°F | Ht 64.0 in | Wt 208.0 lb

## 2021-01-13 DIAGNOSIS — Z6834 Body mass index (BMI) 34.0-34.9, adult: Secondary | ICD-10-CM

## 2021-01-13 DIAGNOSIS — Z9189 Other specified personal risk factors, not elsewhere classified: Secondary | ICD-10-CM | POA: Diagnosis not present

## 2021-01-13 DIAGNOSIS — I10 Essential (primary) hypertension: Secondary | ICD-10-CM | POA: Diagnosis not present

## 2021-01-13 DIAGNOSIS — E669 Obesity, unspecified: Secondary | ICD-10-CM

## 2021-01-13 DIAGNOSIS — G8929 Other chronic pain: Secondary | ICD-10-CM

## 2021-01-13 DIAGNOSIS — R632 Polyphagia: Secondary | ICD-10-CM

## 2021-01-13 DIAGNOSIS — E7849 Other hyperlipidemia: Secondary | ICD-10-CM

## 2021-01-13 DIAGNOSIS — M25561 Pain in right knee: Secondary | ICD-10-CM | POA: Diagnosis not present

## 2021-01-13 MED ORDER — SAXENDA 18 MG/3ML ~~LOC~~ SOPN: 3.0000 mg | PEN_INJECTOR | Freq: Every day | SUBCUTANEOUS | 2 refills | Status: DC

## 2021-01-19 NOTE — Progress Notes (Signed)
Chief Complaint:   OBESITY Gabrielle Terry is here to discuss her progress with her obesity treatment plan along with follow-up of her obesity related diagnoses.   Today's visit was #: 5 Starting weight: 209 lbs Starting date: 10/16/2019 Today's weight: 208 lbs Today's date: 01/13/2021 Weight change since last visit: 1 lb Total lbs lost to date: 1 lb Body mass index is 35.7 kg/m.  Total weight loss percentage to date: -0.48%  Current Meal Plan: the Category 1 Plan for 50% of the time.  Current Exercise Plan: Walking for 45-60 minutes 3 times per week. Current Anti-Obesity Medications: Saxenda 0.6 mg subcutaneously daily. Side effects: None.  Assessment/Plan:   1. Polyphagia Current treatment: Saxenda. Polyphagia refers to excessive feelings of hunger. She will continue to focus on protein-rich, low simple carbohydrate foods. We reviewed the importance of hydration, regular exercise for stress reduction, and restorative sleep.  - Refill Liraglutide -Weight Management (SAXENDA) 18 MG/3ML SOPN; Inject 3 mg into the skin daily.  Dispense: 15 mL; Refill: 2  2. Essential hypertension At goal. Medications: Norvasc 2.5 mg daily, Hyzaar 100-25 mg daily.   Plan: Avoid buying foods that are: processed, frozen, or prepackaged to avoid excess salt. We will watch for signs of hypotension as she continues lifestyle modifications.  BP Readings from Last 3 Encounters:  01/13/21 114/74  01/12/21 118/72  12/28/20 108/74   Lab Results  Component Value Date   CREATININE 0.80 10/16/2019   3. Other hyperlipidemia Course: Not optimized. Lipid-lowering medications: Mevacor 20 mg daily.   Plan: Dietary changes: Increase soluble fiber, decrease simple carbohydrates, decrease saturated fat. Exercise changes: Moderate to vigorous-intensity aerobic activity 150 minutes per week or as tolerated. We will continue to monitor along with PCP/specialists as it pertains to her weight loss journey.  Lab  Results  Component Value Date   CHOL 191 10/16/2019   HDL 50 10/16/2019   LDLCALC 131 (H) 10/16/2019   TRIG 55 10/16/2019   Lab Results  Component Value Date   ALT 14 10/16/2019   AST 16 10/16/2019   ALKPHOS 57 10/16/2019   BILITOT 0.3 10/16/2019   The 10-year ASCVD risk score Mikey Bussing DC Jr., et al., 2013) is: 4.6%   Values used to calculate the score:     Age: 60 years     Sex: Female     Is Non-Hispanic African American: Yes     Diabetic: No     Tobacco smoker: No     Systolic Blood Pressure: 818 mmHg     Is BP treated: Yes     HDL Cholesterol: 48 mg/dL     Total Cholesterol: 185 mg/dL  4. Chronic pain of right knee Will follow because mobility and pain control are important for weight management.  5. At risk for deficient intake of food Gabrielle Terry was given extensive education and counseling today of more than 8 minutes on risks associated with deficient food intake.  Counseled her on the importance of following our prescribed meal plan and eating adequate amounts of protein.  Discussed with Gabrielle Terry that inadequate food intake over longer periods of time can slow their metabolism down significantly.    6. Obesity, current BMI 35.7  Course: Gabrielle Terry is currently in the action stage of change. As such, her goal is to continue with weight loss efforts.   Nutrition goals: She has agreed to the Category 1 Plan.   Exercise goals: For substantial health benefits, adults should do at least 150 minutes (2  hours and 30 minutes) a week of moderate-intensity, or 75 minutes (1 hour and 15 minutes) a week of vigorous-intensity aerobic physical activity, or an equivalent combination of moderate- and vigorous-intensity aerobic activity. Aerobic activity should be performed in episodes of at least 10 minutes, and preferably, it should be spread throughout the week.  Behavioral modification strategies: increasing lean protein intake, decreasing simple carbohydrates, increasing vegetables, and  increasing water intake.  Gabrielle Terry has agreed to follow-up with our clinic in 4 weeks. She was informed of the importance of frequent follow-up visits to maximize her success with intensive lifestyle modifications for her multiple health conditions.   Objective:   Blood pressure 114/74, pulse 75, temperature 98.1 F (36.7 C), temperature source Oral, height 5\' 4"  (1.626 m), weight 208 lb (94.3 kg), SpO2 96 %. Body mass index is 35.7 kg/m.  General: Cooperative, alert, well developed, in no acute distress. HEENT: Conjunctivae and lids unremarkable. Cardiovascular: Regular rhythm.  Lungs: Normal work of breathing. Neurologic: No focal deficits.   Lab Results  Component Value Date   CREATININE 0.80 10/16/2019   BUN 15 10/16/2019   NA 141 10/16/2019   K 3.5 10/16/2019   CL 102 10/16/2019   CO2 24 10/16/2019   Lab Results  Component Value Date   ALT 14 10/16/2019   AST 16 10/16/2019   ALKPHOS 57 10/16/2019   BILITOT 0.3 10/16/2019   Lab Results  Component Value Date   HGBA1C 5.9 (H) 10/16/2019   Lab Results  Component Value Date   INSULIN 10.1 10/16/2019   Lab Results  Component Value Date   TSH 1.610 10/16/2019   Lab Results  Component Value Date   CHOL 191 10/16/2019   HDL 50 10/16/2019   LDLCALC 131 (H) 10/16/2019   TRIG 55 10/16/2019   Lab Results  Component Value Date   VD25OH 44.0 10/16/2019   Lab Results  Component Value Date   WBC 5.0 10/16/2019   HGB 13.7 10/16/2019   HCT 40.6 10/16/2019   MCV 85 10/16/2019   PLT 220 10/16/2019   Lab Results  Component Value Date   IRON 45 10/16/2019   TIBC 237 (L) 10/16/2019   FERRITIN 241 (H) 10/16/2019   Attestation Statements:   Reviewed by clinician on day of visit: allergies, medications, problem list, medical history, surgical history, family history, social history, and previous encounter notes.  I, Water quality scientist, CMA, am acting as transcriptionist for Briscoe Deutscher, DO  I have reviewed the above  documentation for accuracy and completeness, and I agree with the above. Briscoe Deutscher, DO

## 2021-01-20 ENCOUNTER — Encounter (INDEPENDENT_AMBULATORY_CARE_PROVIDER_SITE_OTHER): Payer: Self-pay | Admitting: Family Medicine

## 2021-01-20 ENCOUNTER — Other Ambulatory Visit (INDEPENDENT_AMBULATORY_CARE_PROVIDER_SITE_OTHER): Payer: Self-pay | Admitting: Family Medicine

## 2021-01-26 ENCOUNTER — Encounter (INDEPENDENT_AMBULATORY_CARE_PROVIDER_SITE_OTHER): Payer: Self-pay | Admitting: Family Medicine

## 2021-01-26 ENCOUNTER — Ambulatory Visit (INDEPENDENT_AMBULATORY_CARE_PROVIDER_SITE_OTHER): Payer: BC Managed Care – PPO | Admitting: Family Medicine

## 2021-01-26 ENCOUNTER — Other Ambulatory Visit: Payer: Self-pay

## 2021-01-26 VITALS — BP 122/73 | HR 66 | Temp 98.1°F | Ht 64.0 in | Wt 205.0 lb

## 2021-01-26 DIAGNOSIS — E8881 Metabolic syndrome: Secondary | ICD-10-CM

## 2021-01-26 DIAGNOSIS — G8929 Other chronic pain: Secondary | ICD-10-CM

## 2021-01-26 DIAGNOSIS — Z9189 Other specified personal risk factors, not elsewhere classified: Secondary | ICD-10-CM | POA: Diagnosis not present

## 2021-01-26 DIAGNOSIS — M25561 Pain in right knee: Secondary | ICD-10-CM

## 2021-01-26 DIAGNOSIS — E669 Obesity, unspecified: Secondary | ICD-10-CM | POA: Diagnosis not present

## 2021-01-26 DIAGNOSIS — Z6834 Body mass index (BMI) 34.0-34.9, adult: Secondary | ICD-10-CM

## 2021-01-26 DIAGNOSIS — R632 Polyphagia: Secondary | ICD-10-CM

## 2021-01-26 MED ORDER — SAXENDA 18 MG/3ML ~~LOC~~ SOPN: 3.0000 mg | PEN_INJECTOR | Freq: Every day | SUBCUTANEOUS | 2 refills | Status: DC

## 2021-01-26 MED ORDER — INSULIN PEN NEEDLE 32G X 4 MM MISC: 0 refills | Status: DC

## 2021-01-30 ENCOUNTER — Other Ambulatory Visit: Payer: Self-pay | Admitting: Family Medicine

## 2021-01-30 DIAGNOSIS — I10 Essential (primary) hypertension: Secondary | ICD-10-CM

## 2021-01-30 NOTE — Telephone Encounter (Signed)
Requested Prescriptions  Pending Prescriptions Disp Refills  . losartan-hydrochlorothiazide (HYZAAR) 100-25 MG tablet [Pharmacy Med Name: LOSARTAN/HCTZ 100/25MG  TABLETS] 90 tablet 0    Sig: TAKE 1 TABLET BY MOUTH DAILY     Cardiovascular: ARB + Diuretic Combos Passed - 01/30/2021  8:03 AM      Passed - K in normal range and within 180 days    Potassium  Date Value Ref Range Status  10/18/2020 4.5 3.4 - 5.3 Final         Passed - Na in normal range and within 180 days    Sodium  Date Value Ref Range Status  10/18/2020 141 137 - 147 Final         Passed - Cr in normal range and within 180 days    Creatinine  Date Value Ref Range Status  10/18/2020 0.8 0.5 - 1.1 Final   Creatinine, Ser  Date Value Ref Range Status  10/16/2019 0.80 0.57 - 1.00 mg/dL Final         Passed - Ca in normal range and within 180 days    Calcium  Date Value Ref Range Status  10/18/2020 10.2 8.7 - 10.7 Final         Passed - Patient is not pregnant      Passed - Last BP in normal range    BP Readings from Last 1 Encounters:  01/26/21 122/73         Passed - Valid encounter within last 6 months    Recent Outpatient Visits          2 weeks ago Essential hypertension   [facility] Jerrol Banana., MD   1 year ago Pneumonia due to COVID-19 virus   [facility] Rosanna Randy, Retia Passe., MD   1 year ago Pneumonia due to COVID-19 virus   [facility] Jerrol Banana., MD   1 year ago Pneumonia due to COVID-19 virus   [facility] Jerrol Banana., MD   1 year ago Mild intermittent asthmatic bronchitis without complication   [facility] Trinna Post, [place]      Future Appointments            In 4 months Jerrol Banana., MD [facility], [place]

## 2021-02-02 NOTE — Progress Notes (Signed)
Chief Complaint:   OBESITY Gabrielle Terry is here to discuss her progress with her obesity treatment plan along with follow-up of her obesity related diagnoses.   Today's visit was #: 20 Starting weight: 209 lbs Starting date: 10/16/2019 Today's weight: 205 lbs Today's date: 01/26/2021 Weight change since last visit: 3 lbs Total lbs lost to date: 4 lbs Body mass index is 35.19 kg/m.  Total weight loss percentage to date: -1.91%  Interim History:  Gabrielle Terry says she is happy with her progress.  She denies polyphagia. Current Meal Plan: the Category 1 Plan for 50% of the time.  Current Exercise Plan: Walking for 30-45 minutes 3 times per week. Current Anti-Obesity Medications: Saxenda 3 mg subcutaneously daily. Side effects: None.  Assessment/Plan:   Meds ordered this encounter  Medications   Liraglutide -Weight Management (SAXENDA) 18 MG/3ML SOPN    Sig: Inject 3 mg into the skin daily.    Dispense:  15 mL    Refill:  2   Insulin Pen Needle 32G X 4 MM MISC    Sig: Use daily with Saxenda    Dispense:  100 each    Refill:  0   1. Polyphagia Controlled. Current treatment: Saxenda 3 mg subcutaneously daily. Polyphagia refers to excessive feelings of hunger. She will continue to focus on protein-rich, low simple carbohydrate foods. We reviewed the importance of hydration, regular exercise for stress reduction, and restorative sleep.  - Refill Liraglutide -Weight Management (SAXENDA) 18 MG/3ML SOPN; Inject 3 mg into the skin daily.  Dispense: 15 mL; Refill: 2 - Insulin Pen Needle 32G X 4 MM MISC; Use daily with Saxenda  Dispense: 100 each; Refill: 0  2. Chronic pain of right knee Will follow because mobility and pain control are important for weight management.  3. Metabolic syndrome Starting goal: Lose 7-10% of starting weight. She will continue to focus on protein-rich, low simple carbohydrate foods. We reviewed the importance of hydration, regular exercise for stress reduction, and  restorative sleep.  We will continue to check lab work every 3 months, with 10% weight loss, or should any other concerns arise.  4. At risk for heart disease Due to Gabrielle Terry's current state of health and medical condition(s), she is at a higher risk for heart disease.  This puts the patient at much greater risk to subsequently develop cardiopulmonary conditions that can significantly affect patient's quality of life in a negative manner.    At least 8 minutes were spent on counseling Gabrielle Terry about these concerns today. Evidence-based interventions for health behavior change were utilized today including the discussion of self monitoring techniques, problem-solving barriers, and SMART goal setting techniques.  Specifically, regarding patient's less desirable eating habits and patterns, we employed the technique of small changes when Gabrielle Terry has not been able to fully commit to her prudent nutritional plan.  5. Obesity, current BMI 35.3  Course: Gabrielle Terry is currently in the action stage of change. As such, her goal is to continue with weight loss efforts.   Nutrition goals: She has agreed to the Category 1 Plan.   Exercise goals:  As is.  Behavioral modification strategies: increasing lean protein intake, decreasing simple carbohydrates, increasing vegetables, and increasing water intake.  Gabrielle Terry has agreed to follow-up with our clinic in 4 weeks. She was informed of the importance of frequent follow-up visits to maximize her success with intensive lifestyle modifications for her multiple health conditions.   Objective:   Blood pressure 122/73, pulse 66, temperature 98.1 F (36.7  C), temperature source Oral, height 5\' 4"  (1.626 m), weight 205 lb (93 kg), SpO2 97 %. Body mass index is 35.19 kg/m.  General: Cooperative, alert, well developed, in no acute distress. HEENT: Conjunctivae and lids unremarkable. Cardiovascular: Regular rhythm.  Lungs: Normal work of breathing. Neurologic: No focal  deficits.   Lab Results  Component Value Date   CREATININE 0.8 10/18/2020   BUN 16 10/18/2020   NA 141 10/18/2020   K 4.5 10/18/2020   CL 102 10/18/2020   CO2 24 10/16/2019   Lab Results  Component Value Date   ALT 23 10/18/2020   AST 21 10/18/2020   ALKPHOS 72 10/18/2020   BILITOT 0.3 10/16/2019   Lab Results  Component Value Date   HGBA1C 5.9 (H) 10/16/2019   Lab Results  Component Value Date   INSULIN 10.1 10/16/2019   Lab Results  Component Value Date   TSH 2.25 10/18/2020   Lab Results  Component Value Date   CHOL 201 (A) 10/18/2020   HDL 46 10/18/2020   LDLCALC 142 10/18/2020   TRIG 69 10/18/2020   Lab Results  Component Value Date   VD25OH 44.0 10/16/2019   Lab Results  Component Value Date   WBC 5.9 10/18/2020   HGB 14.3 10/18/2020   HCT 43 10/18/2020   MCV 85 10/16/2019   PLT 240 10/18/2020   Lab Results  Component Value Date   IRON 45 10/16/2019   TIBC 237 (L) 10/16/2019   FERRITIN 241 (H) 10/16/2019   Attestation Statements:   Reviewed by clinician on day of visit: allergies, medications, problem list, medical history, surgical history, family history, social history, and previous encounter notes.  I, Water quality scientist, CMA, am acting as transcriptionist for Briscoe Deutscher, DO  I have reviewed the above documentation for accuracy and completeness, and I agree with the above. Briscoe Deutscher, DO

## 2021-02-08 ENCOUNTER — Encounter (INDEPENDENT_AMBULATORY_CARE_PROVIDER_SITE_OTHER): Payer: Self-pay | Admitting: Family Medicine

## 2021-02-08 NOTE — Telephone Encounter (Signed)
Dr.Wallace °

## 2021-02-21 ENCOUNTER — Ambulatory Visit (INDEPENDENT_AMBULATORY_CARE_PROVIDER_SITE_OTHER): Payer: BC Managed Care – PPO | Admitting: Adult Health

## 2021-02-21 ENCOUNTER — Encounter (INDEPENDENT_AMBULATORY_CARE_PROVIDER_SITE_OTHER): Payer: Self-pay | Admitting: Adult Health

## 2021-02-21 ENCOUNTER — Other Ambulatory Visit: Payer: Self-pay

## 2021-02-21 VITALS — BP 124/84 | HR 66 | Temp 98.5°F | Ht 64.0 in | Wt 208.0 lb

## 2021-02-21 DIAGNOSIS — Z9189 Other specified personal risk factors, not elsewhere classified: Secondary | ICD-10-CM

## 2021-02-21 DIAGNOSIS — G8929 Other chronic pain: Secondary | ICD-10-CM

## 2021-02-21 DIAGNOSIS — E669 Obesity, unspecified: Secondary | ICD-10-CM | POA: Diagnosis not present

## 2021-02-21 DIAGNOSIS — R632 Polyphagia: Secondary | ICD-10-CM

## 2021-02-21 DIAGNOSIS — Z6834 Body mass index (BMI) 34.0-34.9, adult: Secondary | ICD-10-CM

## 2021-02-21 DIAGNOSIS — M25561 Pain in right knee: Secondary | ICD-10-CM | POA: Diagnosis not present

## 2021-02-21 MED ORDER — SAXENDA 18 MG/3ML ~~LOC~~ SOPN: 3.0000 mg | PEN_INJECTOR | Freq: Every day | SUBCUTANEOUS | 2 refills | Status: DC

## 2021-02-23 DIAGNOSIS — Z6834 Body mass index (BMI) 34.0-34.9, adult: Secondary | ICD-10-CM | POA: Insufficient documentation

## 2021-02-23 DIAGNOSIS — G8929 Other chronic pain: Secondary | ICD-10-CM | POA: Insufficient documentation

## 2021-02-23 DIAGNOSIS — E669 Obesity, unspecified: Secondary | ICD-10-CM | POA: Insufficient documentation

## 2021-02-23 NOTE — Progress Notes (Signed)
Chief Complaint:   OBESITY Gabrielle Terry is here to discuss her progress with her obesity treatment plan along with follow-up of her obesity related diagnoses. Gabrielle Terry is on the Category 1 Plan and states she is following her eating plan approximately 40% of the time. Gabrielle Terry states she is not exercising regularly at this time.  Today's visit was #: 21 Starting weight: 209 lbs Starting date: 10/16/2019 Today's weight: 208 lbs Today's date: 02/21/2021 Total lbs lost to date: 1 lb Total lbs lost since last in-office visit: 0  Interim History: Gabrielle Terry feels that she can follow breakfast and lunch as prescribed on Category 1 meal plan.   Dinner is more difficult to follow and often between 7-8 pm.  She would like to consume dinner earlier.  Subjective:   1. Polyphagia Gabrielle Terry started on Saxenda 3 mg on 11/24/2020 - did not titrate.  Denies mas in neck, dysphagia, dyspepsia, persistent hoarseness.     2. Chronic pain of right knee She currently rates her right knee pain at 0/10.  She reports stiffness upon waking that will eventually subside with movement throughout the day.  3. At risk for constipation Gabrielle Terry is at increased risk for constipation due to inadequate water intake, changes in diet, and use of GLP-1 for polyphagia. Autymn denies hard, infrequent stools currently.    Assessment/Plan:   1. Polyphagia Intensive lifestyle modifications are the first line treatment for this issue. We discussed several lifestyle modifications today and she will continue to work on diet, exercise and weight loss efforts. Orders and follow up as documented in patient record.    Counseling Polyphagia is excessive hunger. Causes can include: low blood sugars, hypERthyroidism, PMS, lack of sleep, stress, insulin resistance, diabetes, certain medications, and diets that are deficient in protein and fiber.   - Refill Liraglutide -Weight Management (SAXENDA) 18 MG/3ML SOPN; Inject 3 mg into the skin daily.   Dispense: 15 mL; Refill: 2  2. Chronic pain of right knee Continue Category 1 meal plan.  Increase daily walking.  3. At risk for constipation Tyquasha was given approximately 15 minutes of counseling today regarding prevention of constipation. She was encouraged to increase water and fiber intake.    4. Obesity, current BMI 35.8  Gabrielle Terry is currently in the action stage of change. As such, her goal is to continue with weight loss efforts. She has agreed to the Category 1 Plan with 300-400 calories and 35 grams of protein at supper.   Handout:  Recipe Guide II.  Fasting labs at next office visit.  Exercise goals:  Increase dakly walking to 5-10 minutes per day.  Behavioral modification strategies: increasing lean protein intake, decreasing simple carbohydrates, meal planning and cooking strategies, keeping healthy foods in the home, and planning for success.  Gabrielle Terry has agreed to follow-up with our clinic in 3 weeks, fasting. She was informed of the importance of frequent follow-up visits to maximize her success with intensive lifestyle modifications for her multiple health conditions.   Objective:   Blood pressure 124/84, pulse 66, temperature 98.5 F (36.9 C), height '5\' 4"'$  (1.626 m), weight 208 lb (94.3 kg), SpO2 99 %. Body mass index is 35.7 kg/m.  General: Cooperative, alert, well developed, in no acute distress. HEENT: Conjunctivae and lids unremarkable. Cardiovascular: Regular rhythm.  Lungs: Normal work of breathing. Neurologic: No focal deficits.   Lab Results  Component Value Date   CREATININE 0.8 10/18/2020   BUN 16 10/18/2020   NA 141 10/18/2020   K  4.5 10/18/2020   CL 102 10/18/2020   CO2 24 10/16/2019   Lab Results  Component Value Date   ALT 23 10/18/2020   AST 21 10/18/2020   ALKPHOS 72 10/18/2020   BILITOT 0.3 10/16/2019   Lab Results  Component Value Date   HGBA1C 5.9 (H) 10/16/2019   Lab Results  Component Value Date   INSULIN 10.1 10/16/2019    Lab Results  Component Value Date   TSH 2.25 10/18/2020   Lab Results  Component Value Date   CHOL 201 (A) 10/18/2020   HDL 46 10/18/2020   LDLCALC 142 10/18/2020   TRIG 69 10/18/2020   Lab Results  Component Value Date   VD25OH 44.0 10/16/2019   Lab Results  Component Value Date   WBC 5.9 10/18/2020   HGB 14.3 10/18/2020   HCT 43 10/18/2020   MCV 85 10/16/2019   PLT 240 10/18/2020   Lab Results  Component Value Date   IRON 45 10/16/2019   TIBC 237 (L) 10/16/2019   FERRITIN 241 (H) 10/16/2019   Attestation Statements:   Reviewed by clinician on day of visit: allergies, medications, problem list, medical history, surgical history, family history, social history, and previous encounter notes.  I, Water quality scientist, CMA, am acting as Location manager for Mina Marble, NP.  I have reviewed the above documentation for accuracy and completeness, and I agree with the above. -  Nalleli Largent d. Olivier Frayre, NP-C

## 2021-02-28 ENCOUNTER — Ambulatory Visit (INDEPENDENT_AMBULATORY_CARE_PROVIDER_SITE_OTHER): Payer: BC Managed Care – PPO | Admitting: Family Medicine

## 2021-03-01 ENCOUNTER — Ambulatory Visit: Payer: Self-pay

## 2021-03-01 ENCOUNTER — Other Ambulatory Visit: Payer: Self-pay | Admitting: Family Medicine

## 2021-03-03 ENCOUNTER — Ambulatory Visit: Payer: Self-pay

## 2021-03-04 ENCOUNTER — Other Ambulatory Visit: Payer: Self-pay

## 2021-03-04 ENCOUNTER — Ambulatory Visit (INDEPENDENT_AMBULATORY_CARE_PROVIDER_SITE_OTHER): Payer: BC Managed Care – PPO

## 2021-03-04 DIAGNOSIS — J4550 Severe persistent asthma, uncomplicated: Secondary | ICD-10-CM

## 2021-03-13 ENCOUNTER — Other Ambulatory Visit: Payer: Self-pay | Admitting: Family Medicine

## 2021-03-13 DIAGNOSIS — I10 Essential (primary) hypertension: Secondary | ICD-10-CM

## 2021-03-13 NOTE — Telephone Encounter (Signed)
pt picked up refill 8/13

## 2021-03-14 ENCOUNTER — Encounter (INDEPENDENT_AMBULATORY_CARE_PROVIDER_SITE_OTHER): Payer: Self-pay | Admitting: Family Medicine

## 2021-03-14 ENCOUNTER — Other Ambulatory Visit: Payer: Self-pay

## 2021-03-14 ENCOUNTER — Ambulatory Visit (INDEPENDENT_AMBULATORY_CARE_PROVIDER_SITE_OTHER): Payer: BC Managed Care – PPO | Admitting: Family Medicine

## 2021-03-14 VITALS — BP 106/73 | HR 68 | Temp 98.0°F | Ht 64.0 in | Wt 206.0 lb

## 2021-03-14 DIAGNOSIS — Z9189 Other specified personal risk factors, not elsewhere classified: Secondary | ICD-10-CM | POA: Diagnosis not present

## 2021-03-14 DIAGNOSIS — M25561 Pain in right knee: Secondary | ICD-10-CM | POA: Diagnosis not present

## 2021-03-14 DIAGNOSIS — G8929 Other chronic pain: Secondary | ICD-10-CM

## 2021-03-14 DIAGNOSIS — R7301 Impaired fasting glucose: Secondary | ICD-10-CM

## 2021-03-14 DIAGNOSIS — E669 Obesity, unspecified: Secondary | ICD-10-CM | POA: Diagnosis not present

## 2021-03-14 DIAGNOSIS — Z6834 Body mass index (BMI) 34.0-34.9, adult: Secondary | ICD-10-CM

## 2021-03-14 DIAGNOSIS — K5909 Other constipation: Secondary | ICD-10-CM

## 2021-03-14 MED ORDER — TIRZEPATIDE 5 MG/0.5ML ~~LOC~~ SOAJ: 5.0000 mg | SUBCUTANEOUS | 0 refills | Status: DC

## 2021-03-15 NOTE — Progress Notes (Signed)
Chief Complaint:   OBESITY Gabrielle Terry is here to discuss her progress with her obesity treatment plan along with follow-up of her obesity related diagnoses.   Today's visit was #: 22 Starting weight: 209 lbs Starting date: 10/16/2019 Today's weight: 206 lbs Today's date: 03/14/2021 Weight change since last visit: 2 lbs Total lbs lost to date: 3 lbs Body mass index is 35.36 kg/m.  Total weight loss percentage to date: -1.44%  Current Meal Plan: the Category 1 Plan with 300-400 calories and 35 grams of protein at supper for 60% of the time.  Current Exercise Plan: Walking for 45-60 minutes 3-4 times per week. Current Anti-Obesity Medications: Saxenda 3 mg subcutaneously daily. Side effects: None.  Interim History:  [name] endorses constipation.  She is taking Linzess and MiraLAX.  She has recently added sea moss to bowel regimen  Assessment/Plan:   1. Chronic pain of right knee Will follow because mobility and pain control are important for weight management.  2. Impaired fasting glucose, with polyphagia Current treatment: Saxenda 3 mg subcutaneously daily. She will continue to focus on protein-rich, low simple carbohydrate foods. We reviewed the importance of hydration, regular exercise for stress reduction, and restorative sleep.  Plan:  Stop Saxenda and Start Mounjaro 5 mg subcutaneously weekly.  - Start tirzepatide [facility]) 5 MG/0.5ML Pen; Inject 5 mg into the skin once a week.  Dispense: 2 mL; Refill: 0  3. Chronic constipation This problem is worsening.  Will see if change to [facility] helps.  Advised to discontinue sea moss.  [name] was informed that a decrease in bowel movement frequency is normal while losing weight, but stools should not be hard or painful.  Counseling: Getting to Good Bowel Health: Your goal is to have one soft bowel movement each day. Drink at least 8 glasses of water each day. Eat plenty of fiber (goal is over 30 grams each day). It is best to get  most of your fiber from dietary sources which includes leafy green vegetables, fresh fruit, and whole grains. You may need to add fiber with the help of OTC fiber supplements. These include Metamucil, Citrucel, and Benefiber. If you are still having trouble, try adding an osmotic laxative such as Miralax. If all of these changes do not work, Cabin crew.   4. At risk for deficient intake of food [name] was given extensive education and counseling today of more than 8 minutes on risks associated with deficient food intake.  Counseled her on the importance of following our prescribed meal plan and eating adequate amounts of protein.  Discussed with Aayushi that inadequate food intake over longer periods of time can slow their metabolism down significantly.   5. Obesity, current BMI 35.4  Course: [name] is currently in the action stage of change. As such, her goal is to continue with weight loss efforts.   Nutrition goals: She has agreed to the Category 1 Plan with 300-400 calories and 35 grams of protein at supper.   Exercise goals:  As is.  Behavioral modification strategies: increasing lean protein intake, decreasing simple carbohydrates, increasing vegetables, and increasing water intake.  [name] has agreed to follow-up with our clinic in 4 weeks. She was informed of the importance of frequent follow-up visits to maximize her success with intensive lifestyle modifications for her multiple health conditions.   Objective:   Blood pressure 106/73, pulse 68, temperature 98 F (36.7 C), temperature source Oral, height '5\' 4"'$  (1.626 m), weight 206 lb (93.4 kg), SpO2  97 %. Body mass index is 35.36 kg/m.  General: Cooperative, alert, well developed, in no acute distress. HEENT: Conjunctivae and lids unremarkable. Cardiovascular: Regular rhythm.  Lungs: Normal work of breathing. Neurologic: No focal deficits.   Lab Results  Component Value Date   CREATININE 0.8 10/18/2020   BUN 16  10/18/2020   NA 141 10/18/2020   K 4.5 10/18/2020   CL 102 10/18/2020   CO2 24 10/16/2019   Lab Results  Component Value Date   ALT 23 10/18/2020   AST 21 10/18/2020   ALKPHOS 72 10/18/2020   BILITOT 0.3 10/16/2019   Lab Results  Component Value Date   HGBA1C 5.9 (H) 10/16/2019   Lab Results  Component Value Date   INSULIN 10.1 10/16/2019   Lab Results  Component Value Date   TSH 2.25 10/18/2020   Lab Results  Component Value Date   CHOL 201 (A) 10/18/2020   HDL 46 10/18/2020   LDLCALC 142 10/18/2020   TRIG 69 10/18/2020   Lab Results  Component Value Date   VD25OH 44.0 10/16/2019   Lab Results  Component Value Date   WBC 5.9 10/18/2020   HGB 14.3 10/18/2020   HCT 43 10/18/2020   MCV 85 10/16/2019   PLT 240 10/18/2020   Lab Results  Component Value Date   IRON 45 10/16/2019   TIBC 237 (L) 10/16/2019   FERRITIN 241 (H) 10/16/2019   Attestation Statements:   Reviewed by clinician on day of visit: allergies, medications, problem list, medical history, surgical history, family history, social history, and previous encounter notes.  I, Water quality scientist, CMA, am acting as transcriptionist for Briscoe Deutscher, DO  I have reviewed the above documentation for accuracy and completeness, and I agree with the above. Briscoe Deutscher, DO

## 2021-03-24 LAB — BASIC METABOLIC PANEL
BUN: 15 (ref 4–21)
CO2: 24 — AB (ref 13–22)
Chloride: 104 (ref 99–108)
Creatinine: 0.8 (ref 0.5–1.1)
Glucose: 91
Potassium: 3.7 (ref 3.4–5.3)
Sodium: 144 (ref 137–147)

## 2021-03-24 LAB — LIPID PANEL
Cholesterol: 180 (ref 0–200)
HDL: 42 (ref 35–70)
LDL Cholesterol: 125
Triglycerides: 69 (ref 40–160)

## 2021-03-24 LAB — HEPATIC FUNCTION PANEL
ALT: 17 (ref 7–35)
AST: 18 (ref 13–35)
Alkaline Phosphatase: 73 (ref 25–125)
Bilirubin, Total: 0.4

## 2021-03-24 LAB — MICROALBUMIN, URINE: Microalb, Ur: 14.8

## 2021-03-24 LAB — COMPREHENSIVE METABOLIC PANEL
Albumin: 4.6 (ref 3.5–5.0)
Calcium: 9.8 (ref 8.7–10.7)
Globulin: 2.1

## 2021-03-24 LAB — HEMOGLOBIN A1C: Hemoglobin A1C: 5.7

## 2021-03-25 DIAGNOSIS — Z1231 Encounter for screening mammogram for malignant neoplasm of breast: Secondary | ICD-10-CM | POA: Diagnosis not present

## 2021-04-06 ENCOUNTER — Ambulatory Visit (INDEPENDENT_AMBULATORY_CARE_PROVIDER_SITE_OTHER): Payer: BC Managed Care – PPO | Admitting: Family Medicine

## 2021-04-09 ENCOUNTER — Other Ambulatory Visit (INDEPENDENT_AMBULATORY_CARE_PROVIDER_SITE_OTHER): Payer: Self-pay | Admitting: Family Medicine

## 2021-04-09 DIAGNOSIS — R7301 Impaired fasting glucose: Secondary | ICD-10-CM

## 2021-04-11 ENCOUNTER — Encounter (INDEPENDENT_AMBULATORY_CARE_PROVIDER_SITE_OTHER): Payer: Self-pay

## 2021-04-11 NOTE — Telephone Encounter (Signed)
Msg sent to pt 

## 2021-04-11 NOTE — Telephone Encounter (Signed)
Pt last seen by Dr. Wallace.  

## 2021-04-13 ENCOUNTER — Other Ambulatory Visit (INDEPENDENT_AMBULATORY_CARE_PROVIDER_SITE_OTHER): Payer: Self-pay | Admitting: Family Medicine

## 2021-04-13 DIAGNOSIS — R7301 Impaired fasting glucose: Secondary | ICD-10-CM

## 2021-04-14 NOTE — Telephone Encounter (Signed)
Last OV with Dr Wallace 

## 2021-04-18 ENCOUNTER — Encounter (INDEPENDENT_AMBULATORY_CARE_PROVIDER_SITE_OTHER): Payer: Self-pay

## 2021-04-25 ENCOUNTER — Other Ambulatory Visit: Payer: Self-pay

## 2021-04-25 ENCOUNTER — Encounter (INDEPENDENT_AMBULATORY_CARE_PROVIDER_SITE_OTHER): Payer: Self-pay | Admitting: Family Medicine

## 2021-04-25 ENCOUNTER — Ambulatory Visit (INDEPENDENT_AMBULATORY_CARE_PROVIDER_SITE_OTHER): Payer: BC Managed Care – PPO | Admitting: Family Medicine

## 2021-04-25 VITALS — BP 93/65 | HR 82 | Temp 98.4°F | Ht 64.0 in | Wt 196.0 lb

## 2021-04-25 DIAGNOSIS — E669 Obesity, unspecified: Secondary | ICD-10-CM | POA: Diagnosis not present

## 2021-04-25 DIAGNOSIS — Z9189 Other specified personal risk factors, not elsewhere classified: Secondary | ICD-10-CM

## 2021-04-25 DIAGNOSIS — Z6834 Body mass index (BMI) 34.0-34.9, adult: Secondary | ICD-10-CM

## 2021-04-25 DIAGNOSIS — R7301 Impaired fasting glucose: Secondary | ICD-10-CM

## 2021-04-25 DIAGNOSIS — K5909 Other constipation: Secondary | ICD-10-CM

## 2021-04-25 DIAGNOSIS — I10 Essential (primary) hypertension: Secondary | ICD-10-CM | POA: Diagnosis not present

## 2021-04-25 MED ORDER — TIRZEPATIDE 7.5 MG/0.5ML ~~LOC~~ SOAJ: 7.5000 mg | SUBCUTANEOUS | 0 refills | Status: DC

## 2021-04-25 NOTE — Progress Notes (Signed)
Chief Complaint:   OBESITY Gabrielle Terry is here to discuss her progress with her obesity treatment plan along with follow-up of her obesity related diagnoses.   Today's visit was #: 23 Starting weight: 209 lbs Starting date: 10/16/2019 Today's weight: 196 lbs Today's date: 04/25/2021 Weight change since last visit: 10 lbs Total lbs lost to date: 13 lbs Body mass index is 33.64 kg/m.  Total weight loss percentage to date: -6.22%  Current Meal Plan: the Category 1 Plan and keeping a food journal and adhering to recommended goals of 300-400 calories and 35 grams of protein at supper for 80% of the time.  Current Exercise Plan: Walking/core strengthening for 30 minutes 4 times per week. Current Anti-Obesity Medications: Mounjaro 7.5 mg subcutaneously weekly. Side effects: None.  Interim History:  Gabrielle Terry has had 1 dose of Mounjaro 7.5 mg.  She had no increase in constipation.  A1c 5.7.    Assessment/Plan:   1. Essential hypertension Low today. Medications: amlodipine 2.5 mg daily and Hyzaar 100/25 mg daily.   Plan: Stop amlodipine.  Ambulatory blood pressure monitoring was encouraged with a goal of at least 2-3 times weekly or when feeling poorly.  She was instructed to keep a log for [place] to review at each office visit.    BP Readings from Last 3 Encounters:  04/25/21 93/65  03/14/21 106/73  02/21/21 124/84   Lab Results  Component Value Date   CREATININE 0.8 10/18/2020   2. Impaired fasting glucose, with polyphagia Controlled. Current treatment: Mounjaro 7.5 mg subcutaneously weekly. She will continue to focus on protein-rich, low simple carbohydrate foods. We reviewed the importance of hydration, regular exercise for stress reduction, and restorative sleep.  Plan:  Continue Mounjaro at current dose.  Will refill today, as per below.  - Refill tirzepatide (MOUNJARO) 7.5 MG/0.5ML Pen; Inject 7.5 mg into the skin once a week.  Dispense: 2 mL; Refill: 0  3. Chronic  constipation This problem is controlled.  Gabrielle Terry is taking Linzess 290 mcg daily.  Plan:  The current medical regimen is effective;  continue present plan and medications.  4. Obesity, current BMI 33.7  Course: Gabrielle Terry is currently in the action stage of change. As such, her goal is to continue with weight loss efforts.   Nutrition goals: She has agreed to the Category 1 Plan and keeping a food journal and adhering to recommended goals of 300-400 calories and 35 grams of protein at supper.   Exercise goals:  As is.  Behavioral modification strategies: increasing lean protein intake, decreasing simple carbohydrates, and increasing vegetables.  Gabrielle Terry has agreed to follow-up with our clinic in 4 weeks. She was informed of the importance of frequent follow-up visits to maximize her success with intensive lifestyle modifications for her multiple health conditions.   Objective:   Blood pressure 93/65, pulse 82, temperature 98.4 F (36.9 C), temperature source Oral, height 5\' 4"  (1.626 m), weight 196 lb (88.9 kg), SpO2 98 %. Body mass index is 33.64 kg/m.  General: Cooperative, alert, well developed, in no acute distress. HEENT: Conjunctivae and lids unremarkable. Cardiovascular: Regular rhythm.  Lungs: Normal work of breathing. Neurologic: No focal deficits.   Lab Results  Component Value Date   CREATININE 0.8 10/18/2020   BUN 16 10/18/2020   NA 141 10/18/2020   K 4.5 10/18/2020   CL 102 10/18/2020   CO2 24 10/16/2019   Lab Results  Component Value Date   ALT 23 10/18/2020   AST 21 10/18/2020  ALKPHOS 72 10/18/2020   BILITOT 0.3 10/16/2019   Lab Results  Component Value Date   HGBA1C 5.9 (H) 10/16/2019   Lab Results  Component Value Date   INSULIN 10.1 10/16/2019   Lab Results  Component Value Date   TSH 2.25 10/18/2020   Lab Results  Component Value Date   CHOL 201 (A) 10/18/2020   HDL 46 10/18/2020   LDLCALC 142 10/18/2020   TRIG 69 10/18/2020   Lab  Results  Component Value Date   VD25OH 44.0 10/16/2019   Lab Results  Component Value Date   WBC 5.9 10/18/2020   HGB 14.3 10/18/2020   HCT 43 10/18/2020   MCV 85 10/16/2019   PLT 240 10/18/2020   Lab Results  Component Value Date   IRON 45 10/16/2019   TIBC 237 (L) 10/16/2019   FERRITIN 241 (H) 10/16/2019   Attestation Statements:   Reviewed by clinician on day of visit: allergies, medications, problem list, medical history, surgical history, family history, social history, and previous encounter notes.  I, Water quality scientist, CMA, am acting as transcriptionist for Briscoe Deutscher, DO  I have reviewed the above documentation for accuracy and completeness, and I agree with the above. Briscoe Deutscher, DO

## 2021-04-28 ENCOUNTER — Encounter (INDEPENDENT_AMBULATORY_CARE_PROVIDER_SITE_OTHER): Payer: Self-pay | Admitting: Family Medicine

## 2021-04-28 ENCOUNTER — Ambulatory Visit: Payer: BC Managed Care – PPO

## 2021-04-28 NOTE — Telephone Encounter (Signed)
Pt sent outside labs for your review.

## 2021-05-03 ENCOUNTER — Other Ambulatory Visit: Payer: Self-pay

## 2021-05-03 ENCOUNTER — Ambulatory Visit: Payer: BC Managed Care – PPO

## 2021-05-03 DIAGNOSIS — J4550 Severe persistent asthma, uncomplicated: Secondary | ICD-10-CM

## 2021-05-03 NOTE — Progress Notes (Signed)
Immunotherapy   Patient Details  Name: Gabrielle Terry MRN: [id number] Date of Birth: June 19, 1961  05/03/2021  [name] came in to receive her Fasenra injection. Due to it being an auto injectable I asked patient if she would be self administering them at home, Patient informed me that she was nervous to. She stated that she would not be able to give herself the injection in her arm. I informed patient that with the auto injectable they are given in the stomach when self administering. Patient stated no one informed her how to give the injection and she felt like she had to give it in the arm. After walking patient through the administration of Fasenra patient felt more comfortable that she would be able to administer. Patient administered her Fasenra injection and demonstrated the correct way to administer. Patient stated it didn't hurt like it does in the arm. Patient will start self administering her Fasenra injection.  [name] 05/03/2021, 6:24 PM

## 2021-05-04 DIAGNOSIS — J329 Chronic sinusitis, unspecified: Secondary | ICD-10-CM | POA: Diagnosis not present

## 2021-05-14 ENCOUNTER — Other Ambulatory Visit (INDEPENDENT_AMBULATORY_CARE_PROVIDER_SITE_OTHER): Payer: Self-pay | Admitting: Family Medicine

## 2021-05-14 DIAGNOSIS — R7301 Impaired fasting glucose: Secondary | ICD-10-CM

## 2021-05-17 ENCOUNTER — Ambulatory Visit (INDEPENDENT_AMBULATORY_CARE_PROVIDER_SITE_OTHER): Payer: BC Managed Care – PPO | Admitting: Family Medicine

## 2021-05-18 DIAGNOSIS — J301 Allergic rhinitis due to pollen: Secondary | ICD-10-CM | POA: Diagnosis not present

## 2021-05-18 DIAGNOSIS — J3489 Other specified disorders of nose and nasal sinuses: Secondary | ICD-10-CM | POA: Diagnosis not present

## 2021-05-18 DIAGNOSIS — J0100 Acute maxillary sinusitis, unspecified: Secondary | ICD-10-CM | POA: Diagnosis not present

## 2021-05-25 ENCOUNTER — Ambulatory Visit (INDEPENDENT_AMBULATORY_CARE_PROVIDER_SITE_OTHER): Payer: BC Managed Care – PPO | Admitting: Family Medicine

## 2021-05-25 ENCOUNTER — Other Ambulatory Visit: Payer: Self-pay

## 2021-05-25 ENCOUNTER — Encounter (INDEPENDENT_AMBULATORY_CARE_PROVIDER_SITE_OTHER): Payer: Self-pay | Admitting: Family Medicine

## 2021-05-25 VITALS — BP 110/74 | HR 71 | Temp 97.9°F | Ht 64.0 in | Wt 189.0 lb

## 2021-05-25 DIAGNOSIS — R7301 Impaired fasting glucose: Secondary | ICD-10-CM

## 2021-05-25 DIAGNOSIS — K5909 Other constipation: Secondary | ICD-10-CM

## 2021-05-25 DIAGNOSIS — I10 Essential (primary) hypertension: Secondary | ICD-10-CM

## 2021-05-25 DIAGNOSIS — E669 Obesity, unspecified: Secondary | ICD-10-CM

## 2021-05-25 DIAGNOSIS — Z6834 Body mass index (BMI) 34.0-34.9, adult: Secondary | ICD-10-CM

## 2021-05-25 MED ORDER — MOUNJARO 7.5 MG/0.5ML ~~LOC~~ SOAJ: SUBCUTANEOUS | 0 refills | Status: DC

## 2021-05-25 NOTE — Progress Notes (Signed)
Chief Complaint:   OBESITY Gabrielle Terry is here to discuss her progress with her obesity treatment plan along with follow-up of her obesity related diagnoses. See Medical Weight Management Flowsheet for complete bioelectrical impedance results.  Today's visit was #: 24 Starting weight: 209 lbs Starting date: 10/16/2019 Weight change since last visit: 7 lbs Total lbs lost to date: 20 lbs Total weight loss percentage to date: -9.57%  Nutrition Plan:  Category 1 Plan and keeping a food journal and adhering to recommended goals of 300-400 calories and 35 grams of protein at supper for 50% of the time. Activity: Cardio for 20-30 minutes 7 times per week.  Anti-obesity medications: Mounjaro 7.5 mg subcutaneously weekly. Reported side effects: None.  Interim History: Gabrielle Terry is doing well.  She is tolerating Mounjaro 7.5 with no side effects.  Constipation is controlled with Linzess.  Recent sinusitis - treated by ENT with Augmentin and Mucinex DM (on this today).  Blood pressure well controlled.  Assessment/Plan:   1. Impaired fasting glucose, with polyphagia Controlled. Current treatment: Mounjaro 7.5 mg subcutaneously weekly.    Plan:  Continue Mounjaro 7.5 mg subcutaneously weekly.  Will refill today, as per below. She will continue to focus on protein-rich, low simple carbohydrate foods. We reviewed the importance of hydration, regular exercise for stress reduction, and restorative sleep.  - Refill tirzepatide (MOUNJARO) 7.5 MG/0.5ML Pen; INJECT 7.5 MILLIGRAMS INTO THE SKIN ONCE A WEEK  Dispense: 2 mL; Refill: 0  2. Chronic constipation This problem is controlled with Linzess 290 mcg daily.  The current medical regimen is effective;  continue present plan and medications.  3. Essential hypertension At goal. Medications: Hyzaar 100-25 mg daily.   Plan: Avoid buying foods that are: processed, frozen, or prepackaged to avoid excess salt. We will watch for signs of hypotension as she  continues lifestyle modifications.  BP Readings from Last 3 Encounters:  05/25/21 110/74  04/25/21 93/65  03/14/21 106/73   Lab Results  Component Value Date   CREATININE 0.8 10/18/2020   4. Obesity, current BMI 32.6  Course: Gabrielle Terry is currently in the action stage of change. As such, her goal is to continue with weight loss efforts.   Nutrition goals: She has agreed to the Category 1 Plan and keeping a food journal and adhering to recommended goals of 300-400 calories and 35 grams of protein at supper.   Exercise goals:  As is.  Behavioral modification strategies: increasing lean protein intake, decreasing simple carbohydrates, increasing vegetables, and increasing water intake.  Gabrielle Terry has agreed to follow-up with our clinic in 4-6 weeks. She was informed of the importance of frequent follow-up visits to maximize her success with intensive lifestyle modifications for her multiple health conditions.   Objective:   Blood pressure 110/74, pulse 71, temperature 97.9 F (36.6 C), temperature source Oral, height 5\' 4"  (1.626 m), weight 189 lb (85.7 kg), SpO2 99 %. Body mass index is 32.44 kg/m.  General: Cooperative, alert, well developed, in no acute distress. HEENT: Conjunctivae and lids unremarkable. Cardiovascular: Regular rhythm.  Lungs: Normal work of breathing. Neurologic: No focal deficits.   Lab Results  Component Value Date   CREATININE 0.8 10/18/2020   BUN 16 10/18/2020   NA 141 10/18/2020   K 4.5 10/18/2020   CL 102 10/18/2020   CO2 24 10/16/2019   Lab Results  Component Value Date   ALT 23 10/18/2020   AST 21 10/18/2020   ALKPHOS 72 10/18/2020   BILITOT 0.3 10/16/2019  Lab Results  Component Value Date   HGBA1C 5.9 (H) 10/16/2019   Lab Results  Component Value Date   INSULIN 10.1 10/16/2019   Lab Results  Component Value Date   TSH 2.25 10/18/2020   Lab Results  Component Value Date   CHOL 201 (A) 10/18/2020   HDL 46 10/18/2020   LDLCALC  142 10/18/2020   TRIG 69 10/18/2020   Lab Results  Component Value Date   VD25OH 44.0 10/16/2019   Lab Results  Component Value Date   WBC 5.9 10/18/2020   HGB 14.3 10/18/2020   HCT 43 10/18/2020   MCV 85 10/16/2019   PLT 240 10/18/2020   Lab Results  Component Value Date   IRON 45 10/16/2019   TIBC 237 (L) 10/16/2019   FERRITIN 241 (H) 10/16/2019   Attestation Statements:   Reviewed by clinician on day of visit: allergies, medications, problem list, medical history, surgical history, family history, social history, and previous encounter notes.  I, Water quality scientist, CMA, am acting as transcriptionist for Briscoe Deutscher, DO  I have reviewed the above documentation for accuracy and completeness, and I agree with the above. -  Briscoe Deutscher, DO, MS, FAAFP, DABOM - Family and Bariatric Medicine.

## 2021-05-26 ENCOUNTER — Other Ambulatory Visit (INDEPENDENT_AMBULATORY_CARE_PROVIDER_SITE_OTHER): Payer: Self-pay | Admitting: Family Medicine

## 2021-05-30 ENCOUNTER — Ambulatory Visit (INDEPENDENT_AMBULATORY_CARE_PROVIDER_SITE_OTHER): Payer: BC Managed Care – PPO | Admitting: Family Medicine

## 2021-06-14 ENCOUNTER — Other Ambulatory Visit: Payer: Self-pay

## 2021-06-14 ENCOUNTER — Ambulatory Visit (INDEPENDENT_AMBULATORY_CARE_PROVIDER_SITE_OTHER): Payer: BC Managed Care – PPO | Admitting: Family Medicine

## 2021-06-14 ENCOUNTER — Encounter (INDEPENDENT_AMBULATORY_CARE_PROVIDER_SITE_OTHER): Payer: Self-pay | Admitting: Family Medicine

## 2021-06-14 VITALS — BP 110/73 | HR 76 | Temp 98.0°F | Ht 64.0 in | Wt 184.0 lb

## 2021-06-14 DIAGNOSIS — K5909 Other constipation: Secondary | ICD-10-CM

## 2021-06-14 DIAGNOSIS — I10 Essential (primary) hypertension: Secondary | ICD-10-CM | POA: Diagnosis not present

## 2021-06-14 DIAGNOSIS — R7301 Impaired fasting glucose: Secondary | ICD-10-CM | POA: Diagnosis not present

## 2021-06-14 DIAGNOSIS — Z6837 Body mass index (BMI) 37.0-37.9, adult: Secondary | ICD-10-CM

## 2021-06-14 MED ORDER — TIRZEPATIDE 10 MG/0.5ML ~~LOC~~ SOAJ: 10.0000 mg | SUBCUTANEOUS | 0 refills | Status: DC

## 2021-06-14 NOTE — Progress Notes (Signed)
Chief Complaint:   OBESITY Gabrielle Terry is here to discuss her progress with her obesity treatment plan along with follow-up of her obesity related diagnoses. See Medical Weight Management Flowsheet for complete bioelectrical impedance results.  Today's visit was #: 25 Starting weight: 209 lbs Starting date: 10/16/2019 Weight change since last visit: 5 lbs Total lbs lost to date: 25 lbs Total weight loss percentage to date: -11.96%  Nutrition Plan: Category 1 Plan and keeping a food journal and adhering to recommended goals of 300-400 calories and 35 grams of protein at supper daily for 80% of the time Activity: Taking care of her mother.  She is ready to get back (bike). Anti-obesity medications: Mounjaro 7.5 mg subcutaneously weekly. Reported side effects: None.  Interim History: [employer] check-in December 15, will get her body measurements.  No issues or concerns.   Assessment/Plan:   1. Impaired fasting glucose, with polyphagia Improving, but not optimized. Current treatment: Mounjaro 7.5 mg subcutaneously weekly.    Plan:  Increase Mounjaro to 10 mg subcutaneously weekly, as per below.  She will continue to focus on protein-rich, low simple carbohydrate foods. We reviewed the importance of hydration, regular exercise for stress reduction, and restorative sleep.  - Increase tirzepatide (MOUNJARO) 10 MG/0.5ML Pen; Inject 10 mg into the skin once a week.  Dispense: 2 mL; Refill: 0  2. Chronic constipation This problem is controlled with Linzess 290 mcg daily.  The current medical regimen is effective;  continue present plan and medications.  3. Essential hypertension At goal. Medications: Hyzaar 100-25 mg daily.   Plan:  Continue Hyzaar.  Avoid buying foods that are: processed, frozen, or prepackaged to avoid excess salt. We will continue to monitor closely alongside her PCP and/or Specialist.    BP Readings from Last 3 Encounters:  06/14/21 110/73  05/25/21 110/74  04/25/21  93/65   Lab Results  Component Value Date   CREATININE 0.8 03/24/2021   4. Obesity with current BMI 31.6  Course: [name] is currently in the action stage of change. As such, her goal is to continue with weight loss efforts.   Nutrition goals: She has agreed to Category 1 Plan and keeping a food journal and adhering to recommended goals of 300-400 calories and 35 grams of protein at supper daily.   Exercise goals: For substantial health benefits, adults should do at least 150 minutes (2 hours and 30 minutes) a week of moderate-intensity, or 75 minutes (1 hour and 15 minutes) a week of vigorous-intensity aerobic physical activity, or an equivalent combination of moderate- and vigorous-intensity aerobic activity. Aerobic activity should be performed in episodes of at least 10 minutes, and preferably, it should be spread throughout the week.  Behavioral modification strategies: increasing lean protein intake, decreasing simple carbohydrates, increasing vegetables, and increasing water intake.  Cadi has agreed to follow-up with our clinic in 4 weeks. She was informed of the importance of frequent follow-up visits to maximize her success with intensive lifestyle modifications for her multiple health conditions.   Objective:   Blood pressure 110/73, pulse 76, temperature 98 F (36.7 C), temperature source Oral, height 5\' 4"  (1.626 m), weight 184 lb (83.5 kg), SpO2 98 %. Body mass index is 31.58 kg/m.  General: Cooperative, alert, well developed, in no acute distress. HEENT: Conjunctivae and lids unremarkable. Cardiovascular: Regular rhythm.  Lungs: Normal work of breathing. Neurologic: No focal deficits.   Lab Results  Component Value Date   CREATININE 0.8 03/24/2021   BUN 15 03/24/2021  NA 144 03/24/2021   K 3.7 03/24/2021   CL 104 03/24/2021   CO2 24 (A) 03/24/2021   Lab Results  Component Value Date   ALT 17 03/24/2021   AST 18 03/24/2021   ALKPHOS 73 03/24/2021   BILITOT  0.3 10/16/2019   Lab Results  Component Value Date   HGBA1C 5.7 03/24/2021   HGBA1C 5.9 (H) 10/16/2019   Lab Results  Component Value Date   INSULIN 10.1 10/16/2019   Lab Results  Component Value Date   TSH 2.25 10/18/2020   Lab Results  Component Value Date   CHOL 180 03/24/2021   HDL 42 03/24/2021   LDLCALC 125 03/24/2021   TRIG 69 03/24/2021   Lab Results  Component Value Date   VD25OH 44.0 10/16/2019   Lab Results  Component Value Date   WBC 5.9 10/18/2020   HGB 14.3 10/18/2020   HCT 43 10/18/2020   MCV 85 10/16/2019   PLT 240 10/18/2020   Lab Results  Component Value Date   IRON 45 10/16/2019   TIBC 237 (L) 10/16/2019   FERRITIN 241 (H) 10/16/2019   Attestation Statements:   Reviewed by clinician on day of visit: allergies, medications, problem list, medical history, surgical history, family history, social history, and previous encounter notes.  I, Water quality scientist, CMA, am acting as transcriptionist for Briscoe Deutscher, DO  I have reviewed the above documentation for accuracy and completeness, and I agree with the above. -  Briscoe Deutscher, DO, MS, FAAFP, DABOM - Family and Bariatric Medicine.

## 2021-06-19 ENCOUNTER — Other Ambulatory Visit: Payer: Self-pay | Admitting: Allergy and Immunology

## 2021-06-19 DIAGNOSIS — J4550 Severe persistent asthma, uncomplicated: Secondary | ICD-10-CM

## 2021-06-22 ENCOUNTER — Encounter: Payer: Self-pay | Admitting: Family Medicine

## 2021-06-27 ENCOUNTER — Other Ambulatory Visit: Payer: Self-pay | Admitting: Family Medicine

## 2021-06-27 DIAGNOSIS — I10 Essential (primary) hypertension: Secondary | ICD-10-CM

## 2021-06-27 NOTE — Telephone Encounter (Signed)
Requested medication (s) are due for refill today: yes  Requested medication (s) are on the active medication list: yes  Last refill:  04/18/21 (mail order)  Future visit scheduled: 10/2021  Notes to clinic:  pt was asked to return in 6 months but pt canceled 6 month follow up appt in Nov, please assess, is scheduled but not until April, 2023.   Requested Prescriptions  Pending Prescriptions Disp Refills   losartan-hydrochlorothiazide (HYZAAR) 100-25 MG tablet [Pharmacy Med Name: Losartan Potassium/Hydrochlorothiazide 100mg -25mg  Tablet] 90 tablet 0    Sig: TAKE 1 TABLET BY MOUTH DAILY     Cardiovascular: ARB + Diuretic Combos Passed - 06/27/2021 12:05 PM      Passed - K in normal range and within 180 days    Potassium  Date Value Ref Range Status  03/24/2021 3.7 3.4 - 5.3 Final          Passed - Na in normal range and within 180 days    Sodium  Date Value Ref Range Status  03/24/2021 144 137 - 147 Final          Passed - Cr in normal range and within 180 days    Creatinine  Date Value Ref Range Status  03/24/2021 0.8 0.5 - 1.1 Final   Creatinine, Ser  Date Value Ref Range Status  10/16/2019 0.80 0.57 - 1.00 mg/dL Final          Passed - Ca in normal range and within 180 days    Calcium  Date Value Ref Range Status  03/24/2021 9.8 8.7 - 10.7 Final          Passed - Patient is not pregnant      Passed - Last BP in normal range    BP Readings from Last 1 Encounters:  06/14/21 110/73          Passed - Valid encounter within last 6 months    Recent Outpatient Visits           5 months ago Essential hypertension   [facility] Jerrol Banana., MD   1 year ago Pneumonia due to COVID-19 virus   [facility] Rosanna Randy, Retia Passe., MD   1 year ago Pneumonia due to COVID-19 virus   [facility] Jerrol Banana., MD   1 year ago Pneumonia due to COVID-19 virus   [facility] Jerrol Banana., MD   1 year ago Mild intermittent asthmatic bronchitis without complication   [facility] Trinna Post, [place]       Future Appointments             In 3 months Jerrol Banana., MD [facility], [place]

## 2021-07-05 DIAGNOSIS — K581 Irritable bowel syndrome with constipation: Secondary | ICD-10-CM | POA: Diagnosis not present

## 2021-07-09 ENCOUNTER — Other Ambulatory Visit (INDEPENDENT_AMBULATORY_CARE_PROVIDER_SITE_OTHER): Payer: Self-pay | Admitting: Family Medicine

## 2021-07-09 DIAGNOSIS — R7301 Impaired fasting glucose: Secondary | ICD-10-CM

## 2021-07-12 ENCOUNTER — Ambulatory Visit (INDEPENDENT_AMBULATORY_CARE_PROVIDER_SITE_OTHER): Payer: BC Managed Care – PPO | Admitting: Family Medicine

## 2021-07-12 ENCOUNTER — Encounter (INDEPENDENT_AMBULATORY_CARE_PROVIDER_SITE_OTHER): Payer: Self-pay | Admitting: Family Medicine

## 2021-07-12 ENCOUNTER — Other Ambulatory Visit (INDEPENDENT_AMBULATORY_CARE_PROVIDER_SITE_OTHER): Payer: Self-pay | Admitting: Family Medicine

## 2021-07-12 ENCOUNTER — Other Ambulatory Visit: Payer: Self-pay

## 2021-07-12 VITALS — BP 98/67 | HR 68 | Temp 97.6°F | Ht 64.0 in | Wt 177.0 lb

## 2021-07-12 DIAGNOSIS — E8881 Metabolic syndrome: Secondary | ICD-10-CM

## 2021-07-12 DIAGNOSIS — R7301 Impaired fasting glucose: Secondary | ICD-10-CM

## 2021-07-12 DIAGNOSIS — F4321 Adjustment disorder with depressed mood: Secondary | ICD-10-CM | POA: Diagnosis not present

## 2021-07-12 DIAGNOSIS — Z6837 Body mass index (BMI) 37.0-37.9, adult: Secondary | ICD-10-CM

## 2021-07-12 MED ORDER — TIRZEPATIDE 10 MG/0.5ML ~~LOC~~ SOAJ: 10.0000 mg | SUBCUTANEOUS | 0 refills | Status: AC

## 2021-07-12 NOTE — Progress Notes (Signed)
Chief Complaint:   OBESITY Gabrielle Terry is here to discuss her progress with her obesity treatment plan along with follow-up of her obesity related diagnoses. See Medical Weight Management Flowsheet for complete bioelectrical impedance results.  Today's visit was #: 75 Starting weight: 209 lbs Starting date: 10/16/2019 Weight change since last visit: 7 lbs Total lbs lost to date: 32 lbs Total weight loss percentage to date: -15.31%  Nutrition Plan: Category 1 plan with Keeping a food journal and adhering to recommended goals of 300-400 calories and 35 grams of protein with supper for 30% of the time. Activity: None. Anti-obesity medications: Mounjaro 10 mg subcutaneously weekly. Reported side effects: None.  Interim History: Gabrielle Terry had labs with [facility].  She will see her PCP in 2023.  She sees her GYN on January 11 at [facility].  She says that her mother died earlier this month.  She is on Mounjaro 10 mg.  Will refill for 3 months.  Okay to decrease Hyzaar to 1/2 tablet daily.  Assessment/Plan:   1. Impaired fasting glucose, with polyphagia Controlled. Current treatment: Mounjaro 10 mg subcutaneously weekly.    Plan: She will continue to focus on protein-rich, low simple carbohydrate foods. We reviewed the importance of hydration, regular exercise for stress reduction, and restorative sleep.  - Refill tirzepatide (MOUNJARO) 10 MG/0.5ML Pen; Inject 10 mg into the skin once a week.  Dispense: 6 mL; Refill: 0  2. Metabolic syndrome Starting goal: Lose 7-10% of starting weight. She will continue to focus on protein-rich, low simple carbohydrate foods. We reviewed the importance of hydration, regular exercise for stress reduction, and restorative sleep.  We will continue to check lab work every 3 months, with 10% weight loss, or should any other concerns arise.  3. Grief Gabrielle Terry's mother died earlier this month. Offered therapy and other help if needed.   4. Obesity with  current BMI 30.4  Course: Gabrielle Terry is currently in the action stage of change. As such, her goal is to continue with weight loss efforts.   Nutrition goals: She has agreed to the Category 1 Plan and keeping a food journal and adhering to recommended goals of 300-400 calories and 35 grams of protein at supper.   Exercise goals: For substantial health benefits, adults should do at least 150 minutes (2 hours and 30 minutes) a week of moderate-intensity, or 75 minutes (1 hour and 15 minutes) a week of vigorous-intensity aerobic physical activity, or an equivalent combination of moderate- and vigorous-intensity aerobic activity. Aerobic activity should be performed in episodes of at least 10 minutes, and preferably, it should be spread throughout the week.  Behavioral modification strategies: increasing lean protein intake, decreasing simple carbohydrates, increasing vegetables, and increasing water intake.  Gabrielle Terry has agreed to follow-up with our clinic in 4 weeks. She was informed of the importance of frequent follow-up visits to maximize her success with intensive lifestyle modifications for her multiple health conditions.   Objective:   Blood pressure 98/67, pulse 68, temperature 97.6 F (36.4 C), temperature source Oral, height 5\' 4"  (1.626 m), weight 177 lb (80.3 kg), SpO2 99 %. Body mass index is 30.38 kg/m.  General: Cooperative, alert, well developed, in no acute distress. HEENT: Conjunctivae and lids unremarkable. Cardiovascular: Regular rhythm.  Lungs: Normal work of breathing. Neurologic: No focal deficits.   Lab Results  Component Value Date   CREATININE 0.8 03/24/2021   BUN 15 03/24/2021   NA 144 03/24/2021   K 3.7 03/24/2021   CL 104  03/24/2021   CO2 24 (A) 03/24/2021   Lab Results  Component Value Date   ALT 17 03/24/2021   AST 18 03/24/2021   ALKPHOS 73 03/24/2021   BILITOT 0.3 10/16/2019   Lab Results  Component Value Date   HGBA1C 5.7 03/24/2021   HGBA1C 5.9  (H) 10/16/2019   Lab Results  Component Value Date   INSULIN 10.1 10/16/2019   Lab Results  Component Value Date   TSH 2.25 10/18/2020   Lab Results  Component Value Date   CHOL 180 03/24/2021   HDL 42 03/24/2021   LDLCALC 125 03/24/2021   TRIG 69 03/24/2021   Lab Results  Component Value Date   VD25OH 44.0 10/16/2019   Lab Results  Component Value Date   WBC 5.9 10/18/2020   HGB 14.3 10/18/2020   HCT 43 10/18/2020   MCV 85 10/16/2019   PLT 240 10/18/2020   Lab Results  Component Value Date   IRON 45 10/16/2019   TIBC 237 (L) 10/16/2019   FERRITIN 241 (H) 10/16/2019   Attestation Statements:   Reviewed by clinician on day of visit: allergies, medications, problem list, medical history, surgical history, family history, social history, and previous encounter notes.  I, Water quality scientist, CMA, am acting as transcriptionist for Briscoe Deutscher, DO  I have reviewed the above documentation for accuracy and completeness, and I agree with the above. -  Briscoe Deutscher, DO, MS, FAAFP, DABOM - Family and Bariatric Medicine.

## 2021-07-12 NOTE — Telephone Encounter (Signed)
Dr.Wallace °

## 2021-07-28 DIAGNOSIS — M1711 Unilateral primary osteoarthritis, right knee: Secondary | ICD-10-CM | POA: Diagnosis not present

## 2021-07-28 DIAGNOSIS — M25561 Pain in right knee: Secondary | ICD-10-CM | POA: Diagnosis not present

## 2021-07-28 DIAGNOSIS — G8929 Other chronic pain: Secondary | ICD-10-CM | POA: Diagnosis not present

## 2021-08-03 DIAGNOSIS — Z01419 Encounter for gynecological examination (general) (routine) without abnormal findings: Secondary | ICD-10-CM | POA: Diagnosis not present

## 2021-08-03 DIAGNOSIS — Z1389 Encounter for screening for other disorder: Secondary | ICD-10-CM | POA: Diagnosis not present

## 2021-08-03 DIAGNOSIS — Z130 Encounter for screening for diseases of the blood and blood-forming organs and certain disorders involving the immune mechanism: Secondary | ICD-10-CM | POA: Diagnosis not present

## 2021-08-03 DIAGNOSIS — Z6829 Body mass index (BMI) 29.0-29.9, adult: Secondary | ICD-10-CM | POA: Diagnosis not present

## 2021-08-10 ENCOUNTER — Ambulatory Visit (INDEPENDENT_AMBULATORY_CARE_PROVIDER_SITE_OTHER): Payer: BC Managed Care – PPO | Admitting: Family Medicine

## 2021-08-10 ENCOUNTER — Other Ambulatory Visit: Payer: Self-pay

## 2021-08-10 ENCOUNTER — Encounter (INDEPENDENT_AMBULATORY_CARE_PROVIDER_SITE_OTHER): Payer: Self-pay | Admitting: Family Medicine

## 2021-08-10 VITALS — BP 108/74 | HR 70 | Temp 97.9°F | Ht 64.0 in | Wt 174.0 lb

## 2021-08-10 DIAGNOSIS — R7301 Impaired fasting glucose: Secondary | ICD-10-CM | POA: Diagnosis not present

## 2021-08-10 DIAGNOSIS — G8929 Other chronic pain: Secondary | ICD-10-CM

## 2021-08-10 DIAGNOSIS — Z6837 Body mass index (BMI) 37.0-37.9, adult: Secondary | ICD-10-CM

## 2021-08-10 DIAGNOSIS — Z6830 Body mass index (BMI) 30.0-30.9, adult: Secondary | ICD-10-CM

## 2021-08-10 DIAGNOSIS — I10 Essential (primary) hypertension: Secondary | ICD-10-CM

## 2021-08-10 DIAGNOSIS — E669 Obesity, unspecified: Secondary | ICD-10-CM

## 2021-08-10 DIAGNOSIS — M25561 Pain in right knee: Secondary | ICD-10-CM | POA: Diagnosis not present

## 2021-08-10 NOTE — Progress Notes (Signed)
Chief Complaint:   OBESITY Gabrielle Terry is here to discuss her progress with her obesity treatment plan along with follow-up of her obesity related diagnoses. See Medical Weight Management Flowsheet for complete bioelectrical impedance results.  Today's visit was #: 98 Starting weight: 209 lbs Starting date: 10/16/2019 Weight change since last visit: 3 lbs Total lbs lost to date: 35 lbs Total weight loss percentage to date: -16.75%  Nutrition Plan: Category 1 Plan for 35% of the time.  Activity: None. Anti-obesity medications: Mounjaro 10 mg subcutaneously weekly. Reported side effects: None.  Interim History: Gabrielle Terry says she saw the orthopedist for her bilateral knee pain.  I reviewed the note.  She has not decreased her Hyzaar yet.  She has not taken her medication yet today.  Assessment/Plan:   1. Impaired fasting glucose, with polyphagia Controlled. Current treatment: Mounjaro 10 mg subcutaneously weekly.    Plan:  Continue Mounjaro 10 mg subcutaneously weekly.  She will continue to focus on protein-rich, low simple carbohydrate foods. We reviewed the importance of hydration, regular exercise for stress reduction, and restorative sleep.  2. Essential hypertension Low today. Medications: Hyzaar 100-25 mg daily.   Plan:  Decrease Hyzaar by 1/2 tablet.  Avoid buying foods that are: processed, frozen, or prepackaged to avoid excess salt. We will watch for signs of hypotension as she continues lifestyle modifications.  BP Readings from Last 3 Encounters:  08/10/21 108/74  07/12/21 98/67  06/14/21 110/73   Lab Results  Component Value Date   CREATININE 0.8 03/24/2021   3. Chronic pain of right knee We discussed exercises today. We will continue to monitor symptoms as they relate to her weight loss journey.  4. Obesity with current BMI 30  Course: Gabrielle Terry is currently in the action stage of change. As such, her goal is to continue with weight loss efforts.   Nutrition  goals: She has agreed to practicing portion control and making smarter food choices, such as increasing vegetables and decreasing simple carbohydrates.   Exercise goals:  Chair exercises.  Behavioral modification strategies: increasing lean protein intake, decreasing simple carbohydrates, increasing vegetables, and increasing water intake.  Gabrielle Terry has agreed to follow-up with our clinic in 4 weeks. She was informed of the importance of frequent follow-up visits to maximize her success with intensive lifestyle modifications for her multiple health conditions.   Objective:   Blood pressure 108/74, pulse 70, temperature 97.9 F (36.6 C), temperature source Oral, height 5\' 4"  (1.626 m), weight 174 lb (78.9 kg), SpO2 98 %. Body mass index is 29.87 kg/m.  General: Cooperative, alert, well developed, in no acute distress. HEENT: Conjunctivae and lids unremarkable. Cardiovascular: Regular rhythm.  Lungs: Normal work of breathing. Neurologic: No focal deficits.   Lab Results  Component Value Date   CREATININE 0.8 03/24/2021   BUN 15 03/24/2021   NA 144 03/24/2021   K 3.7 03/24/2021   CL 104 03/24/2021   CO2 24 (A) 03/24/2021   Lab Results  Component Value Date   ALT 17 03/24/2021   AST 18 03/24/2021   ALKPHOS 73 03/24/2021   BILITOT 0.3 10/16/2019   Lab Results  Component Value Date   HGBA1C 5.7 03/24/2021   HGBA1C 5.9 (H) 10/16/2019   Lab Results  Component Value Date   INSULIN 10.1 10/16/2019   Lab Results  Component Value Date   TSH 2.25 10/18/2020   Lab Results  Component Value Date   CHOL 180 03/24/2021   HDL 42 03/24/2021   LDLCALC  125 03/24/2021   TRIG 69 03/24/2021   Lab Results  Component Value Date   VD25OH 44.0 10/16/2019   Lab Results  Component Value Date   WBC 5.9 10/18/2020   HGB 14.3 10/18/2020   HCT 43 10/18/2020   MCV 85 10/16/2019   PLT 240 10/18/2020   Lab Results  Component Value Date   IRON 45 10/16/2019   TIBC 237 (L) 10/16/2019    FERRITIN 241 (H) 10/16/2019   Attestation Statements:   Reviewed by clinician on day of visit: allergies, medications, problem list, medical history, surgical history, family history, social history, and previous encounter notes.  I, Water quality scientist, CMA, am acting as transcriptionist for Briscoe Deutscher, DO  I have reviewed the above documentation for accuracy and completeness, and I agree with the above. -  Briscoe Deutscher, DO, MS, FAAFP, DABOM - Family and Bariatric Medicine.

## 2021-08-13 DIAGNOSIS — R7303 Prediabetes: Secondary | ICD-10-CM | POA: Diagnosis not present

## 2021-09-01 ENCOUNTER — Ambulatory Visit (INDEPENDENT_AMBULATORY_CARE_PROVIDER_SITE_OTHER): Payer: BC Managed Care – PPO | Admitting: Family Medicine

## 2021-09-01 ENCOUNTER — Encounter (INDEPENDENT_AMBULATORY_CARE_PROVIDER_SITE_OTHER): Payer: Self-pay | Admitting: Family Medicine

## 2021-09-01 ENCOUNTER — Other Ambulatory Visit: Payer: Self-pay

## 2021-09-01 VITALS — BP 115/67 | HR 71 | Temp 98.1°F | Ht 64.0 in | Wt 171.0 lb

## 2021-09-01 DIAGNOSIS — M25561 Pain in right knee: Secondary | ICD-10-CM

## 2021-09-01 DIAGNOSIS — G8929 Other chronic pain: Secondary | ICD-10-CM

## 2021-09-01 DIAGNOSIS — I10 Essential (primary) hypertension: Secondary | ICD-10-CM | POA: Diagnosis not present

## 2021-09-01 DIAGNOSIS — R7301 Impaired fasting glucose: Secondary | ICD-10-CM

## 2021-09-01 DIAGNOSIS — Z6834 Body mass index (BMI) 34.0-34.9, adult: Secondary | ICD-10-CM

## 2021-09-01 DIAGNOSIS — Z6829 Body mass index (BMI) 29.0-29.9, adult: Secondary | ICD-10-CM

## 2021-09-01 DIAGNOSIS — E669 Obesity, unspecified: Secondary | ICD-10-CM

## 2021-09-01 MED ORDER — TIRZEPATIDE 10 MG/0.5ML ~~LOC~~ SOAJ: 10.0000 mg | SUBCUTANEOUS | 2 refills | Status: DC

## 2021-09-05 ENCOUNTER — Ambulatory Visit (INDEPENDENT_AMBULATORY_CARE_PROVIDER_SITE_OTHER): Payer: BC Managed Care – PPO | Admitting: Family Medicine

## 2021-09-05 NOTE — Progress Notes (Signed)
Chief Complaint:   OBESITY Gabrielle Terry is here to discuss her progress with her obesity treatment plan along with follow-up of her obesity related diagnoses. See Medical Weight Management Flowsheet for complete bioelectrical impedance results.  Today's visit was #: 28 Starting weight: 209 lbs Starting date: 10/16/2019 Weight change since last visit: 3 lbs Total lbs lost to date: 38 lbs Total weight loss percentage to date: -18.18%  Nutrition Plan: Practicing portion control and making smarter food choices, such as increasing vegetables and decreasing simple carbohydrates for 100% of the time. Activity: HIIT for 45 minutes 3 times per week.  Anti-obesity medications: Mounjaro 10 mg subcutaneously weekly. Reported side effects: None.  Interim History: Gabrielle Terry says she will see her PCP in April.  She says that both of her knees are okay.  She is exercising carefully.  She has a [facility] appointment in the spring.  Assessment/Plan:   1. Impaired fasting glucose, with polyphagia Controlled. Current treatment: Mounjaro 10 mg subcutaneously weekly.    Plan: Continue Mounjaro 10 mg subcutaneously weekly.  Will refill today.  She will continue to focus on protein-rich, low simple carbohydrate foods. We reviewed the importance of hydration, regular exercise for stress reduction, and restorative sleep.  - Refill tirzepatide (MOUNJARO) 10 MG/0.5ML Pen; Inject 10 mg into the skin once a week.  Dispense: 6 mL; Refill: 2  2. Chronic pain of right knee Will follow because mobility and pain control are important for weight management.  3. Essential hypertension At goal. She has not taken her medication today.  Medications: Hyzaar 100-25 mg daily.   Plan: Avoid buying foods that are: processed, frozen, or prepackaged to avoid excess salt. We will watch for signs of hypotension as she continues lifestyle modifications.  BP Readings from Last 3 Encounters:  09/01/21 115/67  08/10/21 108/74   07/12/21 98/67   Lab Results  Component Value Date   CREATININE 0.8 03/24/2021   4. Obesity, current BMI 29.4  Course: Gabrielle Terry is currently in the action stage of change. As such, her goal is to continue with weight loss efforts.   Nutrition goals: She has agreed to practicing portion control and making smarter food choices, such as increasing vegetables and decreasing simple carbohydrates.   Exercise goals:  As is.  Behavioral modification strategies: increasing lean protein intake, decreasing simple carbohydrates, increasing vegetables, and increasing water intake.  Gabrielle Terry has agreed to follow-up with our clinic in 4 weeks. She was informed of the importance of frequent follow-up visits to maximize her success with intensive lifestyle modifications for her multiple health conditions.   Objective:   Blood pressure 115/67, pulse 71, temperature 98.1 F (36.7 C), temperature source Oral, height 5\' 4"  (1.626 m), weight 171 lb (77.6 kg), SpO2 98 %. Body mass index is 29.35 kg/m.  General: Cooperative, alert, well developed, in no acute distress. HEENT: Conjunctivae and lids unremarkable. Cardiovascular: Regular rhythm.  Lungs: Normal work of breathing. Neurologic: No focal deficits.   Lab Results  Component Value Date   CREATININE 0.8 03/24/2021   BUN 15 03/24/2021   NA 144 03/24/2021   K 3.7 03/24/2021   CL 104 03/24/2021   CO2 24 (A) 03/24/2021   Lab Results  Component Value Date   ALT 17 03/24/2021   AST 18 03/24/2021   ALKPHOS 73 03/24/2021   BILITOT 0.3 10/16/2019   Lab Results  Component Value Date   HGBA1C 5.7 03/24/2021   HGBA1C 5.9 (H) 10/16/2019   Lab Results  Component Value  Date   INSULIN 10.1 10/16/2019   Lab Results  Component Value Date   TSH 2.25 10/18/2020   Lab Results  Component Value Date   CHOL 180 03/24/2021   HDL 42 03/24/2021   LDLCALC 125 03/24/2021   TRIG 69 03/24/2021   Lab Results  Component Value Date   VD25OH 44.0  10/16/2019   Lab Results  Component Value Date   WBC 5.9 10/18/2020   HGB 14.3 10/18/2020   HCT 43 10/18/2020   MCV 85 10/16/2019   PLT 240 10/18/2020   Lab Results  Component Value Date   IRON 45 10/16/2019   TIBC 237 (L) 10/16/2019   FERRITIN 241 (H) 10/16/2019   Attestation Statements:   Reviewed by clinician on day of visit: allergies, medications, problem list, medical history, surgical history, family history, social history, and previous encounter notes.  I, Water quality scientist, CMA, am acting as transcriptionist for Briscoe Deutscher, DO  I have reviewed the above documentation for accuracy and completeness, and I agree with the above. -  Briscoe Deutscher, DO, MS, FAAFP, DABOM - Family and Bariatric Medicine.

## 2021-09-06 ENCOUNTER — Encounter (INDEPENDENT_AMBULATORY_CARE_PROVIDER_SITE_OTHER): Payer: Self-pay | Admitting: Family Medicine

## 2021-09-06 ENCOUNTER — Encounter (INDEPENDENT_AMBULATORY_CARE_PROVIDER_SITE_OTHER): Payer: Self-pay

## 2021-09-06 DIAGNOSIS — R7301 Impaired fasting glucose: Secondary | ICD-10-CM

## 2021-09-07 ENCOUNTER — Other Ambulatory Visit: Payer: Self-pay | Admitting: Family Medicine

## 2021-09-07 MED ORDER — TIRZEPATIDE 10 MG/0.5ML ~~LOC~~ SOAJ: 10.0000 mg | SUBCUTANEOUS | 2 refills | Status: DC

## 2021-09-07 MED ORDER — LOVASTATIN 20 MG PO TABS: ORAL_TABLET | ORAL | 1 refills | Status: DC

## 2021-09-07 NOTE — Telephone Encounter (Signed)
Requested Prescriptions  Pending Prescriptions Disp Refills   lovastatin (MEVACOR) 20 MG tablet 90 tablet 1    Sig: TAKE 1 TABLET(20 MG) BY MOUTH DAILY Strength: 20 mg     Cardiovascular:  Antilipid - Statins 2 Failed - 09/07/2021 12:07 PM      Failed - Lipid Panel in normal range within the last 12 months    Cholesterol, Total  Date Value Ref Range Status  10/16/2019 191 100 - 199 mg/dL Final   Cholesterol  Date Value Ref Range Status  03/24/2021 180 0 - 200 Final   LDL Chol Calc (NIH)  Date Value Ref Range Status  10/16/2019 131 (H) 0 - 99 mg/dL Final   LDL Cholesterol  Date Value Ref Range Status  03/24/2021 125  Final   HDL  Date Value Ref Range Status  03/24/2021 42 35 - 70 Final  10/16/2019 50 >39 mg/dL Final   Triglycerides  Date Value Ref Range Status  03/24/2021 69 40 - 160 Final         Passed - Cr in normal range and within 360 days    Creatinine  Date Value Ref Range Status  03/24/2021 0.8 0.5 - 1.1 Final   Creatinine, Ser  Date Value Ref Range Status  10/16/2019 0.80 0.57 - 1.00 mg/dL Final         Passed - Patient is not pregnant      Passed - Valid encounter within last 12 months    Recent Outpatient Visits          7 months ago Essential hypertension   [facility] Jerrol Banana., MD   1 year ago Pneumonia due to COVID-19 virus   [facility] Jerrol Banana., MD   1 year ago Pneumonia due to COVID-19 virus   [facility] Randy, Retia Passe., MD   2 years ago Pneumonia due to COVID-19 virus   [facility] Jerrol Banana., MD   2 years ago Mild intermittent asthmatic bronchitis without complication   [facility] Trinna Post, [place]      Future Appointments            In 1 month Jerrol Banana., MD [facility], [place]

## 2021-09-07 NOTE — Telephone Encounter (Signed)
Medication Refill - Medication:   lovastatin (MEVACOR) 20 MG tablet   Has the patient contacted their pharmacy? Yes.  Pt stated no refills left.  (Agent: If no, request that the patient contact the pharmacy for the refill. If patient does not wish to contact the pharmacy document the reason why and proceed with request.) (Agent: If yes, when and what did the pharmacy advise?)  Preferred Pharmacy (with phone number or street name):   [facility] [place], [place] - [address]  [place] [address]  Phone: [phone] Fax: [phone]   Has the patient been seen for an appointment in the last year OR does the patient have an upcoming appointment? Yes.    Agent: Please be advised that RX refills may take up to 3 business days. We ask that you follow-up with your pharmacy.

## 2021-09-07 NOTE — Telephone Encounter (Signed)
Dr.Wallace °

## 2021-09-13 ENCOUNTER — Ambulatory Visit: Payer: Self-pay

## 2021-09-13 DIAGNOSIS — R7303 Prediabetes: Secondary | ICD-10-CM | POA: Diagnosis not present

## 2021-09-13 MED ORDER — LOVASTATIN 20 MG PO TABS: ORAL_TABLET | ORAL | 1 refills | Status: DC

## 2021-09-13 MED ORDER — TIRZEPATIDE 10 MG/0.5ML ~~LOC~~ SOAJ: 10.0000 mg | SUBCUTANEOUS | 2 refills | Status: DC

## 2021-09-13 NOTE — Addendum Note (Signed)
Addended by: Matilde Sprang on: 09/13/2021 11:38 AM   Modules accepted: Orders

## 2021-09-13 NOTE — Telephone Encounter (Addendum)
Medication resent electronically on 09/13/21. Patient called and inquired about it today.  Summary: medication not at pharmacy   Patient called in to inform that she have not received her medication lovastatin (MEVACOR) 20 MG tablet ordered on 09/07/21 but per snap chat Rx was printed not sent. Please advise patient is out of this medication can be reached at Ph# [phone]

## 2021-09-13 NOTE — Telephone Encounter (Signed)
Refill sent in another encounter. Patient called, left VM to return the call to the office. Her voice was no on the voicemail, so did not leave the message.  Summary: medication not at pharmacy   Patient called in to inform that she have not received her medication lovastatin (MEVACOR) 20 MG tablet ordered on 09/07/21 but per snap chat Rx was printed not sent. Please advise patient is out of this medication can be reached at Ph# [phone]

## 2021-09-13 NOTE — Addendum Note (Signed)
Addended by: Karren Cobble on: 09/13/2021 08:57 AM   Modules accepted: Orders

## 2021-09-13 NOTE — Telephone Encounter (Signed)
°  Chief Complaint: Missing Rx Symptoms: Rx was printed not sent to pharmacy Frequency:  Pertinent Negatives: Patient denies  Disposition: [] ED /[] Urgent Care (no appt availability in office) / [] Appointment(In office/virtual)/ []  [place] [facility]/ [] Home Care/ [] Refused Recommended Disposition /[] [place] [facility]/ []  Follow-up with PCP Additional Notes: Rx resent to correct pharmacy .

## 2021-09-19 NOTE — Telephone Encounter (Signed)
Dr.Wallace °

## 2021-09-20 MED ORDER — TIRZEPATIDE 12.5 MG/0.5ML ~~LOC~~ SOAJ: 12.5000 mg | SUBCUTANEOUS | 2 refills | Status: DC

## 2021-09-20 NOTE — Addendum Note (Signed)
Addended by: Karren Cobble on: 09/20/2021 11:34 AM   Modules accepted: Orders

## 2021-09-22 ENCOUNTER — Other Ambulatory Visit: Payer: Self-pay | Admitting: Family Medicine

## 2021-09-22 DIAGNOSIS — I10 Essential (primary) hypertension: Secondary | ICD-10-CM

## 2021-09-22 NOTE — Telephone Encounter (Signed)
Requested medication (s) are due for refill today: yes  ? ?Requested medication (s) are on the active medication list: yes ? ?Last refill:  06/28/21 #90 0 refills ? ?Future visit scheduled: yes in 1 month ? ?Notes to clinic:  last labs 03/24/21 , do you want courtesy refill ? ? ? ?  ?Requested Prescriptions  ?Pending Prescriptions Disp Refills  ? losartan-hydrochlorothiazide (HYZAAR) 100-25 MG tablet [Pharmacy Med Name: Losartan Potassium/Hydrochlorothiazide 190m-25mg Tablet] 90 tablet 0  ?  Sig: Take 1 tablet by mouth daily. **Appointment needed for further refills.**  ?  ? Cardiovascular: ARB + Diuretic Combos Failed - 09/22/2021 12:27 PM  ?  ?  Failed - K in normal range and within 180 days  ?  Potassium  ?Date Value Ref Range Status  ?03/24/2021 3.7 3.4 - 5.3 Final  ?  ?  ?  ?  Failed - Na in normal range and within 180 days  ?  Sodium  ?Date Value Ref Range Status  ?03/24/2021 144 137 - 147 Final  ?  ?  ?  ?  Failed - Cr in normal range and within 180 days  ?  Creatinine  ?Date Value Ref Range Status  ?03/24/2021 0.8 0.5 - 1.1 Final  ? ?Creatinine, Ser  ?Date Value Ref Range Status  ?10/16/2019 0.80 0.57 - 1.00 mg/dL Final  ?  ?  ?  ?  Failed - eGFR is 10 or above and within 180 days  ?  GFR calc Af Amer  ?Date Value Ref Range Status  ?10/16/2019 94 >59 mL/min/1.73 Final  ? ?GFR calc non Af Amer  ?Date Value Ref Range Status  ?10/16/2019 82 >59 mL/min/1.73 Final  ?  ?  ?  ?  Failed - Valid encounter within last 6 months  ?  Recent Outpatient Visits   ? ?      ? 8 months [facility]  ? 1 year [facility]19 [facility]  ? 2 years [facility]19 [facility]  ? 2 years [facility]19 [facility]  [facility]  ? 2 years ago Mild intermittent asthmatic bronchitis  without complication  ? [facility] [place] ? ?  ?  ?Future Appointments   ? ?        ? In 1 month [facility]  ? ?  ? ?  ?  ?  Passed - Patient is not pregnant  ?  ?  Passed - Last BP in normal range  ?  BP Readings from Last 1 Encounters:  ?09/01/21 115/67  ?  ?  ?  ?  ? ?

## 2021-10-06 ENCOUNTER — Encounter: Payer: Self-pay | Admitting: Family Medicine

## 2021-10-06 ENCOUNTER — Encounter (INDEPENDENT_AMBULATORY_CARE_PROVIDER_SITE_OTHER): Payer: Self-pay | Admitting: Family Medicine

## 2021-10-06 NOTE — Telephone Encounter (Signed)
Dr.Wallace °

## 2021-10-10 ENCOUNTER — Encounter (INDEPENDENT_AMBULATORY_CARE_PROVIDER_SITE_OTHER): Payer: Self-pay | Admitting: Family Medicine

## 2021-10-10 ENCOUNTER — Ambulatory Visit (INDEPENDENT_AMBULATORY_CARE_PROVIDER_SITE_OTHER): Payer: BC Managed Care – PPO | Admitting: Family Medicine

## 2021-10-10 ENCOUNTER — Other Ambulatory Visit: Payer: Self-pay

## 2021-10-10 VITALS — BP 128/85 | HR 68 | Temp 97.8°F | Ht 64.0 in | Wt 167.0 lb

## 2021-10-10 DIAGNOSIS — E669 Obesity, unspecified: Secondary | ICD-10-CM

## 2021-10-10 DIAGNOSIS — K5909 Other constipation: Secondary | ICD-10-CM | POA: Diagnosis not present

## 2021-10-10 DIAGNOSIS — R7301 Impaired fasting glucose: Secondary | ICD-10-CM | POA: Diagnosis not present

## 2021-10-10 DIAGNOSIS — I10 Essential (primary) hypertension: Secondary | ICD-10-CM

## 2021-10-10 DIAGNOSIS — Z6828 Body mass index (BMI) 28.0-28.9, adult: Secondary | ICD-10-CM

## 2021-10-10 MED ORDER — TIRZEPATIDE 10 MG/0.5ML ~~LOC~~ SOAJ: 10.0000 mg | SUBCUTANEOUS | 1 refills | Status: DC

## 2021-10-10 MED ORDER — TIRZEPATIDE 12.5 MG/0.5ML ~~LOC~~ SOAJ: 12.5000 mg | SUBCUTANEOUS | 1 refills | Status: DC

## 2021-10-11 DIAGNOSIS — R7303 Prediabetes: Secondary | ICD-10-CM | POA: Diagnosis not present

## 2021-10-13 ENCOUNTER — Telehealth (INDEPENDENT_AMBULATORY_CARE_PROVIDER_SITE_OTHER): Payer: Self-pay | Admitting: Family Medicine

## 2021-10-13 ENCOUNTER — Encounter (INDEPENDENT_AMBULATORY_CARE_PROVIDER_SITE_OTHER): Payer: Self-pay

## 2021-10-13 NOTE — Telephone Encounter (Signed)
Prior authorization denied for [facility]. Patient already uses copay card. Patient sent denial message via mychart.  ?

## 2021-10-13 NOTE — Telephone Encounter (Signed)
Prior authorization completed and denied per insurance. Patient sent mychart message.  ?

## 2021-10-18 NOTE — Progress Notes (Signed)
Chief Complaint:   OBESITY Geraline is here to discuss her progress with her obesity treatment plan along with follow-up of her obesity related diagnoses. See Medical Weight Management Flowsheet for complete bioelectrical impedance results.  Today's visit was #: 39 Starting weight: 209 lbs Starting date: 10/16/2019 Weight change since last visit: 4 lbs Total lbs lost to date: 42 lbs Total weight loss percentage to date: -20.10%  Nutrition Plan: Practicing portion control and making smarter food choices, such as increasing vegetables and decreasing simple carbohydrates for 100% of the time. Activity: HIIT for 45 minutes 3 times per week. Anti-obesity medications: Mounjaro 12.5 mg subcutaneously weekly. Reported side effects: None.  Interim History: Maizey says she is happy with her continued loss.  She is tolerating Mounjaro well.  No side effects.  Labs were reviewed.  Assessment/Plan:   1. Impaired fasting glucose, with polyphagia Controlled. Current treatment: Mounjaro 12.5 mg subcutaneously weekly.    Plan: She will continue to focus on protein-rich, low simple carbohydrate foods. We reviewed the importance of hydration, regular exercise for stress reduction, and restorative sleep.  - Refill tirzepatide (MOUNJARO) 10 MG/0.5ML Pen; Inject 10 mg into the skin once a week.  Dispense: 2 mL; Refill: 1 (preferred). - Refill tirzepatide (MOUNJARO) 12.5 MG/0.5ML Pen; Inject 12.5 mg into the skin once a week.  Dispense: 2 mL; Refill: 1  2. Essential hypertension At goal. Medications: Hyzaar 100-25 mg daily.   Plan: Avoid buying foods that are: processed, frozen, or prepackaged to avoid excess salt. We will watch for signs of hypotension as she continues lifestyle modifications.  BP Readings from Last 3 Encounters:  10/10/21 128/85  09/01/21 115/67  08/10/21 108/74   Lab Results  Component Value Date   CREATININE 0.8 03/24/2021   3. Chronic constipation Berania is taking  Linzess 290 mcg daily for constipation.  Plan:  Continue Linzess 290 mcg daily.  Bowel regimen reviewed.  4. Obesity, current BMI 28.8  Course: Zania is currently in the action stage of change. As such, her goal is to continue with weight loss efforts.   Nutrition goals: She has agreed to practicing portion control and making smarter food choices, such as increasing vegetables and decreasing simple carbohydrates.   Exercise goals:  As is.  Behavioral modification strategies: increasing lean protein intake, decreasing simple carbohydrates, and increasing vegetables.  Briyanna has agreed to follow-up with our clinic in 4 weeks. She was informed of the importance of frequent follow-up visits to maximize her success with intensive lifestyle modifications for her multiple health conditions.   Objective:   Blood pressure 128/85, pulse 68, temperature 97.8 F (36.6 C), temperature source Oral, height '5\' 4"'$  (1.626 m), weight 167 lb (75.8 kg), SpO2 97 %. Body mass index is 28.67 kg/m.  General: Cooperative, alert, well developed, in no acute distress. HEENT: Conjunctivae and lids unremarkable. Cardiovascular: Regular rhythm.  Lungs: Normal work of breathing. Neurologic: No focal deficits.   Lab Results  Component Value Date   CREATININE 0.8 03/24/2021   BUN 15 03/24/2021   NA 144 03/24/2021   K 3.7 03/24/2021   CL 104 03/24/2021   CO2 24 (A) 03/24/2021   Lab Results  Component Value Date   ALT 17 03/24/2021   AST 18 03/24/2021   ALKPHOS 73 03/24/2021   BILITOT 0.3 10/16/2019   Lab Results  Component Value Date   HGBA1C 5.7 03/24/2021   HGBA1C 5.9 (H) 10/16/2019   Lab Results  Component Value Date   INSULIN  10.1 10/16/2019   Lab Results  Component Value Date   TSH 2.25 10/18/2020   Lab Results  Component Value Date   CHOL 180 03/24/2021   HDL 42 03/24/2021   LDLCALC 125 03/24/2021   TRIG 69 03/24/2021   Lab Results  Component Value Date   VD25OH 44.0 10/16/2019    Lab Results  Component Value Date   WBC 5.9 10/18/2020   HGB 14.3 10/18/2020   HCT 43 10/18/2020   MCV 85 10/16/2019   PLT 240 10/18/2020   Lab Results  Component Value Date   IRON 45 10/16/2019   TIBC 237 (L) 10/16/2019   FERRITIN 241 (H) 10/16/2019   Attestation Statements:   Reviewed by clinician on day of visit: allergies, medications, problem list, medical history, surgical history, family history, social history, and previous encounter notes.  I, Water quality scientist, CMA, am acting as transcriptionist for Briscoe Deutscher, DO  I have reviewed the above documentation for accuracy and completeness, and I agree with the above. -  Briscoe Deutscher, DO, MS, FAAFP, DABOM - Family and Bariatric Medicine.

## 2021-10-24 ENCOUNTER — Encounter: Payer: BC Managed Care – PPO | Admitting: Family Medicine

## 2021-10-26 IMAGING — DX DG CHEST 2V
2 series · 2 of 2 positions shown · non-contrast
Comparison: August 16, 2019

CLINICAL DATA: Chest pain

EXAM:
CHEST - 2 VIEW

[chest pa]
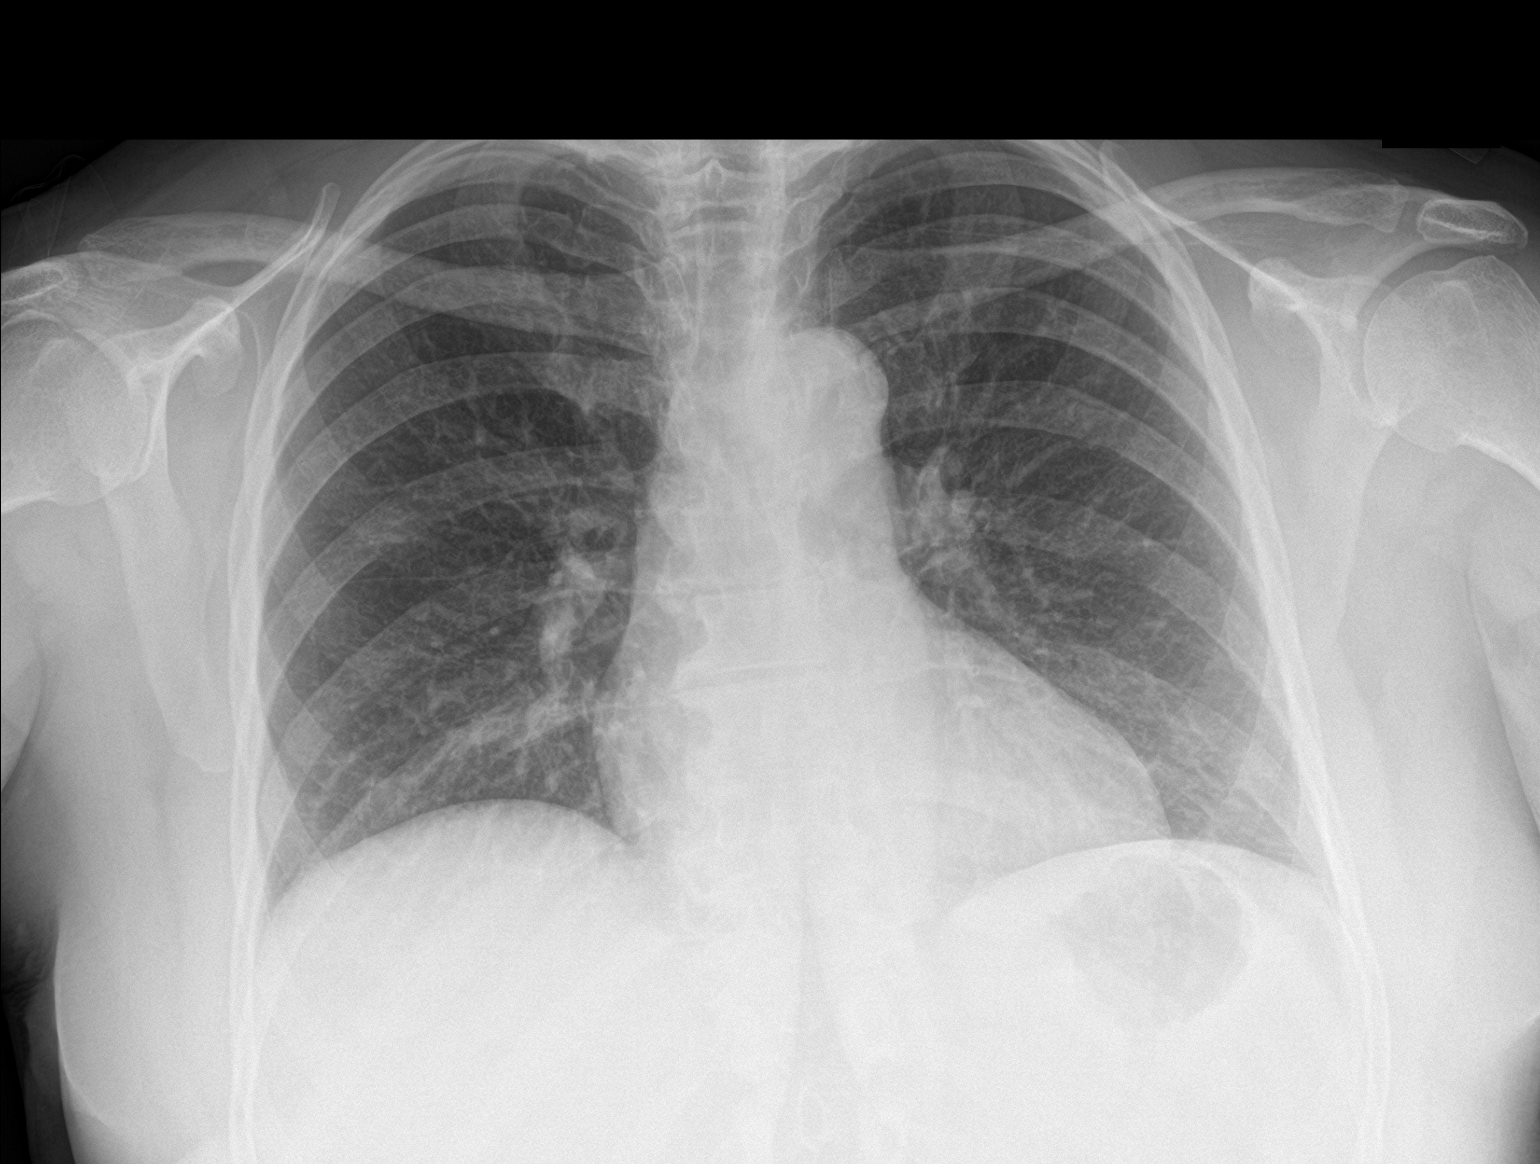

[chest lat]
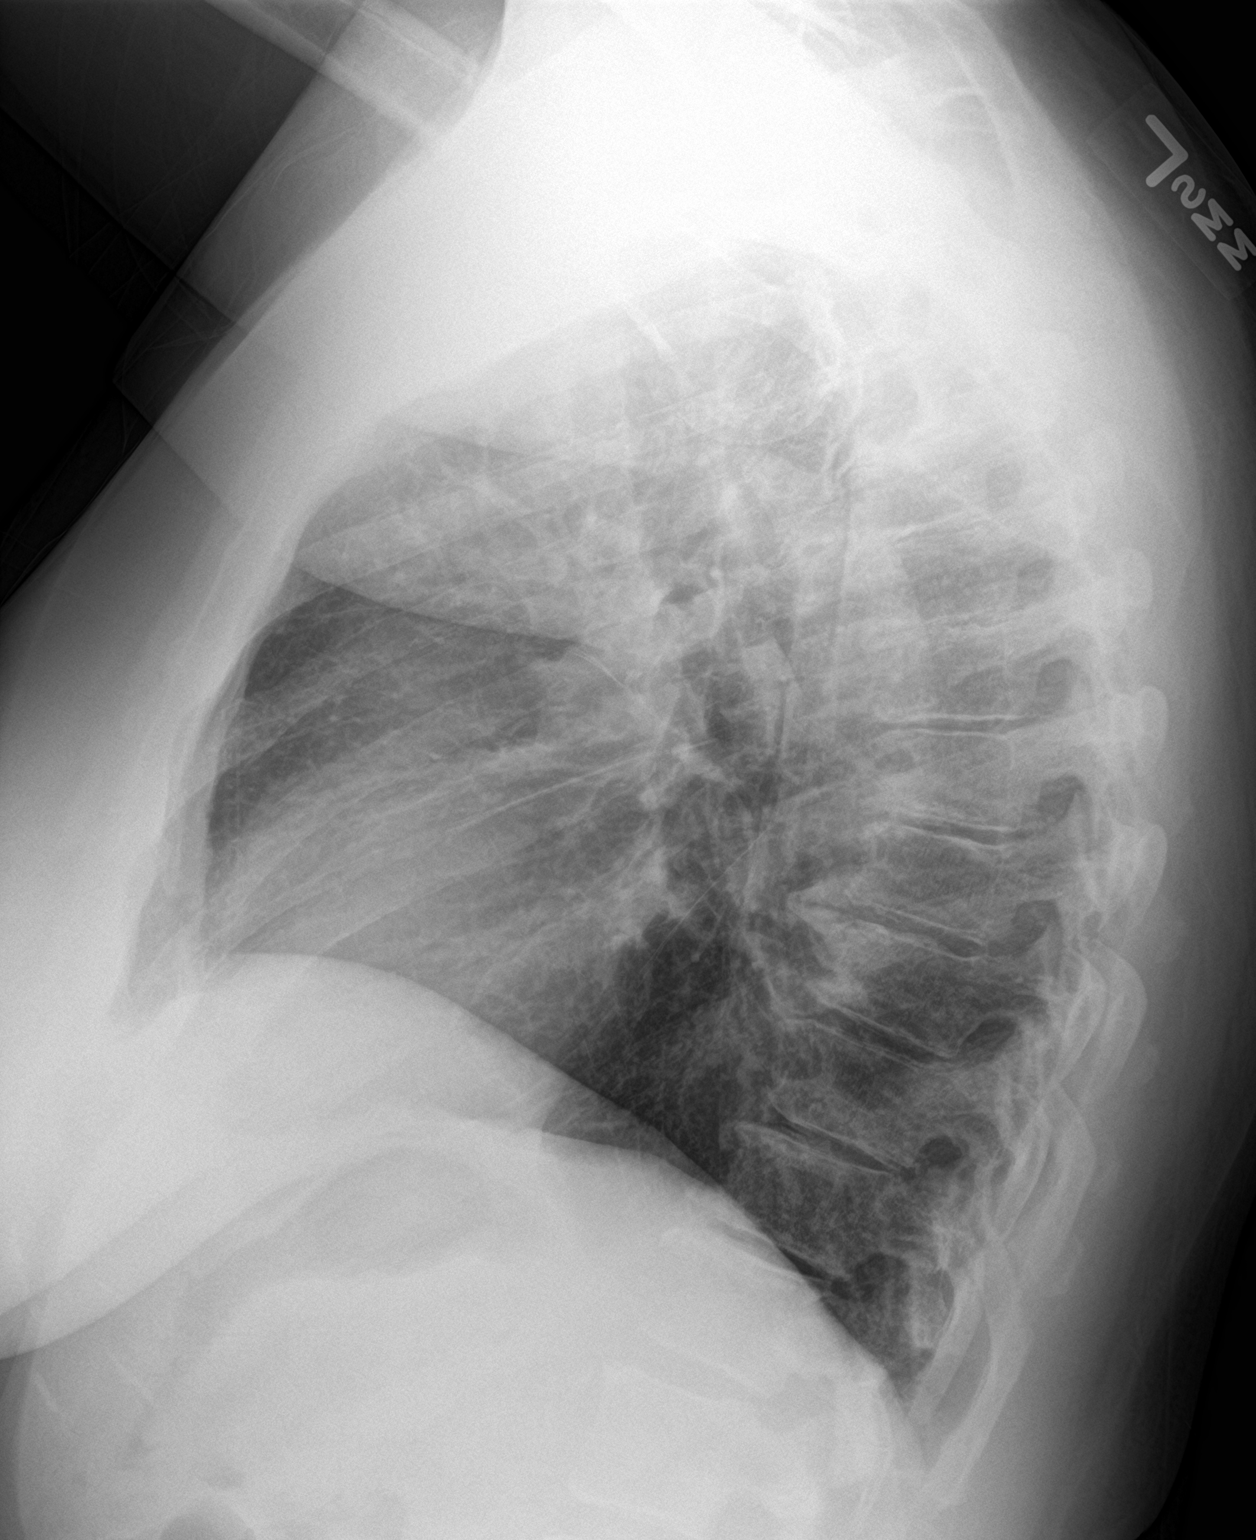

[2 of 2 positions shown; findings below may reference images not displayed]

FINDINGS: Lungs are clear. Heart size and pulmonary vascularity are normal. No
adenopathy. No pneumothorax. There is degenerative change in the mid
to lower thoracic spine.
IMPRESSION: Lungs clear.  Cardiac silhouette normal.  No adenopathy.

## 2021-10-29 ENCOUNTER — Encounter (INDEPENDENT_AMBULATORY_CARE_PROVIDER_SITE_OTHER): Payer: Self-pay | Admitting: Family Medicine

## 2021-10-29 DIAGNOSIS — R7301 Impaired fasting glucose: Secondary | ICD-10-CM

## 2021-10-31 MED ORDER — TIRZEPATIDE 7.5 MG/0.5ML ~~LOC~~ SOAJ: 7.5000 mg | SUBCUTANEOUS | 1 refills | Status: DC

## 2021-10-31 NOTE — Telephone Encounter (Signed)
Last OV with Dr Wallace 

## 2021-11-01 NOTE — Progress Notes (Signed)
? ? ? ?Complete physical exam ? ?I,April Miller,acting as a scribe for Wilhemena Durie, MD.,have documented all relevant documentation on the behalf of Wilhemena Durie, MD,as directed by  Wilhemena Durie, MD while in the presence of Wilhemena Durie, MD. ? ? ?Patient: Gabrielle Terry   DOB: October 29, 1960   61 y.o. Female  MRN: [id number] ?Visit Date: 11/02/2021 ? ?Today's healthcare provider: Wilhemena Durie, MD  ? ?Chief Complaint  ?Patient presents with  ? Annual Exam  ? ?Subjective  ?  ?Gabrielle Terry is a 61 y.o. female who presents today for a complete physical exam.  She feels great.  She is married and is enjoying her 3 grandchildren.  She has lost over than 40 pounds with the help of medical weight management.  She feels much better. ?She reports consuming a general diet. Home exercise routine includes walking and gym. She generally feels well. She reports sleeping well. She does not have additional problems to discuss today.  ?HPI  ? ?Health Maintenance ?Patient is due for Hep C screening, pap smear, mammogram and Shingles vaccine.  Her last PHQ screening/monitoring was done 01/12/21. ? ?Past Medical History:  ?Diagnosis Date  ? Arthritis   ? knees  ? Asthma 2009  ? Breast hematoma 2011  ? Right  ? Colon polyp 2013  ? 3  ? Constipation   ? Cough   ? from sinus drainage  ? COVID-19 virus infection 07/2019  ? Diffuse cystic mastopathy   ? Foot cramps   ? GERD (gastroesophageal reflux disease)   ? High cholesterol   ? Hypercholesteremia   ? Hypertension   ? Ulcer   ? ?Past Surgical History:  ?Procedure Laterality Date  ? ABDOMINAL HYSTERECTOMY  1997  ? BREAST SURGERY Right 2011  ? hematoma  ? [place]  ? COLONOSCOPY  2013  ? 3 polyps/ Dr Jamal Collin  ? COLONOSCOPY WITH PROPOFOL N/A 03/21/2017  ? Procedure: COLONOSCOPY WITH PROPOFOL;  Surgeon: Christene Lye, MD;  Location: [facility] ENDOSCOPY;  Service: Endoscopy;  Laterality: N/A;  ? ESOPHAGOGASTRODUODENOSCOPY (EGD) WITH PROPOFOL N/A  01/26/2016  ? Procedure: ESOPHAGOGASTRODUODENOSCOPY (EGD) WITH PROPOFOL;  Surgeon: Manya Silvas, MD;  Location: [facility] ENDOSCOPY;  Service: Endoscopy;  Laterality: N/A;  ? ETHMOIDECTOMY Bilateral 04/29/2015  ? Procedure: TOTAL ETHMOIDECTOMY;  Surgeon: Margaretha Sheffield, MD;  Location: [place];  Service: ENT;  Laterality: Bilateral;  ? FRONTAL SINUS EXPLORATION Bilateral 04/29/2015  ? Procedure: FRONTAL SINUS EXPLORATION;  Surgeon: Margaretha Sheffield, MD;  Location: [place];  Service: ENT;  Laterality: Bilateral;  ? IMAGE GUIDED SINUS SURGERY N/A 04/29/2015  ? Procedure: IMAGE GUIDED SINUS SURGERY;  Surgeon: Margaretha Sheffield, MD;  Location: [place];  Service: ENT;  Laterality: N/A;  GAVE DISK TO CE CE  ? MAXILLARY ANTROSTOMY Bilateral 04/29/2015  ? Procedure: MAXILLARY ANTROSTOMY WITH REMOVAL OF CONTENTS;  Surgeon: Margaretha Sheffield, MD;  Location: [place];  Service: ENT;  Laterality: Bilateral;  ? MYOMECTOMY  1991  ? NASAL SEPTUM SURGERY  2013  ? salpingo oophorectmy  1997  ? SPHENOIDECTOMY Bilateral 04/29/2015  ? Procedure: SPHENOIDECTOMY;  Surgeon: Margaretha Sheffield, MD;  Location: [place];  Service: ENT;  Laterality: Bilateral;  ? TUBAL LIGATION    ? UTERINE FIBROID SURGERY    ? removed  ? ?Social History  ? ?Socioeconomic History  ? Marital status: Married  ?  Spouse name: Herb  ? Number of children: Not on file  ?  Years of education: Not on file  ? Highest education level: Not on file  ?Occupational History  ? Occupation: Equities trader  ?Tobacco Use  ? Smoking status: Never  ? Smokeless tobacco: Never  ?Substance and Sexual Activity  ? Alcohol use: No  ?  Alcohol/week: 1.0 standard drink  ?  Types: 1 Glasses of wine per week  ? Drug use: No  ? Sexual activity: Not on file  ?Other Topics Concern  ? Not on file  ?Social History Narrative  ? Not on file  ? ?Social Determinants of Health  ? ?Financial Resource Strain: Not on file  ?Food Insecurity: Not on file   ?Transportation Needs: Not on file  ?Physical Activity: Not on file  ?Stress: Not on file  ?Social Connections: Not on file  ?Intimate Partner Violence: Not on file  ? ?Family Status  ?Relation Name Status  ? Mother  Other  ?     unknown  ? Father  Deceased  ? Sister  Alive  ? Brother  Deceased at age 64  ? [place]  ? MGM  (Not Specified)  ? Son  (Not Specified)  ? Neg Hx  (Not Specified)  ? ?Family History  ?Adopted: Yes  ?Problem Relation Age of Onset  ? Congestive Heart Failure Father   ? Cancer Brother   ? Other Brother   ?     brain tumor  ? Hypertension Maternal Aunt   ? Thyroid disease Maternal Aunt   ? Diabetes Maternal Grandmother   ? Asthma Son   ? Angioedema Neg Hx   ? Allergic rhinitis Neg Hx   ? Atopy Neg Hx   ? Eczema Neg Hx   ? Immunodeficiency Neg Hx   ? Urticaria Neg Hx   ? ?No Known Allergies  ?Patient Care Team: ?Jerrol Banana., MD as PCP - General (Family Medicine) ?Christene Lye, MD (General Surgery)  ? ?Medications: ?Outpatient Medications Prior to Visit  ?Medication Sig  ? ADVAIR HFA 230-21 MCG/ACT inhaler INHALE 2 PUFFS INTO THE LUNGS TWICE DAILY  ? albuterol (VENTOLIN HFA) 108 (90 Base) MCG/ACT inhaler Inhale 2 puffs into the lungs every 6 (six) hours as needed for wheezing or shortness of breath.  ? EPINEPHrine 0.3 mg/0.3 mL IJ SOAJ injection Use as directed for life-threatening allergic reaction.  ? FASENRA PEN 30 MG/ML SOAJ Inject 30 mg into the skin every 8 (eight) weeks.  ? linaclotide (LINZESS) 290 MCG CAPS capsule Take 290 mcg by mouth daily.  ? losartan-hydrochlorothiazide (HYZAAR) 100-25 MG tablet Take 1 tablet by mouth daily. **Appointment needed for further refills.**  ? lovastatin (MEVACOR) 20 MG tablet TAKE 1 TABLET(20 MG) BY MOUTH DAILY Strength: 20 mg  ? polyethylene glycol (MIRALAX / GLYCOLAX) packet Take 17 g by mouth daily.  ? tirzepatide (MOUNJARO) 10 MG/0.5ML Pen Inject 10 mg into the skin once a week.  ? tirzepatide (MOUNJARO) 12.5 MG/0.5ML Pen  Inject 12.5 mg into the skin once a week.  ? tirzepatide (MOUNJARO) 7.5 MG/0.5ML Pen Inject 7.5 mg into the skin once a week.  ? ?Facility-Administered Medications Prior to Visit  ?Medication Dose Route Frequency Provider  ? Benralizumab SOSY 30 mg  30 mg Subcutaneous Q8 Toney Reil, MD  ? ? ?Review of Systems  ?Constitutional: Negative.   ?HENT: Negative.    ?Eyes: Negative.   ?Respiratory: Negative.    ?Cardiovascular: Negative.   ?Gastrointestinal: Negative.   ?Endocrine: Negative.   ?Genitourinary: Negative.   ?  Musculoskeletal: Negative.   ?Skin: Negative.   ?Allergic/Immunologic: Negative.   ?Neurological: Negative.   ?Hematological: Negative.   ?Psychiatric/Behavioral: Negative.    ? ?Last hemoglobin A1c ?Lab Results  ?Component Value Date  ? HGBA1C 5.7 03/24/2021  ? ?  ? Objective  ?  ?BP 123/85 (BP Location: Right Arm, Patient Position: Sitting, Cuff Size: Large)   Pulse 71   Resp 16   Wt 170 lb (77.1 kg)   SpO2 98%   BMI 29.18 kg/m?  ?BP Readings from Last 3 Encounters:  ?11/02/21 123/85  ?10/10/21 128/85  ?09/01/21 115/67  ? ?Wt Readings from Last 3 Encounters:  ?11/02/21 170 lb (77.1 kg)  ?10/10/21 167 lb (75.8 kg)  ?09/01/21 171 lb (77.6 kg)  ? ?  ? ? ?Physical Exam ?Constitutional:   ?   Appearance: Normal appearance. She is normal weight.  ?HENT:  ?   Head: Normocephalic and atraumatic.  ?   Right Ear: Tympanic membrane, ear canal and external ear normal.  ?   Left Ear: Tympanic membrane, ear canal and external ear normal.  ?   Nose: Nose normal.  ?   Mouth/Throat:  ?   Mouth: Mucous membranes are moist.  ?   Pharynx: Oropharynx is clear.  ?Eyes:  ?   Extraocular Movements: Extraocular movements intact.  ?   Conjunctiva/sclera: Conjunctivae normal.  ?   Pupils: Pupils are equal, round, and reactive to light.  ?Cardiovascular:  ?   Rate and Rhythm: Normal rate and regular rhythm.  ?   Pulses: Normal pulses.  ?   Heart sounds: Normal heart sounds.  ?Pulmonary:  ?   Effort: Pulmonary  effort is normal.  ?   Breath sounds: Normal breath sounds.  ?Abdominal:  ?   General: Abdomen is flat. Bowel sounds are normal.  ?   Palpations: Abdomen is soft.  ?Musculoskeletal:  ?   Cervical back: Normal range of m

## 2021-11-02 ENCOUNTER — Encounter (INDEPENDENT_AMBULATORY_CARE_PROVIDER_SITE_OTHER): Payer: Self-pay

## 2021-11-02 ENCOUNTER — Ambulatory Visit (INDEPENDENT_AMBULATORY_CARE_PROVIDER_SITE_OTHER): Payer: BC Managed Care – PPO | Admitting: Family Medicine

## 2021-11-02 ENCOUNTER — Encounter: Payer: Self-pay | Admitting: Family Medicine

## 2021-11-02 VITALS — BP 123/85 | HR 71 | Resp 16 | Wt 170.0 lb

## 2021-11-02 DIAGNOSIS — I10 Essential (primary) hypertension: Secondary | ICD-10-CM | POA: Diagnosis not present

## 2021-11-02 DIAGNOSIS — E7800 Pure hypercholesterolemia, unspecified: Secondary | ICD-10-CM

## 2021-11-02 DIAGNOSIS — Z0000 Encounter for general adult medical examination without abnormal findings: Secondary | ICD-10-CM | POA: Diagnosis not present

## 2021-11-02 DIAGNOSIS — Z6835 Body mass index (BMI) 35.0-35.9, adult: Secondary | ICD-10-CM

## 2021-11-02 DIAGNOSIS — E8881 Metabolic syndrome: Secondary | ICD-10-CM

## 2021-11-02 DIAGNOSIS — R7303 Prediabetes: Secondary | ICD-10-CM | POA: Diagnosis not present

## 2021-11-02 MED ORDER — ROSUVASTATIN CALCIUM 20 MG PO TABS: 20.0000 mg | ORAL_TABLET | Freq: Every day | ORAL | 3 refills | Status: DC

## 2021-11-02 NOTE — Patient Instructions (Signed)
Stop Mevacor ( Lovastatin ), and start Rosuvastatin ? ? ? ?Health Maintenance, Female ?Adopting a healthy lifestyle and getting preventive care are important in promoting health and wellness. Ask your health care provider about: ?The right schedule for you to have regular tests and exams. ?Things you can do on your own to prevent diseases and keep yourself healthy. ?What should I know about diet, weight, and exercise? ?Eat a healthy diet ? ?Eat a diet that includes plenty of vegetables, fruits, low-fat dairy products, and lean protein. ?Do not eat a lot of foods that are high in solid fats, added sugars, or sodium. ?Maintain a healthy weight ?Body mass index (BMI) is used to identify weight problems. It estimates body fat based on height and weight. Your health care provider can help determine your BMI and help you achieve or maintain a healthy weight. ?Get regular exercise ?Get regular exercise. This is one of the most important things you can do for your health. Most adults should: ?Exercise for at least 150 minutes each week. The exercise should increase your heart rate and make you sweat (moderate-intensity exercise). ?Do strengthening exercises at least twice a week. This is in addition to the moderate-intensity exercise. ?Spend less time sitting. Even light physical activity can be beneficial. ?Watch cholesterol and blood lipids ?Have your blood tested for lipids and cholesterol at 61 years of age, then have this test every 5 years. ?Have your cholesterol levels checked more often if: ?Your lipid or cholesterol levels are high. ?You are older than 61 years of age. ?You are at high risk for heart disease. ?What should I know about cancer screening? ?Depending on your health history and family history, you may need to have cancer screening at various ages. This may include screening for: ?Breast cancer. ?Cervical cancer. ?Colorectal cancer. ?Skin cancer. ?Lung cancer. ?What should I know about heart disease,  diabetes, and high blood pressure? ?Blood pressure and heart disease ?High blood pressure causes heart disease and increases the risk of stroke. This is more likely to develop in people who have high blood pressure readings or are overweight. ?Have your blood pressure checked: ?Every 3-5 years if you are 52-24 years of age. ?Every year if you are 1 years old or older. ?Diabetes ?Have regular diabetes screenings. This checks your fasting blood sugar level. Have the screening done: ?Once every three years after age 52 if you are at a normal weight and have a low risk for diabetes. ?More often and at a younger age if you are overweight or have a high risk for diabetes. ?What should I know about preventing infection? ?Hepatitis B ?If you have a higher risk for hepatitis B, you should be screened for this virus. Talk with your health care provider to find out if you are at risk for hepatitis B infection. ?Hepatitis C ?Testing is recommended for: ?Everyone born from [birth year] through 1965. ?Anyone with known risk factors for hepatitis C. ?Sexually transmitted infections (STIs) ?Get screened for STIs, including gonorrhea and chlamydia, if: ?You are sexually active and are younger than 61 years of age. ?You are older than 61 years of age and your health care provider tells you that you are at risk for this type of infection. ?Your sexual activity has changed since you were last screened, and you are at increased risk for chlamydia or gonorrhea. Ask your health care provider if you are at risk. ?Ask your health care provider about whether you are at high risk for HIV. Your health  care provider may recommend a prescription medicine to help prevent HIV infection. If you choose to take medicine to prevent HIV, you should first get tested for HIV. You should then be tested every 3 months for as long as you are taking the medicine. ?Pregnancy ?If you are about to stop having your period (premenopausal) and you may become pregnant,  seek counseling before you get pregnant. ?Take 400 to 800 micrograms (mcg) of folic acid every day if you become pregnant. ?Ask for birth control (contraception) if you want to prevent pregnancy. ?Osteoporosis and menopause ?Osteoporosis is a disease in which the bones lose minerals and strength with aging. This can result in bone fractures. If you are 77 years old or older, or if you are at risk for osteoporosis and fractures, ask your health care provider if you should: ?Be screened for bone loss. ?Take a calcium or vitamin D supplement to lower your risk of fractures. ?Be given hormone replacement therapy (HRT) to treat symptoms of menopause. ?Follow these instructions at home: ?Alcohol use ?Do not drink alcohol if: ?Your health care provider tells you not to drink. ?You are pregnant, may be pregnant, or are planning to become pregnant. ?If you drink alcohol: ?Limit how much you have to: ?0-1 drink a day. ?Know how much alcohol is in your drink. In the [place] one drink equals one 12 oz bottle of beer (355 mL), one 5 oz glass of wine (148 mL), or one 1? oz glass of hard liquor (44 mL). ?Lifestyle ?Do not use any products that contain nicotine or tobacco. These products include cigarettes, chewing tobacco, and vaping devices, such as e-cigarettes. If you need help quitting, ask your health care provider. ?Do not use street drugs. ?Do not share needles. ?Ask your health care provider for help if you need support or information about quitting drugs. ?General instructions ?Schedule regular health, dental, and eye exams. ?Stay current with your vaccines. ?Tell your health care provider if: ?You often feel depressed. ?You have ever been abused or do not feel safe at home. ?Summary ?Adopting a healthy lifestyle and getting preventive care are important in promoting health and wellness. ?Follow your health care provider's instructions about healthy diet, exercising, and getting tested or screened for diseases. ?Follow your  health care provider's instructions on monitoring your cholesterol and blood pressure. ?This information is not intended to replace advice given to you by your health care provider. Make sure you discuss any questions you have with your health care provider. ?Document Revised: 11/29/2020 Document Reviewed: 11/29/2020 ?Elsevier Patient Education ? [place]. ? ?

## 2021-11-07 ENCOUNTER — Ambulatory Visit (INDEPENDENT_AMBULATORY_CARE_PROVIDER_SITE_OTHER): Payer: BC Managed Care – PPO | Admitting: Physician Assistant

## 2021-11-08 ENCOUNTER — Encounter (INDEPENDENT_AMBULATORY_CARE_PROVIDER_SITE_OTHER): Payer: Self-pay | Admitting: Adult Health

## 2021-11-08 ENCOUNTER — Ambulatory Visit (INDEPENDENT_AMBULATORY_CARE_PROVIDER_SITE_OTHER): Payer: BC Managed Care – PPO | Admitting: Adult Health

## 2021-11-08 ENCOUNTER — Telehealth: Payer: Self-pay | Admitting: *Deleted

## 2021-11-08 VITALS — BP 122/76 | HR 67 | Temp 97.5°F | Ht 64.0 in | Wt 167.0 lb

## 2021-11-08 DIAGNOSIS — E669 Obesity, unspecified: Secondary | ICD-10-CM

## 2021-11-08 DIAGNOSIS — R7301 Impaired fasting glucose: Secondary | ICD-10-CM | POA: Diagnosis not present

## 2021-11-08 DIAGNOSIS — I10 Essential (primary) hypertension: Secondary | ICD-10-CM

## 2021-11-08 DIAGNOSIS — Z6828 Body mass index (BMI) 28.0-28.9, adult: Secondary | ICD-10-CM | POA: Diagnosis not present

## 2021-11-08 NOTE — Telephone Encounter (Signed)
/  m needs MD appt for [facility] and contact clinic ?

## 2021-11-11 DIAGNOSIS — R7303 Prediabetes: Secondary | ICD-10-CM | POA: Diagnosis not present

## 2021-11-21 ENCOUNTER — Encounter (INDEPENDENT_AMBULATORY_CARE_PROVIDER_SITE_OTHER): Payer: Self-pay

## 2021-11-21 ENCOUNTER — Telehealth (INDEPENDENT_AMBULATORY_CARE_PROVIDER_SITE_OTHER): Payer: Self-pay | Admitting: Family Medicine

## 2021-11-21 DIAGNOSIS — R7301 Impaired fasting glucose: Secondary | ICD-10-CM | POA: Insufficient documentation

## 2021-11-21 NOTE — Telephone Encounter (Signed)
Gabrielle Terry - Prior authorization denied for [facility]. Patient already uses copay card. Patient sent denial message via mychart.  ?

## 2021-11-21 NOTE — Progress Notes (Signed)
? ? ? ?Chief Complaint:  ? ?OBESITY ?Gabrielle Terry is here to discuss her progress with her obesity treatment plan along with follow-up of her obesity related diagnoses. Gabrielle Terry is on practicing portion control and making smarter food choices, such as increasing vegetables and decreasing simple carbohydrates and states she is following her eating plan approximately 40% of the time. Gabrielle Terry states she is doing cardio for 60 minutes 3 times per week. ? ?Today's visit was #: 30 ?Starting weight: 209 lbs ?Starting date: 10/16/2019 ?Today's weight: 167 lbs ?Today's date: 11/08/2021 ?Total lbs lost to date: 83 ?Total lbs lost since last in-office visit: 0 ? ?Interim History:  ?Gabrielle Terry is on Mounjaro 10 mg-injects on Friday.  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  ?Current weight 167, goal weight 165. ? ?Subjective:  ? ?1. Impaired fasting glucose ?Epic review reveals multiple elevated BG readings for last several years. ?PCP checked A1c 11/02/21- results are not released. ?Last A1c in system 03/24/21- 5.7 ?Gabrielle Terry is on Mounjaro 10 mg-injects on Friday.  ?She denies mass in neck, dysphagia, dyspepsia, persistent hoarseness, abd pain, or N/V/Constipation. ? ?2. Essential hypertension ?Gabrielle Terry's amlodipine was discontinued due to hypotension.  ?She is currently on 1/2 tablet of Hyzaar 100/25 mg.  ?BP at goal at OV- 122/76. ? ?Assessment/Plan:  ? ?1. Impaired fasting glucose ?Gabrielle Terry will continue Mounjaro '10mg'$  once week. ? ?2. Essential hypertension ?Gabrielle Terry will continue 1/2 tablet of Hyzaar 100/'25mg'$ . ? ?3. Obesity, current BMI 28.7 ?Gabrielle Terry is currently in the action stage of change. As such, her goal is to continue with weight loss efforts. She has agreed to the Category 1 Plan or practicing portion control and making smarter food choices, such as increasing vegetables and decreasing simple carbohydrates.  ? ?We will recheck IC at her next office visit, aware to be fasting and 30 minutes prior to  OV. ? ?Exercise goals: As is. ? ?Behavioral modification strategies: increasing lean protein intake, decreasing simple carbohydrates, meal planning and cooking strategies, keeping healthy foods in the home, and planning for success. ? ?Gabrielle Terry has agreed to follow-up with our clinic in 4 weeks. She was informed of the importance of frequent follow-up visits to maximize her success with intensive lifestyle modifications for her multiple health conditions.  ? ?Objective:  ? ?Blood pressure 122/76, pulse 67, temperature (!) 97.5 ?F (36.4 ?C), height '5\' 4"'$  (1.626 m), weight 167 lb (75.8 kg), SpO2 99 %. ?Body mass index is 28.67 kg/m?. ? ?General: Cooperative, alert, well developed, in no acute distress. ?HEENT: Conjunctivae and lids unremarkable. ?Cardiovascular: Regular rhythm.  ?Lungs: Normal work of breathing. ?Neurologic: No focal deficits.  ? ?Lab Results  ?Component Value Date  ? CREATININE 0.8 03/24/2021  ? BUN 15 03/24/2021  ? NA 144 03/24/2021  ? K 3.7 03/24/2021  ? CL 104 03/24/2021  ? CO2 24 (A) 03/24/2021  ? ?Lab Results  ?Component Value Date  ? ALT 17 03/24/2021  ? AST 18 03/24/2021  ? ALKPHOS 73 03/24/2021  ? BILITOT 0.3 10/16/2019  ? ?Lab Results  ?Component Value Date  ? HGBA1C 5.7 03/24/2021  ? HGBA1C 5.9 (H) 10/16/2019  ? ?Lab Results  ?Component Value Date  ? INSULIN 10.1 10/16/2019  ? ?Lab Results  ?Component Value Date  ? TSH 2.25 10/18/2020  ? ?Lab Results  ?Component Value Date  ? CHOL 180 03/24/2021  ? HDL 42 03/24/2021  ? [place] 125 03/24/2021  ? TRIG 69 03/24/2021  ? ?Lab Results  ?Component  Value Date  ? VD25OH 44.0 10/16/2019  ? ?Lab Results  ?Component Value Date  ? WBC 5.9 10/18/2020  ? HGB 14.3 10/18/2020  ? HCT 43 10/18/2020  ? MCV 85 10/16/2019  ? PLT 240 10/18/2020  ? ?Lab Results  ?Component Value Date  ? IRON 45 10/16/2019  ? TIBC 237 (L) 10/16/2019  ? FERRITIN 241 (H) 10/16/2019  ? ?Attestation Statements:  ? ?Reviewed by clinician on day of visit: allergies, medications, problem  list, medical history, surgical history, family history, social history, and previous encounter notes. ? ? ?I, Trixie Dredge, am acting as transcriptionist for Mina Marble, NP. ? ?I have reviewed the above documentation for accuracy and completeness, and I agree with the above. -  Unita Detamore d. Nahshon Reich, NP-C ? ?

## 2021-12-08 ENCOUNTER — Encounter (INDEPENDENT_AMBULATORY_CARE_PROVIDER_SITE_OTHER): Payer: Self-pay | Admitting: Adult Health

## 2021-12-08 ENCOUNTER — Ambulatory Visit (INDEPENDENT_AMBULATORY_CARE_PROVIDER_SITE_OTHER): Payer: BC Managed Care – PPO | Admitting: Adult Health

## 2021-12-08 VITALS — BP 106/74 | HR 70 | Temp 98.1°F | Ht 65.0 in | Wt 166.0 lb

## 2021-12-08 DIAGNOSIS — R7301 Impaired fasting glucose: Secondary | ICD-10-CM

## 2021-12-08 DIAGNOSIS — Z6827 Body mass index (BMI) 27.0-27.9, adult: Secondary | ICD-10-CM | POA: Diagnosis not present

## 2021-12-08 DIAGNOSIS — E669 Obesity, unspecified: Secondary | ICD-10-CM

## 2021-12-08 DIAGNOSIS — I10 Essential (primary) hypertension: Secondary | ICD-10-CM | POA: Diagnosis not present

## 2021-12-11 DIAGNOSIS — R7303 Prediabetes: Secondary | ICD-10-CM | POA: Diagnosis not present

## 2021-12-14 NOTE — Progress Notes (Unsigned)
Chief Complaint:   OBESITY Gabrielle Terry is here to discuss her progress with her obesity treatment plan along with follow-up of her obesity related diagnoses. [name] is on the Category 1 Plan and practicing portion control and making smarter food choices, such as increasing vegetables and decreasing simple carbohydrates and states she is following her eating plan approximately 50% of the time. [name] states she is strengthening, walking and biking 60 minutes 4 times per week.  Today's visit was #: 38 Starting weight: 209 lbs Starting date: 10/16/2019 Today's weight: 166 lb Today's date: 12/08/2021 Total lbs lost to date: 43 Total lbs lost since last in-office visit: 1  Interim History: [name] arrived late for IC to be completed this morning. Her typical intake- Herbal Life shake at noon. Protein with vegetables for dinner, sometimes ***. Note: Max dose of Saxenda 3 mg replaced by Mounjaro 5 mg on 03/14/2021.   Subjective:   1. Essential hypertension Meara denies symptoms of hypotension. Home blood pressure 110/70, 120/70. She is currently taking 1/2 of Losartan/HCTZ 100/25 mg. She denies lower extremity edema.  2. Impaired fasting glucose ***. Currently taking Mounjaro 10 mg every other week.  Assessment/Plan:   1. Essential hypertension Shylie is to remain well hydrated and monitor blood pressure at home.  2. Impaired fasting glucose [name] will continue taking Mounjaro 10 mg. No refills needed today. [name] has refilled multiple prescriptions.   3. Obesity, current BMI 27.8 [name] is currently in the action stage of change. As such, her goal is to continue with weight loss efforts. She has agreed to practicing portion control and making smarter food choices, such as increasing vegetables and decreasing simple carbohydrates. Increase food intake.  Exercise goals: As is.  We will check labs and IC at next appointment.  Behavioral modification strategies: increasing lean  protein intake, decreasing simple carbohydrates, meal planning and cooking strategies, keeping healthy foods in the home, and planning for success.  [name] has agreed to follow-up with our clinic in 4 weeks. She was informed of the importance of frequent follow-up visits to maximize her success with intensive lifestyle modifications for her multiple health conditions.   Objective:   Blood pressure 106/74, pulse 70, temperature 98.1 F (36.7 C), height '5\' 5"'$  (1.651 m), weight 166 lb (75.3 kg), SpO2 100 %. Body mass index is 27.62 kg/m.  General: Cooperative, alert, well developed, in no acute distress. HEENT: Conjunctivae and lids unremarkable. Cardiovascular: Regular rhythm.  Lungs: Normal work of breathing. Neurologic: No focal deficits.   Lab Results  Component Value Date   CREATININE 0.8 03/24/2021   BUN 15 03/24/2021   NA 144 03/24/2021   K 3.7 03/24/2021   CL 104 03/24/2021   CO2 24 (A) 03/24/2021   Lab Results  Component Value Date   ALT 17 03/24/2021   AST 18 03/24/2021   ALKPHOS 73 03/24/2021   BILITOT 0.3 10/16/2019   Lab Results  Component Value Date   HGBA1C 5.7 03/24/2021   HGBA1C 5.9 (H) 10/16/2019   Lab Results  Component Value Date   INSULIN 10.1 10/16/2019   Lab Results  Component Value Date   TSH 2.25 10/18/2020   Lab Results  Component Value Date   CHOL 180 03/24/2021   HDL 42 03/24/2021   LDLCALC 125 03/24/2021   TRIG 69 03/24/2021   Lab Results  Component Value Date   VD25OH 44.0 10/16/2019   Lab Results  Component Value Date   WBC 5.9 10/18/2020   HGB  14.3 10/18/2020   HCT 43 10/18/2020   MCV 85 10/16/2019   PLT 240 10/18/2020   Lab Results  Component Value Date   IRON 45 10/16/2019   TIBC 237 (L) 10/16/2019   FERRITIN 241 (H) 10/16/2019   Attestation Statements:   Reviewed by clinician on day of visit: allergies, medications, problem list, medical history, surgical history, family history, social history, and previous  encounter notes.  Time spent on visit including pre-visit chart review and post-visit care and charting was 28 minutes.   I, Yamile Roedl, RMA, am acting as transcriptionist for Mina Marble, NP.  I have reviewed the above documentation for accuracy and completeness, and I agree with the above. -  ***

## 2021-12-20 ENCOUNTER — Telehealth (INDEPENDENT_AMBULATORY_CARE_PROVIDER_SITE_OTHER): Payer: Self-pay | Admitting: Adult Health

## 2021-12-20 ENCOUNTER — Encounter (INDEPENDENT_AMBULATORY_CARE_PROVIDER_SITE_OTHER): Payer: Self-pay

## 2021-12-20 NOTE — Telephone Encounter (Signed)
Gabrielle Terry/ [name] - Prior authorization denied for [name]. Patient already uses copay card. Patient sent denial message via mychart.   *patient recently saw Gabrielle Terry/Rx was sent in for [name] prior to [name] leaving*

## 2021-12-27 NOTE — Patient Instructions (Incomplete)
Asthma Continue Advair 230-2 puffs once a day to prevent cough or wheeze Continue albuterol 2 puffs once every 4 hours as needed for cough or wheeze You may use albuterol 2 puffs 5 to 15 minutes before activity to decrease cough or wheeze Continue Fasenra injections once every 8 weeks and have access to an epinephrine auto-injector For asthma flare, increase Advair 230 to 2 puffs twice a day for 2 weeks or until cough and wheeze free, then return to previous dosing  Allergic rhinitis Continue allergen avoidance measures directed toward pollens, dust mite, and mold as listed below.   Continue an over-the-counter antihistamine once a day as needed for runny nose or itch.Remember to rotate to a different antihistamine about every 3 months. Some examples of over the counter antihistamines include Zyrtec (cetirizine), Xyzal (levocetirizine), Allegra (fexofenadine), and Claritin (loratidine).  Consider saline nasal rinses as needed for nasal symptoms. Use this before any medicated nasal sprays for best result  Stinging insect allergy Continue to avoid encounters with stinging insects. In case of an allergic reaction, take Benadryl 50 mg every 4 hours, and if life-threatening symptoms occur, inject with EpiPen 0.3 mg.  Call the clinic if this treatment plan is not working well for you  Follow up in 6 months or sooner if needed.  Reducing Pollen Exposure The American Academy of Allergy, Asthma and Immunology suggests the following steps to reduce your exposure to pollen during allergy seasons. Do not hang sheets or clothing out to dry; pollen may collect on these items. Do not mow lawns or spend time around freshly cut grass; mowing stirs up pollen. Keep windows closed at night.  Keep car windows closed while driving. Minimize morning activities outdoors, a time when pollen counts are usually at their highest. Stay indoors as much as possible when pollen counts or humidity is high and on windy days  when pollen tends to remain in the air longer. Use air conditioning when possible.  Many air conditioners have filters that trap the pollen spores. Use a HEPA room air filter to remove pollen form the indoor air you breathe.   Control of Dust Mite Allergen Dust mites play a major role in allergic asthma and rhinitis. They occur in environments with high humidity wherever human skin is found. Dust mites absorb humidity from the atmosphere (ie, they do not drink) and feed on organic matter (including shed human and animal skin). Dust mites are a microscopic type of insect that you cannot see with the naked eye. High levels of dust mites have been detected from mattresses, pillows, carpets, upholstered furniture, bed covers, clothes, soft toys and any woven material. The principal allergen of the dust mite is found in its feces. A gram of dust may contain 1,000 mites and 250,000 fecal particles. Mite antigen is easily measured in the air during house cleaning activities. Dust mites do not bite and do not cause harm to humans, other than by triggering allergies/asthma.  Ways to decrease your exposure to dust mites in your home:  1. Encase mattresses, box springs and pillows with a mite-impermeable barrier or cover  2. Wash sheets, blankets and drapes weekly in hot water (130 F) with detergent and dry them in a dryer on the hot setting.  3. Have the room cleaned frequently with a vacuum cleaner and a damp dust-mop. For carpeting or rugs, vacuuming with a vacuum cleaner equipped with a high-efficiency particulate air (HEPA) filter. The dust mite allergic individual should not be in a room  which is being cleaned and should wait 1 hour after cleaning before going into the room.  4. Do not sleep on upholstered furniture (eg, couches).  5. If possible removing carpeting, upholstered furniture and drapery from the home is ideal. Horizontal blinds should be eliminated in the rooms where the person spends the  most time (bedroom, study, television room). Washable vinyl, roller-type shades are optimal.  6. Remove all non-washable stuffed toys from the bedroom. Wash stuffed toys weekly like sheets and blankets above.  7. Reduce indoor humidity to less than 50%. Inexpensive humidity monitors can be purchased at most hardware stores. Do not use a humidifier as can make the problem worse and are not recommended.  Control of Mold Allergen Mold and fungi can grow on a variety of surfaces provided certain temperature and moisture conditions exist.  Outdoor molds grow on plants, decaying vegetation and soil.  The major outdoor mold, Alternaria and Cladosporium, are found in very high numbers during hot and dry conditions.  Generally, a late Summer - Fall peak is seen for common outdoor fungal spores.  Rain will temporarily lower outdoor mold spore count, but counts rise rapidly when the rainy period ends.  The most important indoor molds are Aspergillus and Penicillium.  Dark, humid and poorly ventilated basements are ideal sites for mold growth.  The next most common sites of mold growth are the bathroom and the kitchen.  Outdoor [employer] Use air conditioning and keep windows closed Avoid exposure to decaying vegetation. Avoid leaf raking. Avoid grain handling. Consider wearing a face mask if working in moldy areas.  Indoor Mold Control Maintain humidity below 50%. Clean washable surfaces with 5% bleach solution. Remove sources e.g. Contaminated carpets.

## 2021-12-27 NOTE — Progress Notes (Unsigned)
   [place]  [place] [address] Dept: [phone]  FOLLOW UP NOTE  Patient ID: Gabrielle Terry, female    DOB: 1960-11-02  Age: 61 y.o. MRN: [id number] Date of Office Visit: 12/28/2021  Assessment  Chief Complaint: No chief complaint on file.  HPI Gabrielle Terry is a 61 year old female who presents to the clinic for follow-up visit.  She was last seen in this clinic on 12/03/2020 by Gareth Morgan, FNP, for evaluation of asthma, allergic rhinitis, and allergy to insect sting.  Her last environmental allergy skin testing was 05/01/2017 and was positive to grass pollen, weed pollen, ragweed pollen, tree pollen, and mold mix 1 and mold mix 2.  Her last venom testing was on 01/08/2018 and was positive to white hornet, yellow jacket, and yellow hornet.   Drug Allergies:  No Known Allergies  Physical Exam: There were no vitals taken for this visit.   Physical Exam  Diagnostics:    Assessment and Plan: No diagnosis found.  No orders of the defined types were placed in this encounter.   There are no Patient Instructions on file for this visit.  No follow-ups on file.    Thank you for the opportunity to care for this patient.  Please do not hesitate to contact me with questions.  Gareth Morgan, FNP [facility]

## 2021-12-28 ENCOUNTER — Ambulatory Visit (INDEPENDENT_AMBULATORY_CARE_PROVIDER_SITE_OTHER): Payer: BC Managed Care – PPO | Admitting: Family Medicine

## 2021-12-28 ENCOUNTER — Encounter: Payer: Self-pay | Admitting: Family Medicine

## 2021-12-28 VITALS — BP 136/84 | HR 81 | Temp 96.8°F | Resp 16 | Ht 65.0 in | Wt 171.8 lb

## 2021-12-28 DIAGNOSIS — J3089 Other allergic rhinitis: Secondary | ICD-10-CM

## 2021-12-28 DIAGNOSIS — J4550 Severe persistent asthma, uncomplicated: Secondary | ICD-10-CM | POA: Diagnosis not present

## 2021-12-28 DIAGNOSIS — T63481D Toxic effect of venom of other arthropod, accidental (unintentional), subsequent encounter: Secondary | ICD-10-CM

## 2021-12-28 DIAGNOSIS — J302 Other seasonal allergic rhinitis: Secondary | ICD-10-CM

## 2021-12-28 MED ORDER — EPINEPHRINE 0.3 MG/0.3ML IJ SOAJ: INTRAMUSCULAR | 3 refills | Status: DC

## 2022-01-05 DIAGNOSIS — R7301 Impaired fasting glucose: Secondary | ICD-10-CM | POA: Diagnosis not present

## 2022-01-05 DIAGNOSIS — E7849 Other hyperlipidemia: Secondary | ICD-10-CM | POA: Diagnosis not present

## 2022-01-05 DIAGNOSIS — I10 Essential (primary) hypertension: Secondary | ICD-10-CM | POA: Diagnosis not present

## 2022-01-05 DIAGNOSIS — K5900 Constipation, unspecified: Secondary | ICD-10-CM | POA: Diagnosis not present

## 2022-01-10 ENCOUNTER — Ambulatory Visit (INDEPENDENT_AMBULATORY_CARE_PROVIDER_SITE_OTHER): Payer: BC Managed Care – PPO | Admitting: Adult Health

## 2022-02-23 DIAGNOSIS — I10 Essential (primary) hypertension: Secondary | ICD-10-CM | POA: Diagnosis not present

## 2022-02-23 DIAGNOSIS — E7800 Pure hypercholesterolemia, unspecified: Secondary | ICD-10-CM | POA: Diagnosis not present

## 2022-02-23 DIAGNOSIS — K5900 Constipation, unspecified: Secondary | ICD-10-CM | POA: Diagnosis not present

## 2022-02-23 DIAGNOSIS — Z9189 Other specified personal risk factors, not elsewhere classified: Secondary | ICD-10-CM | POA: Diagnosis not present

## 2022-02-23 DIAGNOSIS — R7303 Prediabetes: Secondary | ICD-10-CM | POA: Diagnosis not present

## 2022-03-01 ENCOUNTER — Encounter (INDEPENDENT_AMBULATORY_CARE_PROVIDER_SITE_OTHER): Payer: Self-pay

## 2022-03-01 DIAGNOSIS — M546 Pain in thoracic spine: Secondary | ICD-10-CM | POA: Diagnosis not present

## 2022-03-01 DIAGNOSIS — M9903 Segmental and somatic dysfunction of lumbar region: Secondary | ICD-10-CM | POA: Diagnosis not present

## 2022-03-01 DIAGNOSIS — M62830 Muscle spasm of back: Secondary | ICD-10-CM | POA: Diagnosis not present

## 2022-03-01 DIAGNOSIS — M9902 Segmental and somatic dysfunction of thoracic region: Secondary | ICD-10-CM | POA: Diagnosis not present

## 2022-03-02 DIAGNOSIS — D225 Melanocytic nevi of trunk: Secondary | ICD-10-CM | POA: Diagnosis not present

## 2022-03-02 DIAGNOSIS — L700 Acne vulgaris: Secondary | ICD-10-CM | POA: Diagnosis not present

## 2022-03-09 DIAGNOSIS — M62830 Muscle spasm of back: Secondary | ICD-10-CM | POA: Diagnosis not present

## 2022-03-09 DIAGNOSIS — M9902 Segmental and somatic dysfunction of thoracic region: Secondary | ICD-10-CM | POA: Diagnosis not present

## 2022-03-09 DIAGNOSIS — M546 Pain in thoracic spine: Secondary | ICD-10-CM | POA: Diagnosis not present

## 2022-03-09 DIAGNOSIS — M9903 Segmental and somatic dysfunction of lumbar region: Secondary | ICD-10-CM | POA: Diagnosis not present

## 2022-03-16 NOTE — Progress Notes (Signed)
I,Gabrielle Terry,acting as a Education administrator for [employer], PA-C.,have documented all relevant documentation on the behalf of Gabrielle Speak, PA-C,as directed by  [employer], PA-C while in the presence of [employer], PA-C.   Established patient visit   Patient: Gabrielle Terry   DOB: Aug 04, 1960   61 y.o. Female  MRN: [id number] Visit Date: 03/17/2022  Today's healthcare provider: [name], PA-C   Chief Complaint  Patient presents with   Back Pain   Subjective    Back Pain  She reports new onset back pain. There was not an injury that may have caused the pain. The most recent episode started  about 1 wk ago and reports this episode is different and is painful with deep breath.  The pain is located  across the lower back without radiation described as sharp, is 8/10 in intensity, occurring intermittently.  Aggravating factors: moving   Relieving factors: none. She has tried application of heating pad with temporally relief.  Sees chiropractor intermittently. Reports this episode feels different.   Has mammogram scheduled and pap was done this year/normal per pt.     -----------------------------------------------------------------------------------------  Medications: Outpatient Medications Prior to Visit  Medication Sig   ADVAIR HFA 230-21 MCG/ACT inhaler INHALE 2 PUFFS INTO THE LUNGS TWICE DAILY   albuterol (VENTOLIN HFA) 108 (90 Base) MCG/ACT inhaler Inhale 2 puffs into the lungs every 6 (six) hours as needed for wheezing or shortness of breath.   EPINEPHrine 0.3 mg/0.3 mL IJ SOAJ injection Use as directed for life-threatening allergic reaction.   FASENRA PEN 30 MG/ML SOAJ Inject 30 mg into the skin every 8 (eight) weeks.   linaclotide (LINZESS) 290 MCG CAPS capsule Take 290 mcg by mouth daily.   losartan-hydrochlorothiazide (HYZAAR) 100-25 MG tablet Take 1 tablet by mouth daily. **Appointment needed for further refills.** (Patient taking differently: Patient takes 1/2  tab)   polyethylene glycol (MIRALAX / GLYCOLAX) packet Take 17 g by mouth daily.   rosuvastatin (CRESTOR) 20 MG tablet Take 1 tablet (20 mg total) by mouth at bedtime.   tirzepatide (MOUNJARO) 10 MG/0.5ML Pen Inject 10 mg into the skin once a week.   Facility-Administered Medications Prior to Visit  Medication Dose Route Frequency Provider   Benralizumab SOSY 30 mg  30 mg Subcutaneous Q8 Weeks Valentina Shaggy, MD    Review of Systems  All other systems reviewed and are negative. See hpi     Objective    BP 111/84 (BP Location: Right Arm, Patient Position: Sitting, Cuff Size: Normal)   Pulse 75   Temp 97.6 F (36.4 C) (Oral)   Resp 16   Wt 164 lb (74.4 kg)   SpO2 100%   BMI 27.29 kg/m    Physical Exam Vitals reviewed.  Constitutional:      General: She is not in acute distress.    Appearance: Normal appearance. She is well-developed. She is not diaphoretic.  HENT:     Head: Normocephalic and atraumatic.  Eyes:     General: No scleral icterus.    Conjunctiva/sclera: Conjunctivae normal.  Neck:     Thyroid: No thyromegaly.  Cardiovascular:     Rate and Rhythm: Normal rate and regular rhythm.     Pulses: Normal pulses.     Heart sounds: Normal heart sounds. No murmur heard. Pulmonary:     Effort: Pulmonary effort is normal. No respiratory distress.     Breath sounds: Normal breath sounds. No wheezing, rhonchi or rales.  Musculoskeletal:  General: Tenderness (low back midline) present. No swelling, deformity or signs of injury.     Cervical back: Neck supple.     Right lower leg: No edema.     Left lower leg: No edema.  Lymphadenopathy:     Cervical: No cervical adenopathy.  Skin:    General: Skin is warm and dry.     Findings: No rash.  Neurological:     Mental Status: She is alert and oriented to person, place, and time. Mental status is at baseline.  Psychiatric:        Mood and Affect: Mood normal.        Behavior: Behavior normal.      No  results found for any visits on 03/17/22.  Assessment & Plan     1. Low back pain Normal vitals Recommended topical antiinflammatory, OTC pain medications but advise to exercise caution with taking ibuprofen/NSAIDs, heat / wet heat / ice - cyclobenzaprine (FLEXERIL) 5 MG tablet; Take 1 tablet (5 mg total) by mouth 3 (three) times daily as needed for muscle spasms.  Dispense: 30 tablet; Refill: 0  Will refer to PT as long as pt will message her preferred PT clinic  FU PRN    The patient was advised to call back or seek an in-person evaluation if the symptoms worsen or if the condition fails to improve as anticipated.  I discussed the assessment and treatment plan with the patient. The patient was provided an opportunity to ask questions and all were answered. The patient agreed with the plan and demonstrated an understanding of the instructions.  The entirety of the information documented in the History of Present Illness, Review of Systems and Physical Exam were personally obtained by me. Portions of this information were initially documented by the CMA and reviewed by me for thoroughness and accuracy.  Portions of this note were created using dictation software and may contain typographical errors.   Gabrielle Speak, PA-C  [facility] [phone] (phone) [phone] (fax)  [place]

## 2022-03-17 ENCOUNTER — Encounter: Payer: Self-pay | Admitting: Physician Assistant

## 2022-03-17 ENCOUNTER — Ambulatory Visit (INDEPENDENT_AMBULATORY_CARE_PROVIDER_SITE_OTHER): Payer: BC Managed Care – PPO | Admitting: Physician Assistant

## 2022-03-17 VITALS — BP 111/84 | HR 75 | Temp 97.6°F | Resp 16 | Wt 164.0 lb

## 2022-03-17 DIAGNOSIS — M5440 Lumbago with sciatica, unspecified side: Secondary | ICD-10-CM

## 2022-03-17 DIAGNOSIS — M545 Low back pain, unspecified: Secondary | ICD-10-CM | POA: Diagnosis not present

## 2022-03-17 MED ORDER — CYCLOBENZAPRINE HCL 5 MG PO TABS: 5.0000 mg | ORAL_TABLET | Freq: Three times a day (TID) | ORAL | 0 refills | Status: DC | PRN

## 2022-03-24 ENCOUNTER — Other Ambulatory Visit: Payer: Self-pay | Admitting: Family Medicine

## 2022-03-24 DIAGNOSIS — I10 Essential (primary) hypertension: Secondary | ICD-10-CM

## 2022-03-28 NOTE — Telephone Encounter (Signed)
Requested medication (s) are due for refill today: Yes  Requested medication (s) are on the active medication list: Yes  Last refill:  09/22/21  Future visit scheduled: No  Notes to clinic:  Protocol indicates lab work is needed.    Requested Prescriptions  Pending Prescriptions Disp Refills   losartan-hydrochlorothiazide (HYZAAR) 100-25 MG tablet [Pharmacy Med Name: LOSARTAN/HCTZ 100/25MG TABLETS] 90 tablet 0    Sig: TAKE 1 TABLET BY MOUTH DAILY     Cardiovascular: ARB + Diuretic Combos Failed - 03/24/2022 11:51 AM      Failed - K in normal range and within 180 days    Potassium  Date Value Ref Range Status  03/24/2021 3.7 3.4 - 5.3 Final         Failed - Na in normal range and within 180 days    Sodium  Date Value Ref Range Status  03/24/2021 144 137 - 147 Final         Failed - Cr in normal range and within 180 days    Creatinine  Date Value Ref Range Status  03/24/2021 0.8 0.5 - 1.1 Final   Creatinine, Ser  Date Value Ref Range Status  10/16/2019 0.80 0.57 - 1.00 mg/dL Final         Failed - eGFR is 10 or above and within 180 days    GFR calc Af Amer  Date Value Ref Range Status  10/16/2019 94 >59 mL/min/1.73 Final   GFR calc non Af Amer  Date Value Ref Range Status  10/16/2019 82 >59 mL/min/1.73 Final         Passed - Patient is not pregnant      Passed - Last BP in normal range    BP Readings from Last 1 Encounters:  03/17/22 111/84         Passed - Valid encounter within last 6 months    Recent Outpatient Visits           1 week ago Acute midline low back pain without sciatica   [facility] [place], [place], PA-C   4 months ago Annual physical exam   [facility] Jerrol Banana., MD   1 year ago Essential hypertension   [facility] Jerrol Banana., MD   2 years ago Pneumonia due to COVID-19 virus   [facility] Jerrol Banana., MD   2 years ago Pneumonia due to  COVID-19 virus   [facility] Jerrol Banana., MD       Future Appointments             In 7 months Jerrol Banana., MD [facility] [place]

## 2022-03-29 ENCOUNTER — Encounter: Payer: Self-pay | Admitting: Physician Assistant

## 2022-03-29 DIAGNOSIS — G8929 Other chronic pain: Secondary | ICD-10-CM | POA: Diagnosis not present

## 2022-03-29 DIAGNOSIS — K5903 Drug induced constipation: Secondary | ICD-10-CM | POA: Diagnosis not present

## 2022-03-29 DIAGNOSIS — M545 Low back pain, unspecified: Secondary | ICD-10-CM

## 2022-03-29 DIAGNOSIS — R7303 Prediabetes: Secondary | ICD-10-CM | POA: Diagnosis not present

## 2022-03-30 DIAGNOSIS — Z1231 Encounter for screening mammogram for malignant neoplasm of breast: Secondary | ICD-10-CM | POA: Diagnosis not present

## 2022-03-30 LAB — HM MAMMOGRAPHY

## 2022-04-06 LAB — HM MAMMOGRAPHY

## 2022-04-19 ENCOUNTER — Encounter: Payer: Self-pay | Admitting: *Deleted

## 2022-04-24 ENCOUNTER — Ambulatory Visit (HOSPITAL_BASED_OUTPATIENT_CLINIC_OR_DEPARTMENT_OTHER): Payer: BC Managed Care – PPO | Attending: Physician Assistant | Admitting: Physical Therapy

## 2022-04-24 ENCOUNTER — Other Ambulatory Visit: Payer: Self-pay

## 2022-04-24 ENCOUNTER — Encounter (HOSPITAL_BASED_OUTPATIENT_CLINIC_OR_DEPARTMENT_OTHER): Payer: Self-pay | Admitting: Physical Therapy

## 2022-04-24 DIAGNOSIS — R262 Difficulty in walking, not elsewhere classified: Secondary | ICD-10-CM | POA: Insufficient documentation

## 2022-04-24 DIAGNOSIS — M25551 Pain in right hip: Secondary | ICD-10-CM | POA: Insufficient documentation

## 2022-04-24 DIAGNOSIS — G8929 Other chronic pain: Secondary | ICD-10-CM | POA: Insufficient documentation

## 2022-04-24 DIAGNOSIS — M5459 Other low back pain: Secondary | ICD-10-CM | POA: Diagnosis not present

## 2022-04-24 DIAGNOSIS — M25561 Pain in right knee: Secondary | ICD-10-CM | POA: Diagnosis not present

## 2022-04-24 DIAGNOSIS — M545 Low back pain, unspecified: Secondary | ICD-10-CM | POA: Diagnosis not present

## 2022-04-24 NOTE — Therapy (Signed)
OUTPATIENT PHYSICAL THERAPY THORACOLUMBAR EVALUATION   Patient Name: Gabrielle Terry MRN: [id number] DOB:06/18/1961, 61 y.o., female Today's Date: 04/24/2022   PT End of Session - 04/24/22 1648     Visit Number 1    Number of Visits 17    Date for PT Re-Evaluation 06/23/22    Authorization Type BCBS VL 60    PT Start Time 1645    PT Stop Time 1724    PT Time Calculation (min) 39 min    Activity Tolerance Patient tolerated treatment well    Behavior During Therapy [facility] for tasks assessed/performed             Past Medical History:  Diagnosis Date   Arthritis    knees   Asthma 2009   Breast hematoma 2011   Right   Colon polyp 2013   3   Constipation    Cough    from sinus drainage   COVID-19 virus infection 07/2019   Diffuse cystic mastopathy    Foot cramps    GERD (gastroesophageal reflux disease)    High cholesterol    Hypercholesteremia    Hypertension    Ulcer    Past Surgical History:  Procedure Laterality Date   ABDOMINAL HYSTERECTOMY  1997   BREAST SURGERY Right 2011   hematoma   [place]  2013   3 polyps/ Dr Jamal Collin   COLONOSCOPY WITH PROPOFOL N/A 03/21/2017   Procedure: COLONOSCOPY WITH PROPOFOL;  Surgeon: Christene Lye, MD;  Location: [facility] ENDOSCOPY;  Service: Endoscopy;  Laterality: N/A;   ESOPHAGOGASTRODUODENOSCOPY (EGD) WITH PROPOFOL N/A 01/26/2016   Procedure: ESOPHAGOGASTRODUODENOSCOPY (EGD) WITH PROPOFOL;  Surgeon: Manya Silvas, MD;  Location: [facility] ENDOSCOPY;  Service: Endoscopy;  Laterality: N/A;   ETHMOIDECTOMY Bilateral 04/29/2015   Procedure: TOTAL ETHMOIDECTOMY;  Surgeon: Margaretha Sheffield, MD;  Location: [place];  Service: ENT;  Laterality: Bilateral;   FRONTAL SINUS EXPLORATION Bilateral 04/29/2015   Procedure: FRONTAL SINUS EXPLORATION;  Surgeon: Margaretha Sheffield, MD;  Location: [place];  Service: ENT;  Laterality: Bilateral;   IMAGE GUIDED SINUS SURGERY N/A 04/29/2015   Procedure:  IMAGE GUIDED SINUS SURGERY;  Surgeon: Margaretha Sheffield, MD;  Location: [place];  Service: ENT;  Laterality: N/A;  GAVE DISK TO CE CE   MAXILLARY ANTROSTOMY Bilateral 04/29/2015   Procedure: MAXILLARY ANTROSTOMY WITH REMOVAL OF CONTENTS;  Surgeon: Margaretha Sheffield, MD;  Location: [place];  Service: ENT;  Laterality: Bilateral;   [place]  2013   salpingo oophorectmy  1997   SPHENOIDECTOMY Bilateral 04/29/2015   Procedure: SPHENOIDECTOMY;  Surgeon: Margaretha Sheffield, MD;  Location: [place];  Service: ENT;  Laterality: Bilateral;   TUBAL LIGATION     UTERINE FIBROID SURGERY     removed   Patient Active Problem List   Diagnosis Date Noted   Severe persistent asthma without complication 68/34/1962   Seasonal and perennial allergic rhinitis 12/28/2021   Impaired fasting glucose 11/21/2021   Chronic pain of right knee 02/23/2021   Class 1 obesity with serious comorbidity and body mass index (BMI) of 34.0 to 34.9 in adult 02/23/2021   Polyphagia 11/03/2020   Class 2 severe obesity due to excess calories with serious comorbidity and body mass index (BMI) of 37.0 to 37.9 in adult ([place]) 09/23/2019   Pneumonia due to COVID-19 virus 08/16/2019   Acute hypoxemic respiratory failure due to severe acute respiratory syndrome coronavirus 2 (SARS-CoV-2)  disease ([place]) 08/16/2019   Essential hypertension    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   AB (asthmatic bronchitis) 10/14/2015   Abnormal bruising 10/14/2015   Toxic effect of venom 10/14/2015   Airway hyperreactivity 10/14/2015   Acid reflux 10/14/2015   Elective abortion 10/14/2015   Hypercholesteremia 10/14/2015   Cannot sleep 10/14/2015   Hemorrhoids, internal 10/14/2015   Climacteric 10/14/2015   Localized superficial swelling, mass, or lump 10/14/2015   Obesity (BMI 30.0-34.9) 10/14/2015   Plantar fasciitis 10/14/2015   Allergic rhinitis 04/07/2015   Arthritis of knee,  degenerative 12/18/2014   Diffuse cystic mastopathy 01/28/2014   Personal history of colonic polyps 01/09/2013   Mild intermittent asthma 2009     REFERRING PROVIDER: [name], PA-C  REFERRING DIAG: M54.50 (ICD-10-CM) - Midline low back pain without sciatica, unspecified chronicity  Rationale for Evaluation and Treatment Rehabilitation  THERAPY DIAG:  Other low back pain  Pain in right hip  Chronic pain of right knee  Difficulty in walking, not elsewhere classified  ONSET DATE:  30 yr ago  SUBJECTIVE:                                                                                                                                                                                           SUBJECTIVE STATEMENT: Initially started when lifting my infant form sleep 30 yr ago. I have to lay on my sides, cannot lay supine or prone. Floor exercises hurt tailbone and sometimes my Rt shoulder blade. Did 2 visits to chirpractor, no adjustments, just attached to TENS. Rt sciatic pain every now and then.   PERTINENT HISTORY:  Gynecological surgeries  PAIN:  Are you having pain? Yes: NPRS scale: 2/10 Pain location: across low back, rt hip and knee Pain description: quick, stabbing Aggravating factors: laying supine Relieving factors: sidelying   PRECAUTIONS: None  WEIGHT BEARING RESTRICTIONS No  FALLS:  Has patient fallen in last 6 months? No  OCCUPATION: working from home so I sit a lot  PLOF: Independent  PATIENT GOALS floor exercises to workout, running a little, sleep on back.   OBJECTIVE:   DIAGNOSTIC FINDINGS:  Rt knee xray 01/03/20: IMPRESSION: Osteoarthritic change, most pronounced in the patellofemoral joint. No evident fracture, dislocation, or joint effusion.  No available imaging for spine/pelvis  PATIENT SURVEYS:  FOTO 47   MUSCLE LENGTH: Overall limited flexibility  POSTURE:  seated with Rt leg crossed over left during subjective  questioning Rounded shoulders Rt arch collapse  PALPATION: TTP Rt SIJ  LUMBAR ROM:   Active  A/PROM  eval  Flexion   Extension   Right lateral flexion  Left lateral flexion P!  Right rotation   Left rotation    (Blank rows = not tested)  LOWER EXTREMITY ROM:     Passive  Right eval Left eval  Hip flexion    Hip extension    Hip abduction    Hip adduction    Hip internal rotation    Hip external rotation    Knee flexion    Knee extension -5 0  Ankle dorsiflexion    Ankle plantarflexion    Ankle inversion    Ankle eversion     (Blank rows = not tested)  LOWER EXTREMITY MMT:    MMT Right eval Left eval  Hip flexion    Hip extension    Hip abduction    Hip adduction    Hip internal rotation    Hip external rotation    Knee flexion    Knee extension    Ankle dorsiflexion    Ankle plantarflexion    Ankle inversion    Ankle eversion     (Blank rows = not tested)   GAIT: Lt LE vaulting    TODAY'S TREATMENT  Treatment                            04/24/22 (EVAL):  3-layer heel lift added to Rt heel - Seated Hamstring Stretch  - 1 x daily - 7 x weekly - 5 breaths hold - Seated Piriformis Stretch with Trunk Bend  - Seated Hip Flexor Stretch  - Seated Hip Adductor Stretch  - Standing Gastroc Stretch  - Standing 'L' Stretch at [employer]   PATIENT EDUCATION:  Education details: Anatomy of condition, POC, HEP, exercise form/rationale Person educated: Patient Education method: Explanation, Demonstration, Tactile cues, Verbal cues, and Handouts Education comprehension: verbalized understanding, returned demonstration, verbal cues required, tactile cues required, and needs further education   HOME EXERCISE PROGRAM: [id number] Stretches: choose 2 to do at breakfast, lunch, dinner  ASSESSMENT:  CLINICAL IMPRESSION: Patient is a 61 y.o. F who was seen today for physical therapy evaluation and treatment for chronic LBP involving her right hip and knee.  Notable LLD without pelvic rotation but Rt knee is lacking full extension. Did not have closed shoes so we were unable to trial walking in heel lift but she did report that it felt better to stand on it. Will trial this in shoes until her next appt and we will determine need to continue using it-was instructed to remove it should she feel an increase in concordant pain.    OBJECTIVE IMPAIRMENTS Abnormal gait, decreased activity tolerance, difficulty walking, decreased ROM, decreased strength, impaired perceived functional ability, increased muscle spasms, impaired flexibility, improper body mechanics, postural dysfunction, and pain.   ACTIVITY LIMITATIONS carrying, lifting, bending, sitting, standing, squatting, sleeping, stairs, bed mobility, and locomotion level  PARTICIPATION LIMITATIONS: meal prep, cleaning, laundry, driving, shopping, community activity, occupation, and fitness  PERSONAL FACTORS Time since onset of injury/illness/exacerbation are also affecting patient's functional outcome.   REHAB POTENTIAL: Good  CLINICAL DECISION MAKING: Stable/uncomplicated  EVALUATION COMPLEXITY: Low   GOALS: Goals reviewed with patient? Yes  SHORT TERM GOALS: Target date: 05/15/2022  Will determine need for heel lift Baseline: Goal status: INITIAL  2.  Pt will verbalize compliance with stretching regimen Baseline:  Goal status: INITIAL   LONG TERM GOALS: Target date: 06/23/22  Pt will demo full lumbar AROM without pain Baseline: pain in Rt SB, significant discomfort noted in flexion-pulling into scapular  region Goal status: INITIAL  2.  Pt will tolerate a supine position without sacral pain Baseline:  Goal status: INITIAL  3.  FOTO to meet goal Baseline:  Goal status: INITIAL  4.  Pt will tolerate short distances of straight line jogging, on level surface, without limitation by LBP Baseline:  Goal status: INITIAL  5.  Will demo proper bending/lifting mechanics Baseline:   Goal status: INITIAL    PLAN: PT FREQUENCY: 2x/week  PT DURATION: 8 weeks  PLANNED INTERVENTIONS: Therapeutic exercises, Therapeutic activity, Neuromuscular re-education, Balance training, Gait training, Patient/Family education, Self Care, Joint mobilization, Stair training, Aquatic Therapy, Dry Needling, Spinal mobilization, Cryotherapy, Moist heat, Taping, Ionotophoresis '4mg'$ /ml Dexamethasone, Manual therapy, and Re-evaluation.  PLAN FOR NEXT SESSION: discuss heel lift and adjust PRN, test hip abd strength with dynamometer  Trapper Meech C. Lorali Khamis PT, DPT 04/24/22 7:37 PM

## 2022-04-25 NOTE — Therapy (Unsigned)
OUTPATIENT PHYSICAL THERAPY TREATMENT NOTE   Patient Name: Gabrielle Terry MRN: [id number] DOB:[birth year], 61 y.o., female Today's Date: 04/27/2022   REFERRING PROVIDER: [name], PA-C  END OF SESSION:   PT End of Session - 04/27/22 1638     Visit Number 2    Number of Visits 17    Date for PT Re-Evaluation 06/23/22    Authorization Type BCBS VL 60    PT Start Time 1600    PT Stop Time 1638    PT Time Calculation (min) 38 min    Activity Tolerance Patient tolerated treatment well    Behavior During Therapy [facility] for tasks assessed/performed             Past Medical History:  Diagnosis Date   Arthritis    knees   Asthma 2009   Breast hematoma 2011   Right   Colon polyp 2013   3   Constipation    Cough    from sinus drainage   COVID-19 virus infection 07/2019   Diffuse cystic mastopathy    Foot cramps    GERD (gastroesophageal reflux disease)    High cholesterol    Hypercholesteremia    Hypertension    Ulcer    Past Surgical History:  Procedure Laterality Date   ABDOMINAL HYSTERECTOMY  1997   BREAST SURGERY Right 2011   hematoma   [place]  2013   3 polyps/ Dr Jamal Collin   COLONOSCOPY WITH PROPOFOL N/A 03/21/2017   Procedure: COLONOSCOPY WITH PROPOFOL;  Surgeon: Christene Lye, MD;  Location: [facility] ENDOSCOPY;  Service: Endoscopy;  Laterality: N/A;   ESOPHAGOGASTRODUODENOSCOPY (EGD) WITH PROPOFOL N/A 01/26/2016   Procedure: ESOPHAGOGASTRODUODENOSCOPY (EGD) WITH PROPOFOL;  Surgeon: Manya Silvas, MD;  Location: [facility] ENDOSCOPY;  Service: Endoscopy;  Laterality: N/A;   ETHMOIDECTOMY Bilateral 04/29/2015   Procedure: TOTAL ETHMOIDECTOMY;  Surgeon: Margaretha Sheffield, MD;  Location: [place];  Service: ENT;  Laterality: Bilateral;   FRONTAL SINUS EXPLORATION Bilateral 04/29/2015   Procedure: FRONTAL SINUS EXPLORATION;  Surgeon: Margaretha Sheffield, MD;  Location: [place];  Service: ENT;  Laterality: Bilateral;    IMAGE GUIDED SINUS SURGERY N/A 04/29/2015   Procedure: IMAGE GUIDED SINUS SURGERY;  Surgeon: Margaretha Sheffield, MD;  Location: [place];  Service: ENT;  Laterality: N/A;  GAVE DISK TO CE CE   MAXILLARY ANTROSTOMY Bilateral 04/29/2015   Procedure: MAXILLARY ANTROSTOMY WITH REMOVAL OF CONTENTS;  Surgeon: Margaretha Sheffield, MD;  Location: [place];  Service: ENT;  Laterality: Bilateral;   [place]  2013   salpingo oophorectmy  1997   SPHENOIDECTOMY Bilateral 04/29/2015   Procedure: SPHENOIDECTOMY;  Surgeon: Margaretha Sheffield, MD;  Location: [place];  Service: ENT;  Laterality: Bilateral;   TUBAL LIGATION     UTERINE FIBROID SURGERY     removed   Patient Active Problem List   Diagnosis Date Noted   Severe persistent asthma without complication 65/46/5035   Seasonal and perennial allergic rhinitis 12/28/2021   Impaired fasting glucose 11/21/2021   Chronic pain of right knee 02/23/2021   Class 1 obesity with serious comorbidity and body mass index (BMI) of 34.0 to 34.9 in adult 02/23/2021   Polyphagia 11/03/2020   Class 2 severe obesity due to excess calories with serious comorbidity and body mass index (BMI) of 37.0 to 37.9 in adult ([place]) 09/23/2019   Pneumonia due to COVID-19 virus 08/16/2019   Acute hypoxemic  respiratory failure due to severe acute respiratory syndrome coronavirus 2 (SARS-CoV-2) disease ([place]) 08/16/2019   Essential hypertension    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   AB (asthmatic bronchitis) 10/14/2015   Abnormal bruising 10/14/2015   Toxic effect of venom 10/14/2015   Airway hyperreactivity 10/14/2015   Acid reflux 10/14/2015   Elective abortion 10/14/2015   Hypercholesteremia 10/14/2015   Cannot sleep 10/14/2015   Hemorrhoids, internal 10/14/2015   Climacteric 10/14/2015   Localized superficial swelling, mass, or lump 10/14/2015   Obesity (BMI 30.0-34.9) 10/14/2015   Plantar fasciitis 10/14/2015    Allergic rhinitis 04/07/2015   Arthritis of knee, degenerative 12/18/2014   Diffuse cystic mastopathy 01/28/2014   Personal history of colonic polyps 01/09/2013   Mild intermittent asthma 2009    REFERRING DIAG: M54.50 (ICD-10-CM) - Midline low back pain without sciatica, unspecified chronicity  THERAPY DIAG:  Other low back pain  Pain in right hip  Chronic pain of right knee  Difficulty in walking, not elsewhere classified  Rationale for Evaluation and Treatment Rehabilitation  PERTINENT HISTORY: Gynecological surgeries  PRECAUTIONS: None  SUBJECTIVE: Pt reports continued lower back pain at night. She states that she has issues doing floor exercises.   Initially started when lifting my infant form sleep 30 yr ago. I have to lay on my sides, cannot lay supine or prone. Floor exercises hurt tailbone and sometimes my Rt shoulder blade. Did 2 visits to chirpractor, no adjustments, just attached to TENS. Rt sciatic pain every now and then.   PAIN:  Are you having pain? Yes: NPRS scale: 2/10 Pain location: across low back, rt hip and knee Pain description: quick, stabbing Aggravating factors: laying supine Relieving factors: sidelying    OBJECTIVE:    DIAGNOSTIC FINDINGS:  Rt knee xray 01/03/20: IMPRESSION: Osteoarthritic change, most pronounced in the patellofemoral joint. No evident fracture, dislocation, or joint effusion.   No available imaging for spine/pelvis   PATIENT SURVEYS:  FOTO 47     MUSCLE LENGTH: Overall limited flexibility   POSTURE:  seated with Rt leg crossed over left during subjective questioning Rounded shoulders Rt arch collapse   PALPATION: TTP Rt SIJ   LUMBAR ROM:    Active  A/PROM  eval  Flexion    Extension    Right lateral flexion    Left lateral flexion P!  Right rotation    Left rotation     (Blank rows = not tested)   LOWER EXTREMITY ROM:      Passive  Right eval Left eval  Hip flexion      Hip extension      Hip  abduction      Hip adduction      Hip internal rotation      Hip external rotation      Knee flexion      Knee extension -5 0  Ankle dorsiflexion      Ankle plantarflexion      Ankle inversion      Ankle eversion       (Blank rows = not tested)   LOWER EXTREMITY MMT:     MMT Right eval Left eval  Hip flexion      Hip extension      Hip abduction      Hip adduction      Hip internal rotation      Hip external rotation      Knee flexion      Knee extension      Ankle  dorsiflexion      Ankle plantarflexion      Ankle inversion      Ankle eversion       (Blank rows = not tested)     GAIT: Lt LE vaulting       TODAY'S TREATMENT  Treatment                            04/27/22 :  - Nustep 5 min level 5.  - Seated Hamstring Stretch  - 1 x daily - 7 x weekly - 5 breaths hold - Seated Piriformis Stretch with Trunk Bend  - Seated Hip Flexor Stretch  - Seated Hip Adductor Stretch  - Standing Gastroc Stretch  - Standing 'L' Stretch at Counter  - PPTx15  - LTR x10 Bilat  - Bridges x20 - Sidelying clams with green theraband 2x 10 bilat  - quadruped hip abduction 2x10 bilat  - Cat/ cow 10x 5 sec hold - Squats from hi- low table 2x5     Treatment                            04/24/22 (EVAL):   3-layer heel lift added to Rt heel - Seated Hamstring Stretch  - 1 x daily - 7 x weekly - 5 breaths hold - Seated Piriformis Stretch with Trunk Bend  - Seated Hip Flexor Stretch  - Seated Hip Adductor Stretch  - Standing Gastroc Stretch  - Standing 'L' Stretch at [employer]    PATIENT EDUCATION:  Education details: Anatomy of condition, POC, HEP, exercise form/rationale Person educated: Patient Education method: Explanation, Demonstration, Tactile cues, Verbal cues, and Handouts Education comprehension: verbalized understanding, returned demonstration, verbal cues required, tactile cues required, and needs further education     HOME EXERCISE PROGRAM: [id number] Stretches: choose  2 to do at breakfast, lunch, dinner   ASSESSMENT:   CLINICAL IMPRESSION: Patient responded well to progressions today. She reports increased fatigue but no increase in pain. Session with focus on mobility and strengthening of bilat LE strengthening. Reviewed HEP with good forum noted. Pt was provided alternative ways of performing exercises given at her work out class per request. Pt will continue to benefit from skilled PT to address continued deficits.     OBJECTIVE IMPAIRMENTS Abnormal gait, decreased activity tolerance, difficulty walking, decreased ROM, decreased strength, impaired perceived functional ability, increased muscle spasms, impaired flexibility, improper body mechanics, postural dysfunction, and pain.    ACTIVITY LIMITATIONS carrying, lifting, bending, sitting, standing, squatting, sleeping, stairs, bed mobility, and locomotion level   PARTICIPATION LIMITATIONS: meal prep, cleaning, laundry, driving, shopping, community activity, occupation, and fitness   PERSONAL FACTORS Time since onset of injury/illness/exacerbation are also affecting patient's functional outcome.    REHAB POTENTIAL: Good   CLINICAL DECISION MAKING: Stable/uncomplicated   EVALUATION COMPLEXITY: Low     GOALS: Goals reviewed with patient? Yes   SHORT TERM GOALS: Target date: 05/15/2022   Will determine need for heel lift Baseline: Goal status: INITIAL   2.  Pt will verbalize compliance with stretching regimen Baseline:  Goal status: INITIAL     LONG TERM GOALS: Target date: 06/23/22   Pt will demo full lumbar AROM without pain Baseline: pain in Rt SB, significant discomfort noted in flexion-pulling into scapular region Goal status: INITIAL   2.  Pt will tolerate a supine position without sacral pain Baseline:  Goal status: INITIAL   3.  FOTO to meet goal Baseline:  Goal status: INITIAL   4.  Pt will tolerate short distances of straight line jogging, on level surface, without  limitation by LBP Baseline:  Goal status: INITIAL   5.  Will demo proper bending/lifting mechanics Baseline:  Goal status: INITIAL       PLAN: PT FREQUENCY: 2x/week   PT DURATION: 8 weeks   PLANNED INTERVENTIONS: Therapeutic exercises, Therapeutic activity, Neuromuscular re-education, Balance training, Gait training, Patient/Family education, Self Care, Joint mobilization, Stair training, Aquatic Therapy, Dry Needling, Spinal mobilization, Cryotherapy, Moist heat, Taping, Ionotophoresis '4mg'$ /ml Dexamethasone, Manual therapy, and Re-evaluation.   PLAN FOR NEXT SESSION: discuss heel lift and adjust PRN, test hip abd strength with dynamometer      Lynden Ang, PT 04/27/2022, 4:38 PM

## 2022-04-26 DIAGNOSIS — K5900 Constipation, unspecified: Secondary | ICD-10-CM | POA: Diagnosis not present

## 2022-04-26 DIAGNOSIS — R632 Polyphagia: Secondary | ICD-10-CM | POA: Diagnosis not present

## 2022-04-26 DIAGNOSIS — R7301 Impaired fasting glucose: Secondary | ICD-10-CM | POA: Diagnosis not present

## 2022-04-26 DIAGNOSIS — I10 Essential (primary) hypertension: Secondary | ICD-10-CM | POA: Diagnosis not present

## 2022-04-27 ENCOUNTER — Ambulatory Visit (HOSPITAL_BASED_OUTPATIENT_CLINIC_OR_DEPARTMENT_OTHER): Payer: BC Managed Care – PPO | Admitting: Physical Therapy

## 2022-04-27 DIAGNOSIS — R262 Difficulty in walking, not elsewhere classified: Secondary | ICD-10-CM | POA: Diagnosis not present

## 2022-04-27 DIAGNOSIS — M25551 Pain in right hip: Secondary | ICD-10-CM

## 2022-04-27 DIAGNOSIS — M5459 Other low back pain: Secondary | ICD-10-CM

## 2022-04-27 DIAGNOSIS — M545 Low back pain, unspecified: Secondary | ICD-10-CM | POA: Diagnosis not present

## 2022-04-27 DIAGNOSIS — G8929 Other chronic pain: Secondary | ICD-10-CM | POA: Diagnosis not present

## 2022-04-27 DIAGNOSIS — M25561 Pain in right knee: Secondary | ICD-10-CM | POA: Diagnosis not present

## 2022-05-08 NOTE — Therapy (Unsigned)
OUTPATIENT PHYSICAL THERAPY TREATMENT NOTE   Patient Name: Gabrielle Terry MRN: [id number] DOB:07-25-1960, 61 y.o., female Today's Date: 05/09/2022   REFERRING PROVIDER: [name], PA-C  END OF SESSION:   PT End of Session - 05/09/22 1604     Visit Number 3    Number of Visits 17    Date for PT Re-Evaluation 06/23/22    Authorization Type BC[employer] [id number]    PT Start Time 1600    PT Stop Time 1640    PT Time Calculation (min) 40 min    Activity Tolerance Patient tolerated treatment well    Behavior During Therapy [facility] for tasks assessed/performed              Past Medical History:  Diagnosis Date   Arthritis    knees   Asthma 2009   Breast hematoma 2011   Right   Colon polyp 2013   3   Constipation    Cough    from sinus drainage   COVID-19 virus infection 07/2019   Diffuse cystic mastopathy    Foot cramps    GERD (gastroesophageal reflux disease)    High cholesterol    Hypercholesteremia    Hypertension    Ulcer    Past Surgical History:  Procedure Laterality Date   ABDOMINAL HYSTERECTOMY  1997   BREAST SURGERY Right 2011   hematoma   [place]  2013   3 polyps/ Dr Jamal Collin   COLONOSCOPY WITH PROPOFOL N/A 03/21/2017   Procedure: COLONOSCOPY WITH PROPOFOL;  Surgeon: Christene Lye, MD;  Location: [facility] ENDOSCOPY;  Service: Endoscopy;  Laterality: N/A;   ESOPHAGOGASTRODUODENOSCOPY (EGD) WITH PROPOFOL N/A 01/26/2016   Procedure: ESOPHAGOGASTRODUODENOSCOPY (EGD) WITH PROPOFOL;  Surgeon: Manya Silvas, MD;  Location: [facility] END[facility]COPY;  Service: Endoscopy;  Laterality: N/A;   ETHMOIDECTOMY Bilateral 04/29/2015   Procedure: TOTAL ETHMOIDECTOMY;  Surgeon: Margaretha Sheffield, MD;  Location: [place];  Service: ENT;  Laterality: Bilateral;   FRONTAL SINUS EXPLORATION Bilateral 04/29/2015   Procedure: FRONTAL SINUS EXPLORATION;  Surgeon: Margaretha Sheffield, MD;  Location: [place];  Service: ENT;  Laterality: Bilateral;    IMAGE GUIDED SINUS SURGERY N/A 04/29/2015   Procedure: IMAGE GUIDED SINUS SURGERY;  Surgeon: Margaretha Sheffield, MD;  Location: [place];  Service: ENT;  Laterality: N/A;  GAVE DISK TO CE CE   MAXILLARY ANTROSTOMY Bilateral 04/29/2015   Procedure: MAXILLARY ANTROSTOMY WITH REMOVAL OF CONTENTS;  Surgeon: Margaretha Sheffield, MD;  Location: [place];  Service: ENT;  Laterality: Bilateral;   [place]  2013   salpingo oophorectmy  1997   SPHENOIDECTOMY Bilateral 04/29/2015   Procedure: SPHENOIDECTOMY;  Surgeon: Margaretha Sheffield, MD;  Location: [place];  Service: ENT;  Laterality: Bilateral;   TUBAL LIGATION     UTERINE FIBROID SURGERY     removed   Patient Active Problem List   Diagnosis Date Noted   Severe persistent asthma without complication 10/62/6948   Seasonal and perennial allergic rhinitis 12/28/2021   Impaired fasting glucose 11/21/2021   Chronic pain of right knee 02/23/2021   Class 1 obesity with serious comorbidity and body mass index (BMI) of 34.0 to 34.9 in adult 02/23/2021   Polyphagia 11/03/2020   Class 2 severe obesity due to excess calories with serious comorbidity and body mass index (BMI) of 37.0 to 37.9 in adult ([place]) 09/23/2019   Pneumonia due to COVID-19 virus 08/16/2019   Acute  hypoxemic respiratory failure due to severe acute respiratory syndrome coronavirus 2 (SARS-CoV-2) disease ([place]) 08/16/2019   Essential hypertension    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   AB (asthmatic bronchitis) 10/14/2015   Abnormal bruising 10/14/2015   Toxic effect of venom 10/14/2015   Airway hyperreactivity 10/14/2015   Acid reflux 10/14/2015   Elective abortion 10/14/2015   Hypercholesteremia 10/14/2015   Cannot sleep 10/14/2015   Hemorrhoids, internal 10/14/2015   Climacteric 10/14/2015   Localized superficial swelling, mass, or lump 10/14/2015   Obesity (BMI 30.0-34.9) 10/14/2015   Plantar fasciitis 10/14/2015    Allergic rhinitis 04/07/2015   Arthritis of knee, degenerative 12/18/2014   Diffuse cystic mastopathy 01/28/2014   Personal history of colonic polyps 01/09/2013   Mild intermittent asthma 2009    REFERRING DIAG: M54.50 (ICD-10-CM) - Midline low back pain without sciatica, unspecified chronicity  THERAPY DIAG:  Other low back pain  Pain in right hip  Chronic pain of right knee  Difficulty in walking, not elsewhere classified  Rationale for Evaluation and Treatment Rehabilitation  PERTINENT HISTORY: Gynecological surgeries  PRECAUTIONS: None  SUBJECTIVE: Pt reports no changes since last session.   Initially started when lifting my infant form sleep 30 yr ago. I have to lay on my sides, cannot lay supine or prone. Floor exercises hurt tailbone and sometimes my Rt shoulder blade. Did 2 visits to chirpractor, no adjustments, just attached to TENS. Rt sciatic pain every now and then.   PAIN:  Are you having pain? Yes: NPRS scale: 2/10 Pain location: across low back, rt hip and knee Pain description: quick, stabbing Aggravating factors: laying supine Relieving factors: sidelying    OBJECTIVE:    DIAGNOSTIC FINDINGS:  Rt knee xray 01/03/20: IMPRESSION: Osteoarthritic change, most pronounced in the patellofemoral joint. No evident fracture, dislocation, or joint effusion.   No available imaging for spine/pelvis   PATIENT SURVEYS:  FOTO 47     MUSCLE LENGTH: Overall limited flexibility   POSTURE:  seated with Rt leg crossed over left during subjective questioning Rounded shoulders Rt arch collapse   PALPATION: TTP Rt SIJ   LUMBAR ROM:    Active  A/PROM  eval  Flexion    Extension    Right lateral flexion    Left lateral flexion P!  Right rotation    Left rotation     (Blank rows = not tested)   LOWER EXTREMITY ROM:      Passive  Right eval Left eval  Hip flexion      Hip extension      Hip abduction      Hip adduction      Hip internal rotation       Hip external rotation      Knee flexion      Knee extension -5 0  Ankle dorsiflexion      Ankle plantarflexion      Ankle inversion      Ankle eversion       (Blank rows = not tested)   LOWER EXTREMITY MMT:     MMT Right eval Left eval  Hip flexion      Hip extension      Hip abduction      Hip adduction      Hip internal rotation      Hip external rotation      Knee flexion      Knee extension      Ankle dorsiflexion      Ankle plantarflexion  Ankle inversion      Ankle eversion       (Blank rows = not tested)     GAIT: Lt LE vaulting       TODAY'S TREATMENT  Treatment                            05/09/22 :  - Nustep 5 min level 5.  - Further discussion about pt and past medical history and symptoms. After discussion pt requested to truncate session as she would like to be transferred to pelvic floor PT. Treatment                            04/27/22 :  - Nustep 5 min level 5.  - Seated Hamstring Stretch  - 1 x daily - 7 x weekly - 5 breaths hold - Seated Piriformis Stretch with Trunk Bend  - Seated Hip Flexor Stretch  - Seated Hip Adductor Stretch  - Standing Gastroc Stretch  - Standing 'L' Stretch at Counter  - PPTx15  - LTR x10 Bilat  - Bridges x20 - Sidelying clams with green theraband 2x 10 bilat  - quadruped hip abduction 2x10 bilat  - Cat/ cow 10x 5 sec hold - Squats from hi- low table 2x5     Treatment                            04/24/22 (EVAL):   3-layer heel lift added to Rt heel - Seated Hamstring Stretch  - 1 x daily - 7 x weekly - 5 breaths hold - Seated Piriformis Stretch with Trunk Bend  - Seated Hip Flexor Stretch  - Seated Hip Adductor Stretch  - Standing Gastroc Stretch  - Standing 'L' Stretch at [employer]    PATIENT EDUCATION:  Education details: Anatomy of condition, POC, HEP, exercise form/rationale Person educated: Patient Education method: Explanation, Demonstration, Tactile cues, Verbal cues, and Handouts Education  comprehension: verbalized understanding, returned demonstration, verbal cues required, tactile cues required, and needs further education     HOME EXERCISE PROGRAM: [id number] Stretches: choose 2 to do at breakfast, lunch, dinner   ASSESSMENT:   CLINICAL IMPRESSION: After further discussion pt is being transferred to pelvic floor PT. She has a history of bowel issues that were never addressed resulting in her taking mirlax daily. She is very active and has not benefited from outpatient ortho. Pt to follow up with [facility] pelvic floor.     OBJECTIVE IMPAIRMENTS Abnormal gait, decreased activity tolerance, difficulty walking, decreased ROM, decreased strength, impaired perceived functional ability, increased muscle spasms, impaired flexibility, improper body mechanics, postural dysfunction, and pain.    ACTIVITY LIMITATIONS carrying, lifting, bending, sitting, standing, squatting, sleeping, stairs, bed mobility, and locomotion level   PARTICIPATION LIMITATIONS: meal prep, cleaning, laundry, driving, shopping, community activity, occupation, and fitness   PERSONAL FACTORS Time since onset of injury/illness/exacerbation are also affecting patient's functional outcome.    REHAB POTENTIAL: Good   CLINICAL DECISION MAKING: Stable/uncomplicated   EVALUATION COMPLEXITY: Low     GOALS: Goals reviewed with patient? Yes   SHORT TERM GOALS: Target date: 05/15/2022   Will determine need for heel lift Baseline: Goal status: INITIAL   2.  Pt will verbalize compliance with stretching regimen Baseline:  Goal status: INITIAL     LONG TERM GOALS: Target date: 06/23/22   Pt will demo full  lumbar AROM without pain Baseline: pain in Rt SB, significant discomfort noted in flexion-pulling into scapular region Goal status: INITIAL   2.  Pt will tolerate a supine position without sacral pain Baseline:  Goal status: INITIAL   3.  FOTO to meet goal Baseline:  Goal status: INITIAL   4.  Pt  will tolerate short distances of straight line jogging, on level surface, without limitation by LBP Baseline:  Goal status: INITIAL   5.  Will demo proper bending/lifting mechanics Baseline:  Goal status: INITIAL       PLAN: PT FREQUENCY: 2x/week   PT DURATION: 8 weeks   PLANNED INTERVENTIONS: Therapeutic exercises, Therapeutic activity, Neuromuscular re-education, Balance training, Gait training, Patient/Family education, Self Care, Joint mobilization, Stair training, Aquatic Therapy, Dry Needling, Spinal mobilization, Cryotherapy, Moist heat, Taping, Ionotophoresis '4mg'$ /ml Dexamethasone, Manual therapy, and Re-evaluation.   PLAN FOR NEXT SESSION: Transfer to pelvic floor.     Lynden Ang, PT 05/09/2022, 4:05 PM

## 2022-05-09 ENCOUNTER — Encounter (HOSPITAL_BASED_OUTPATIENT_CLINIC_OR_DEPARTMENT_OTHER): Payer: Self-pay | Admitting: Physical Therapy

## 2022-05-09 ENCOUNTER — Ambulatory Visit (HOSPITAL_BASED_OUTPATIENT_CLINIC_OR_DEPARTMENT_OTHER): Payer: BC Managed Care – PPO | Admitting: Physical Therapy

## 2022-05-09 DIAGNOSIS — M5459 Other low back pain: Secondary | ICD-10-CM

## 2022-05-09 DIAGNOSIS — M25551 Pain in right hip: Secondary | ICD-10-CM

## 2022-05-09 DIAGNOSIS — G8929 Other chronic pain: Secondary | ICD-10-CM

## 2022-05-09 DIAGNOSIS — R262 Difficulty in walking, not elsewhere classified: Secondary | ICD-10-CM

## 2022-05-11 ENCOUNTER — Encounter (HOSPITAL_BASED_OUTPATIENT_CLINIC_OR_DEPARTMENT_OTHER): Payer: Self-pay

## 2022-05-11 ENCOUNTER — Ambulatory Visit (HOSPITAL_BASED_OUTPATIENT_CLINIC_OR_DEPARTMENT_OTHER): Payer: BC Managed Care – PPO | Admitting: Physical Therapy

## 2022-05-12 ENCOUNTER — Ambulatory Visit: Payer: BC Managed Care – PPO | Attending: Physician Assistant | Admitting: Physical Therapy

## 2022-05-12 DIAGNOSIS — M25561 Pain in right knee: Secondary | ICD-10-CM | POA: Insufficient documentation

## 2022-05-12 DIAGNOSIS — G8929 Other chronic pain: Secondary | ICD-10-CM | POA: Insufficient documentation

## 2022-05-12 DIAGNOSIS — R269 Unspecified abnormalities of gait and mobility: Secondary | ICD-10-CM | POA: Diagnosis not present

## 2022-05-12 DIAGNOSIS — M25551 Pain in right hip: Secondary | ICD-10-CM | POA: Insufficient documentation

## 2022-05-12 DIAGNOSIS — R262 Difficulty in walking, not elsewhere classified: Secondary | ICD-10-CM | POA: Diagnosis not present

## 2022-05-12 DIAGNOSIS — M5459 Other low back pain: Secondary | ICD-10-CM | POA: Insufficient documentation

## 2022-05-12 DIAGNOSIS — M545 Low back pain, unspecified: Secondary | ICD-10-CM | POA: Insufficient documentation

## 2022-05-12 DIAGNOSIS — M62838 Other muscle spasm: Secondary | ICD-10-CM | POA: Insufficient documentation

## 2022-05-12 NOTE — Therapy (Addendum)
OUTPATIENT PHYSICAL THERAPY TREATMENT NOTE   Patient Name: Gabrielle Terry MRN: [id number] DOB:03-Nov-1960, 61 y.o., female Today's Date: 05/12/2022   REFERRING PROVIDER: [name], PA-C  END OF SESSION:   PT End of Session - 05/12/22 0802     Visit Number 4    Number of Visits 17    Date for PT Re-Evaluation 06/23/22    Authorization Type [employer] [id number]    PT Start Time 0802    PT Stop Time 0845    PT Time Calculation (min) 43 min    Activity Tolerance Patient tolerated treatment well    Behavior During Therapy [place] for tasks assessed/performed               Past Medical History:  Diagnosis Date   Arthritis    knees   Asthma 2009   Breast hematoma 2011   Right   Colon polyp 2013   3   Constipation    Cough    from sinus drainage   COVID-19 virus infection 07/2019   Diffuse cystic mastopathy    Foot cramps    GERD (gastroesophageal reflux disease)    High cholesterol    Hypercholesteremia    Hypertension    Ulcer    Past Surgical History:  Procedure Laterality Date   ABDOMINAL HYSTERECTOMY  1997   BREAST SURGERY Right 2011   hematoma   [place]  2013   3 polyps/ Dr Jamal Collin   COLONOSCOPY WITH PROPOFOL N/A 03/21/2017   Procedure: COLONOSCOPY WITH PROPOFOL;  Surgeon: Christene Lye, MD;  Location: [facility] ENDOSCOPY;  Service: Endoscopy;  Laterality: N/A;   ESOPHAGOGASTRODUODENOSCOPY (EGD) WITH PROPOFOL N/A 01/26/2016   Procedure: ESOPHAGOGASTRODUODENOSCOPY (EGD) WITH PROPOFOL;  Surgeon: Manya Silvas, MD;  Location: [facility] ENDOSCOPY;  Service: Endoscopy;  Laterality: N/A;   ETHMOIDECTOMY Bilateral 04/29/2015   Procedure: TOTAL ETHMOIDECTOMY;  Surgeon: Margaretha Sheffield, MD;  Location: [place];  Service: ENT;  Laterality: Bilateral;   FRONTAL SINUS EXPLORATION Bilateral 04/29/2015   Procedure: FRONTAL SINUS EXPLORATION;  Surgeon: Margaretha Sheffield, MD;  Location: [place];  Service: ENT;  Laterality:  Bilateral;   IMAGE GUIDED SINUS SURGERY N/A 04/29/2015   Procedure: IMAGE GUIDED SINUS SURGERY;  Surgeon: Margaretha Sheffield, MD;  Location: [place];  Service: ENT;  Laterality: N/A;  GAVE DISK TO CE CE   MAXILLARY ANTROSTOMY Bilateral 04/29/2015   Procedure: MAXILLARY ANTROSTOMY WITH REMOVAL OF CONTENTS;  Surgeon: Margaretha Sheffield, MD;  Location: [place];  Service: ENT;  Laterality: Bilateral;   [place]  2013   salpingo oophorectmy  1997   SPHENOIDECTOMY Bilateral 04/29/2015   Procedure: SPHENOIDECTOMY;  Surgeon: Margaretha Sheffield, MD;  Location: [place];  Service: ENT;  Laterality: Bilateral;   TUBAL LIGATION     UTERINE FIBROID SURGERY     removed   Patient Active Problem List   Diagnosis Date Noted   Severe persistent asthma without complication 68/06/7516   Seasonal and perennial allergic rhinitis 12/28/2021   Impaired fasting glucose 11/21/2021   Chronic pain of right knee 02/23/2021   Class 1 obesity with serious comorbidity and body mass index (BMI) of 34.0 to 34.9 in adult 02/23/2021   Polyphagia 11/03/2020   Class 2 severe obesity due to excess calories with serious comorbidity and body mass index (BMI) of 37.0 to 37.9 in adult [facility]) 09/23/2019   Pneumonia due to COVID-19 virus 08/16/2019  Acute hypoxemic respiratory failure due to severe acute respiratory syndrome coronavirus 2 (SARS-CoV-2) disease ([place]) 08/16/2019   Essential hypertension    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   AB (asthmatic bronchitis) 10/14/2015   Abnormal bruising 10/14/2015   Toxic effect of venom 10/14/2015   Airway hyperreactivity 10/14/2015   Acid reflux 10/14/2015   Elective abortion 10/14/2015   Hypercholesteremia 10/14/2015   Cannot sleep 10/14/2015   Hemorrhoids, internal 10/14/2015   Climacteric 10/14/2015   Localized superficial swelling, mass, or lump 10/14/2015   Obesity (BMI 30.0-34.9) 10/14/2015   Plantar fasciitis  10/14/2015   Allergic rhinitis 04/07/2015   Arthritis of knee, degenerative 12/18/2014   Diffuse cystic mastopathy 01/28/2014   Personal history of colonic polyps 01/09/2013   Mild intermittent asthma 2009    REFERRING DIAG: M54.50 (ICD-10-CM) - Midline low back pain without sciatica, unspecified chronicity  THERAPY DIAG:  Other low back pain  Pain in right hip  Chronic pain of right knee  Rationale for Evaluation and Treatment Rehabilitation  PERTINENT HISTORY: Gynecological surgeries  PRECAUTIONS: None  SUBJECTIVE:   SUBJECTIVE STATEMENT: Lower back pain for years. She has been working out and and has notice back pain more intense with laying flat exercise. Tried physical therapy for low back and pain and was referred to Pelvic PT. When she walks she feels like she notices noises in between her legs.   Fluid intake: Yes: 40-60 ounces a day    PAIN:  Are you having pain? Yes NPRS scale: 2/10 Pain location: right hip and lower back   Pain type: aching and dull Pain description: intermittent   Aggravating factors: lay on your back,  Relieving factors: laying on side   PRECAUTIONS: None  WEIGHT BEARING RESTRICTIONS: No  FALLS:  Has patient fallen in last 6 months? No  PATIENT GOALS: no more pain in   PERTINENT HISTORY:  Sexual abuse: No  BOWEL MOVEMENT: Pain with bowel movement: No Type of bowel movement:Type (Bristol Stool Scale) soft and Strain Yes not ever time- was told she has a redundant colon Fully empty rectum: No Leakage: No Pads: No Fiber supplement: Yes: miralax and linzess  URINATION: Pain with urination: No Fully empty bladder: Yes: some days it just takes a long time  Stream: Strong Urgency: No Frequency: 4-5 Leakage: no  INTERCOURSE: Pain with intercourse: no Ability to have vaginal penetration:  Yes: maybe/ not currently sexually active     PREGNANCY: C-section deliveries 2 Currently pregnant No  PROLAPSE:   None  OBJECTIVE:    DIAGNOSTIC FINDINGS:  Rt knee xray 01/03/20: IMPRESSION: Osteoarthritic change, most pronounced in the patellofemoral joint. No evident fracture, dislocation, or joint effusion.   No available imaging for spine/pelvis   PATIENT SURVEYS:  FOTO 47     MUSCLE LENGTH: Overall limited flexibility   POSTURE:  seated with Rt leg crossed over left during subjective questioning Rounded shoulders Rt arch collapse   PALPATION: TTP Rt SIJ   LUMBAR ROM:    Active  A/PROM  eval 05/12/2022  Flexion     Extension   P!  Right lateral flexion     Left lateral flexion P!   Right rotation     Left rotation      (Blank rows = not tested)   LOWER EXTREMITY ROM:       Update:05/12/2022  Passive  Right eval Left eval Right  05/12/2022 Left  05/12/2022  Hip flexion   WFL 75%  Hip extension  Hip abduction        Hip adduction        Hip internal rotation   50% 50%  Hip external rotation   75% 50%  Knee flexion        Knee extension -5 0    Ankle dorsiflexion        Ankle plantarflexion        Ankle inversion        Ankle eversion         (Blank rows = not tested)   LOWER EXTREMITY MMT:     Updated:05/12/2022  MMT Right 05/12/2022 Left 05/12/2022  Hip flexion 3+ /5 3+/5  Hip extension      Hip abduction  5/5 5/5  Hip adduction  5/5 5/5   Hip internal rotation  5/5 4/5   Hip external rotation 5/5  5/5  Knee flexion      Knee extension      Ankle dorsiflexion      Ankle plantarflexion      Ankle inversion      Ankle eversion       (Blank rows = not tested)  GAIT: Lt LE vaulting  Assessed date: 05/12/2022  Posture:  Lateral shift towards the right   Pelvic alignment: -Right anterior pelvic obliquity  -Supine- left leg slightly more internal rotated     PALPATION:   General : tenderness to muscle attachments along the pelvis, Scar tissue restricted under the umbilicus, right sided paraspinal tightness                 External  Perineal Exam - adductor insertion tightness                              Internal Pelvic Floor- Tenderness along Bil urethra, Right obturator internus   Patient confirms identification and approves PT to assess internal pelvic floor and treatment Yes No emotional/communication barriers or cognitive limitation. Patient is motivated to learn. Patient understands and agrees with treatment goals and plan. PT explains patient will be examined in standing, sitting, and lying down to see how their muscles and joints work. When they are ready, they will be asked to remove their underwear so PT can examine their perineum. The patient is also given the option of providing their own chaperone as one is not provided in our facility. The patient also has the right and is explained the right to defer or refuse any part of the evaluation or treatment including the internal exam. With the patient's consent, PT will use one gloved finger to gently assess the muscles of the pelvic floor, seeing how well it contracts and relaxes and if there is muscle symmetry. After, the patient will get dressed and PT and patient will discuss exam findings and plan of care. PT and patient discuss plan of care, schedule, attendance policy and HEP activities.  PELVIC MMT:   MMT eval  Vaginal 3/5, 4 quick flick, 2 second holds    Internal Anal Sphincter   External Anal Sphincter   Puborectalis   Diastasis Recti   (Blank rows = not tested)        TONE: Normal muscle tone  PROLAPSE:   Posterior wall mild not to level of the hymen   TODAY'S TREATMENT   Treatment  05/12/2022:   - Pelvic ReEvaluation    - Toileting technique educated and demonstration   Treatment                            05/09/22 :  - Nustep 5 min level 5.  - Further discussion about pt and past medical history and symptoms. After discussion pt requested to truncate session as she would like to be transferred to pelvic floor  PT. Treatment                            04/27/22 :  - Nustep 5 min level 5.  - Seated Hamstring Stretch  - 1 x daily - 7 x weekly - 5 breaths hold - Seated Piriformis Stretch with Trunk Bend  - Seated Hip Flexor Stretch  - Seated Hip Adductor Stretch  - Standing Gastroc Stretch  - Standing 'L' Stretch at Counter  - PPTx15  - LTR x10 Bilat  - Bridges x20 - Sidelying clams with green theraband 2x 10 bilat  - quadruped hip abduction 2x10 bilat  - Cat/ cow 10x 5 sec hold - Squats from hi- low table 2x5     Treatment                            04/24/22 (EVAL):   3-layer heel lift added to Rt heel - Seated Hamstring Stretch  - 1 x daily - 7 x weekly - 5 breaths hold - Seated Piriformis Stretch with Trunk Bend  - Seated Hip Flexor Stretch  - Seated Hip Adductor Stretch  - Standing Gastroc Stretch  - Standing 'L' Stretch at [employer]    PATIENT EDUCATION:  Education details: toileting techniques  Person educated: Patient Education method: Explanation, Demonstration, Tactile cues, Verbal cues, and Handouts Education comprehension: verbalized understanding     HOME EXERCISE PROGRAM: [id number] Stretches: choose 2 to do at breakfast, lunch, dinner   ASSESSMENT:   CLINICAL IMPRESSION: Patient was a friendly 61 year female presenting to clinic with chronic low back and hip pain. She previously tried ortho physical therapy and was referred to pelvic PT to further assess her hip and back pain. The physical examination noticed a right lateral shift , leg discrepancy, and right paraspinal tightness with forward flexion. Patient had pain and weakness with bil hip flexion and pain with lumbar extension. During the internal examination, patient had bilateral tenderness beside the urethra and had a 3/5 strength, 4 quick flicks and 2 second hold. Patient demonstrated a lack of strength and endurance with pelvic floor muscles. Patient was educated and provided a handout on toileting  techniques.Patient would benefit from continued skilled therapy to work on strength , coordination , and pain management for a better quality of life.     OBJECTIVE IMPAIRMENTS Abnormal gait, decreased activity tolerance, difficulty walking, decreased ROM, decreased strength, impaired perceived functional ability, increased muscle spasms, impaired flexibility, improper body mechanics, postural dysfunction, and pain.    ACTIVITY LIMITATIONS carrying, lifting, bending, sitting, standing, squatting, sleeping, stairs, bed mobility, and locomotion level   PARTICIPATION LIMITATIONS: meal prep, cleaning, laundry, driving, shopping, community activity, occupation, and fitness   PERSONAL FACTORS Time since onset of injury/illness/exacerbation are also affecting patient's functional outcome.    REHAB POTENTIAL: Good   CLINICAL DECISION MAKING: Stable/uncomplicated   EVALUATION COMPLEXITY: Low     GOALS: Goals reviewed with  patient? Yes   SHORT TERM GOALS: Target date: 05/15/2022   Will determine need for heel lift Baseline: Goal status: INITIAL   2.  Pt will verbalize compliance with stretching regimen Baseline:  Goal status: INITIAL     LONG TERM GOALS: Target date: 06/23/22  Updated: 05/12/2022  Pt will demo full lumbar AROM without pain Baseline: pain in Rt SB, significant discomfort noted in flexion-pulling into scapular region Goal status: INITIAL   2.  Pt will tolerate a supine position without sacral pain Baseline:  Goal status: INITIAL   3.  FOTO to meet goal Baseline:  Goal status: INITIAL   4.  Pt will tolerate short distances of straight line jogging, on level surface, without limitation by LBP Baseline:  Goal status: INITIAL   5.  Will demo proper bending/lifting mechanics Baseline:  Goal status: INITIAL   6.  Pt will report her BMs are complete due to improved bowel habits and evacuation techniques.  Baseline:  Goal status: INITIAL   7.  Pt will report 75%  reduction of pain due to improvements in posture, strength, and muscle length  Baseline:  Goal status: INITIAL   8.  Pt to demonstrate at least 4/5 pelvic floor strength for improved pelvic stability and decreased strain at pelvic floor/ decrease leakage.  Baseline: 3/5 Goal status: INITIAL      PLAN: PT FREQUENCY: 2x/week   PT DURATION: 8 weeks   PLANNED INTERVENTIONS: Therapeutic exercises, Therapeutic activity, Neuromuscular re-education, Balance training, Gait training, Patient/Family education, Self Care, Joint mobilization, Stair training, Aquatic Therapy, Dry Needling, Spinal mobilization, Cryotherapy, Moist heat, Taping, Ionotophoresis '4mg'$ /ml Dexamethasone, Manual therapy, bio-feedback and Re-evaluation.   PLAN FOR NEXT SESSION: scar mobility, STM to lumber extensor, F/U on toileting techniques, core strengthening     Katelyne Galster, Student-PT 05/12/2022, 9:11 AM  I agree with the following treatment note after reviewing documentation. This session was performed under the supervision of a licensed clinician.  Gustavus Bryant, PT 05/12/22 11:37 AM

## 2022-05-16 ENCOUNTER — Ambulatory Visit (HOSPITAL_BASED_OUTPATIENT_CLINIC_OR_DEPARTMENT_OTHER): Payer: BC Managed Care – PPO | Admitting: Physical Therapy

## 2022-05-17 NOTE — Therapy (Unsigned)
OUTPATIENT PHYSICAL THERAPY TREATMENT NOTE   Patient Name: Gabrielle Terry MRN: [id number] DOB:[birth year], 61 y.o., female Today's Date: 05/18/2022   REFERRING PROVIDER: [name], PA-C  END OF SESSION:   PT End of Session - 05/18/22 1023     Visit Number 5    Number of Visits 17    Date for PT Re-Evaluation 06/23/22    Authorization Type [employer] [id number]    PT Start Time 1015    PT Stop Time 1100    PT Time Calculation (min) 45 min    Activity Tolerance Patient tolerated treatment well    Behavior During Therapy [facility] for tasks assessed/performed                Past Medical History:  Diagnosis Date   Arthritis    knees   Asthma 2009   Breast hematoma 2011   Right   Colon polyp 2013   3   Constipation    Cough    from sinus drainage   COVID-19 virus infection 07/2019   Diffuse cystic mastopathy    Foot cramps    GERD (gastroesophageal reflux disease)    High cholesterol    Hypercholesteremia    Hypertension    Ulcer    Past Surgical History:  Procedure Laterality Date   ABDOMINAL HYSTERECTOMY  1997   BREAST SURGERY Right 2011   hematoma   [place]  2013   3 polyps/ Dr Jamal Collin   COLONOSCOPY WITH PROPOFOL N/A 03/21/2017   Procedure: COLONOSCOPY WITH PROPOFOL;  Surgeon: Christene Lye, MD;  Location: [facility] ENDOSCOPY;  Service: Endoscopy;  Laterality: N/A;   ESOPHAGOGASTRODUODENOSCOPY (EGD) WITH PROPOFOL N/A 01/26/2016   Procedure: ESOPHAGOGASTRODUODENOSCOPY (EGD) WITH PROPOFOL;  Surgeon: Manya Silvas, MD;  Location: [facility] ENDOSCOPY;  Service: Endoscopy;  Laterality: N/A;   ETHMOIDECTOMY Bilateral 04/29/2015   Procedure: TOTAL ETHMOIDECTOMY;  Surgeon: Margaretha Sheffield, MD;  Location: [place];  Service: ENT;  Laterality: Bilateral;   FRONTAL SINUS EXPLORATION Bilateral 04/29/2015   Procedure: FRONTAL SINUS EXPLORATION;  Surgeon: Margaretha Sheffield, MD;  Location: [place];  Service: ENT;  Laterality:  Bilateral;   IMAGE GUIDED SINUS SURGERY N/A 04/29/2015   Procedure: IMAGE GUIDED SINUS SURGERY;  Surgeon: Margaretha Sheffield, MD;  Location: [place];  Service: ENT;  Laterality: N/A;  GAVE DISK TO CE CE   MAXILLARY ANTROSTOMY Bilateral 04/29/2015   Procedure: MAXILLARY ANTROSTOMY WITH REMOVAL OF CONTENTS;  Surgeon: Margaretha Sheffield, MD;  Location: [place];  Service: ENT;  Laterality: Bilateral;   [employer]  2013   salpingo oophorectmy  1997   SPHENOIDECTOMY Bilateral 04/29/2015   Procedure: SPHENOIDECTOMY;  Surgeon: Margaretha Sheffield, MD;  Location: [place];  Service: ENT;  Laterality: Bilateral;   TUBAL LIGATION     UTERINE FIBROID SURGERY     removed   Patient Active Problem List   Diagnosis Date Noted   Severe persistent asthma without complication 40/98/1191   Seasonal and perennial allergic rhinitis 12/28/2021   Impaired fasting glucose 11/21/2021   Chronic pain of right knee 02/23/2021   Class 1 obesity with serious comorbidity and body mass index (BMI) of 34.0 to 34.9 in adult 02/23/2021   Polyphagia 11/03/2020   Class 2 severe obesity due to excess calories with serious comorbidity and body mass index (BMI) of 37.0 to 37.9 in adult [facility]) 09/23/2019   Pneumonia due to COVID-19 virus 08/16/2019  Acute hypoxemic respiratory failure due to severe acute respiratory syndrome coronavirus 2 (SARS-CoV-2) disease ([place]) 08/16/2019   Essential hypertension    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   AB (asthmatic bronchitis) 10/14/2015   Abnormal bruising 10/14/2015   Toxic effect of venom 10/14/2015   Airway hyperreactivity 10/14/2015   Acid reflux 10/14/2015   Elective abortion 10/14/2015   Hypercholesteremia 10/14/2015   Cannot sleep 10/14/2015   Hemorrhoids, internal 10/14/2015   Climacteric 10/14/2015   Localized superficial swelling, mass, or lump 10/14/2015   Obesity (BMI 30.0-34.9) 10/14/2015   Plantar fasciitis  10/14/2015   Allergic rhinitis 04/07/2015   Arthritis of knee, degenerative 12/18/2014   Diffuse cystic mastopathy 01/28/2014   Personal history of colonic polyps 01/09/2013   Mild intermittent asthma 2009    REFERRING DIAG: M54.50 (ICD-10-CM) - Midline low back pain without sciatica, unspecified chronicity  THERAPY DIAG:  Other low back pain  Pain in right hip  Chronic pain of right knee  Difficulty in walking, not elsewhere classified  Rationale for Evaluation and Treatment Rehabilitation  PERTINENT HISTORY: Gynecological surgeries  PRECAUTIONS: None  SUBJECTIVE:   SUBJECTIVE STATEMENT: I still am not walking because I can hear the noise in my pubic region   Fluid intake: Yes: 40-60 ounces a day    PAIN:  Are you having pain? No  PRECAUTIONS: None  WEIGHT BEARING RESTRICTIONS: No  FALLS:  Has patient fallen in last 6 months? No  PATIENT GOALS: no more pain in   PERTINENT HISTORY:  Sexual abuse: No  BOWEL MOVEMENT: Pain with bowel movement: No Type of bowel movement:Type (Bristol Stool Scale) soft and Strain Yes not ever time- was told she has a redundant colon Fully empty rectum: No Leakage: No Pads: No Fiber supplement: Yes: miralax and linzess  URINATION: Pain with urination: No Fully empty bladder: Yes: some days it just takes a long time  Stream: Strong Urgency: No Frequency: 4-5 Leakage: no  INTERCOURSE: Pain with intercourse: no Ability to have vaginal penetration:  Yes: maybe/ not currently sexually active     PREGNANCY: C-section deliveries 2 Currently pregnant No  PROLAPSE:  None  OBJECTIVE:    DIAGNOSTIC FINDINGS:  Rt knee xray 01/03/20: IMPRESSION: Osteoarthritic change, most pronounced in the patellofemoral joint. No evident fracture, dislocation, or joint effusion.   No available imaging for spine/pelvis   PATIENT SURVEYS:  FOTO 47     MUSCLE LENGTH: Overall limited flexibility   POSTURE:  seated with Rt leg  crossed over left during subjective questioning Rounded shoulders Rt arch collapse   PALPATION: TTP Rt SIJ   LUMBAR ROM:    Active  A/PROM  eval 05/12/2022  Flexion     Extension   P!  Right lateral flexion     Left lateral flexion P!   Right rotation     Left rotation      (Blank rows = not tested)   LOWER EXTREMITY ROM:       Update:05/12/2022  Passive  Right eval Left eval Right  05/12/2022 Left  05/12/2022  Hip flexion   WFL 75%  Hip extension        Hip abduction        Hip adduction        Hip internal rotation   50% 50%  Hip external rotation   75% 50%  Knee flexion        Knee extension -5 0    Ankle dorsiflexion  Ankle plantarflexion        Ankle inversion        Ankle eversion         (Blank rows = not tested)   LOWER EXTREMITY MMT:     Updated:05/12/2022  MMT Right 05/12/2022 Left 05/12/2022  Hip flexion 3+ /5 3+/5  Hip extension      Hip abduction  5/5 5/5  Hip adduction  5/5 5/5   Hip internal rotation  5/5 4/5   Hip external rotation 5/5  5/5  Knee flexion      Knee extension      Ankle dorsiflexion      Ankle plantarflexion      Ankle inversion      Ankle eversion       (Blank rows = not tested)  GAIT: Lt LE vaulting  Assessed date: 05/12/2022  Posture:  Lateral shift towards the right   Pelvic alignment: -Right anterior pelvic obliquity  -Supine- left leg slightly more internal rotated     PALPATION:   General : tenderness to muscle attachments along the pelvis, Scar tissue restricted under the umbilicus, right sided paraspinal tightness                 External Perineal Exam - adductor insertion tightness                              Internal Pelvic Floor- Tenderness along Bil urethra, Right obturator internus   Patient confirms identification and approves PT to assess internal pelvic floor and treatment Yes No emotional/communication barriers or cognitive limitation. Patient is motivated to learn. Patient  understands and agrees with treatment goals and plan. PT explains patient will be examined in standing, sitting, and lying down to see how their muscles and joints work. When they are ready, they will be asked to remove their underwear so PT can examine their perineum. The patient is also given the option of providing their own chaperone as one is not provided in our facility. The patient also has the right and is explained the right to defer or refuse any part of the evaluation or treatment including the internal exam. With the patient's consent, PT will use one gloved finger to gently assess the muscles of the pelvic floor, seeing how well it contracts and relaxes and if there is muscle symmetry. After, the patient will get dressed and PT and patient will discuss exam findings and plan of care. PT and patient discuss plan of care, schedule, attendance policy and HEP activities.  PELVIC MMT:   MMT eval  Vaginal 3/5, 4 quick flick, 2 second holds    Internal Anal Sphincter   External Anal Sphincter   Puborectalis   Diastasis Recti   (Blank rows = not tested)        TONE: Normal muscle tone  PROLAPSE:   Posterior wall mild not to level of the hymen remnants, urethra lower in supine  TODAY'S TREATMENT   Treatment                            05/18/2022:   - Manual: lumbar paraspinals bil, TFL and gluteals on Rt,  internal pelvic to obturator and levators and transverse peroneals Rt side (Patient confirms identification and approves physical therapist to perform internal soft tissue work)   - TFL stretch in sidelying Rt side with contract/relax Self care: - moisturizing  techniques and demo with handout on what to use  Treatment                            05/12/2022:   - Pelvic ReEvaluation    - Toileting technique educated and demonstration   Treatment                            05/09/22 :  - Nustep 5 min level 5.  - Further discussion about pt and past medical history and symptoms. After  discussion pt requested to truncate session as she would like to be transferred to pelvic floor PT.       PATIENT EDUCATION:  Education details: toileting techniques, HEP, moisturizing Person educated: Patient Education method: Explanation, Demonstration, Tactile cues, Verbal cues, and Handouts Education comprehension: verbalized understanding     HOME EXERCISE PROGRAM: [id number] exercise progressions Stretches: choose 2 to do at breakfast, lunch, dinner   ASSESSMENT:   CLINICAL IMPRESSION:  Pt has significant tension in pelvic floor as well as dryness.  She was educated in perineal massage with moisturizer to work on improved soft tissue health. Pt has very tight obturator on the right and transverse peroneus bil.  Both areas released well with STM techniques.  Pt was very tight throughout lumbar erectors, Rt gluteals, Rt TFL.  Muscles released well but pt was also educated on dry needling as she will definitely benefit from adding that to manual treatment if muscles remain tight.  Pt will benefit from skilled PT to continue to address posture and strength impairments.   OBJECTIVE IMPAIRMENTS Abnormal gait, decreased activity tolerance, difficulty walking, decreased ROM, decreased strength, impaired perceived functional ability, increased muscle spasms, impaired flexibility, improper body mechanics, postural dysfunction, and pain.    ACTIVITY LIMITATIONS carrying, lifting, bending, sitting, standing, squatting, sleeping, stairs, bed mobility, and locomotion level   PARTICIPATION LIMITATIONS: meal prep, cleaning, laundry, driving, shopping, community activity, occupation, and fitness   PERSONAL FACTORS Time since onset of injury/illness/exacerbation are also affecting patient's functional outcome.    REHAB POTENTIAL: Good   CLINICAL DECISION MAKING: Stable/uncomplicated   EVALUATION COMPLEXITY: Low     GOALS: Goals reviewed with patient? Yes   SHORT TERM GOALS: Target date:  05/15/2022  updated 05/18/22  Will determine need for heel lift Baseline: Goal status: INITIAL   2.  Pt will verbalize compliance with stretching regimen Baseline:  Goal status: Met     LONG TERM GOALS: Target date: 06/23/22  Updated: 05/12/2022  Pt will demo full lumbar AROM without pain Baseline: pain in Rt SB, significant discomfort noted in flexion-pulling into scapular region Goal status: INITIAL   2.  Pt will tolerate a supine position without sacral pain Baseline:  Goal status: INITIAL   3.  FOTO to meet goal Baseline:  Goal status: INITIAL   4.  Pt will tolerate short distances of straight line jogging, on level surface, without limitation by LBP Baseline:  Goal status: INITIAL   5.  Will demo proper bending/lifting mechanics Baseline:  Goal status: INITIAL   6.  Pt will report her BMs are complete due to improved bowel habits and evacuation techniques.  Baseline:  Goal status: INITIAL   7.  Pt will report 75% reduction of pain due to improvements in posture, strength, and muscle length  Baseline:  Goal status: INITIAL   8.  Pt to demonstrate at least 4/5 pelvic floor strength  for improved pelvic stability and decreased strain at pelvic floor/ decrease leakage.  Baseline: 3/5 Goal status: INITIAL      PLAN: PT FREQUENCY: 2x/week   PT DURATION: 8 weeks   PLANNED INTERVENTIONS: Therapeutic exercises, Therapeutic activity, Neuromuscular re-education, Balance training, Gait training, Patient/Family education, Self Care, Joint mobilization, Stair training, Aquatic Therapy, Dry Needling, Spinal mobilization, Cryotherapy, Moist heat, Taping, Ionotophoresis 60m/ml Dexamethasone, Manual therapy, bio-feedback and Re-evaluation.   PLAN FOR NEXT SESSION: f/u on STM to lumber extensor, TFL, add hip flexor and add dry needling if needed; continue internal STM to Rt obturator and transverse peroneal muscles, abdominal fascial release/scar release    JCamillo Flaming Alexie Samson, PT 05/18/2022, 10:24 AM

## 2022-05-18 ENCOUNTER — Ambulatory Visit: Payer: BC Managed Care – PPO | Admitting: Physical Therapy

## 2022-05-18 ENCOUNTER — Encounter (HOSPITAL_BASED_OUTPATIENT_CLINIC_OR_DEPARTMENT_OTHER): Payer: Self-pay

## 2022-05-18 ENCOUNTER — Encounter (HOSPITAL_BASED_OUTPATIENT_CLINIC_OR_DEPARTMENT_OTHER): Payer: BC Managed Care – PPO | Admitting: Physical Therapy

## 2022-05-18 DIAGNOSIS — M25551 Pain in right hip: Secondary | ICD-10-CM

## 2022-05-18 DIAGNOSIS — M545 Low back pain, unspecified: Secondary | ICD-10-CM | POA: Diagnosis not present

## 2022-05-18 DIAGNOSIS — G8929 Other chronic pain: Secondary | ICD-10-CM

## 2022-05-18 DIAGNOSIS — M5459 Other low back pain: Secondary | ICD-10-CM

## 2022-05-18 DIAGNOSIS — M25561 Pain in right knee: Secondary | ICD-10-CM | POA: Diagnosis not present

## 2022-05-18 DIAGNOSIS — R262 Difficulty in walking, not elsewhere classified: Secondary | ICD-10-CM | POA: Diagnosis not present

## 2022-05-18 DIAGNOSIS — M62838 Other muscle spasm: Secondary | ICD-10-CM | POA: Diagnosis not present

## 2022-05-18 DIAGNOSIS — R269 Unspecified abnormalities of gait and mobility: Secondary | ICD-10-CM | POA: Diagnosis not present

## 2022-05-23 ENCOUNTER — Encounter (HOSPITAL_BASED_OUTPATIENT_CLINIC_OR_DEPARTMENT_OTHER): Payer: BC Managed Care – PPO | Admitting: Physical Therapy

## 2022-05-23 ENCOUNTER — Ambulatory Visit: Payer: BC Managed Care – PPO | Admitting: Physical Therapy

## 2022-05-23 ENCOUNTER — Encounter: Payer: Self-pay | Admitting: Physical Therapy

## 2022-05-23 DIAGNOSIS — M5459 Other low back pain: Secondary | ICD-10-CM | POA: Diagnosis not present

## 2022-05-23 DIAGNOSIS — G8929 Other chronic pain: Secondary | ICD-10-CM

## 2022-05-23 DIAGNOSIS — M25561 Pain in right knee: Secondary | ICD-10-CM | POA: Diagnosis not present

## 2022-05-23 DIAGNOSIS — M545 Low back pain, unspecified: Secondary | ICD-10-CM | POA: Diagnosis not present

## 2022-05-23 DIAGNOSIS — M62838 Other muscle spasm: Secondary | ICD-10-CM | POA: Diagnosis not present

## 2022-05-23 DIAGNOSIS — R262 Difficulty in walking, not elsewhere classified: Secondary | ICD-10-CM

## 2022-05-23 DIAGNOSIS — M25551 Pain in right hip: Secondary | ICD-10-CM | POA: Diagnosis not present

## 2022-05-23 DIAGNOSIS — R269 Unspecified abnormalities of gait and mobility: Secondary | ICD-10-CM | POA: Diagnosis not present

## 2022-05-23 NOTE — Therapy (Signed)
OUTPATIENT PHYSICAL THERAPY TREATMENT NOTE   Patient Name: Gabrielle Terry MRN: [id number] DOB:02/12/1961, 61 y.o., female Today's Date: 05/23/2022   REFERRING PROVIDER: [name], PA-C  END OF SESSION:   PT End of Session - 05/23/22 1010     Visit Number 6    Number of Visits 17    Authorization Type [id number]    PT Start Time 1010    PT Stop Time 0947    PT Time Calculation (min) 54 min    Activity Tolerance Patient tolerated treatment well    Behavior During Therapy [facility] for tasks assessed/performed                 Past Medical History:  Diagnosis Date   Arthritis    knees   Asthma 2009   Breast hematoma 2011   Right   Colon polyp 2013   3   Constipation    Cough    from sinus drainage   COVID-19 virus infection 07/2019   Diffuse cystic mastopathy    Foot cramps    GERD (gastroesophageal reflux disease)    High cholesterol    Hypercholesteremia    Hypertension    Ulcer    Past Surgical History:  Procedure Laterality Date   ABDOMINAL HYSTERECTOMY  1997   BREAST SURGERY Right 2011   hematoma   [place]  2013   3 polyps/ Dr Jamal Collin   COLONOSCOPY WITH PROPOFOL N/A 03/21/2017   Procedure: COLONOSCOPY WITH PROPOFOL;  Surgeon: Christene Lye, MD;  Location: [facility] ENDOSCOPY;  Service: Endoscopy;  Laterality: N/A;   ESOPHAGOGASTRODUODENOSCOPY (EGD) WITH PROPOFOL N/A 01/26/2016   Procedure: ESOPHAGOGASTRODUODENOSCOPY (EGD) WITH PROPOFOL;  Surgeon: Manya Silvas, MD;  Location: [facility] ENDOSCOPY;  Service: Endoscopy;  Laterality: N/A;   ETHMOIDECTOMY Bilateral 04/29/2015   Procedure: TOTAL ETHMOIDECTOMY;  Surgeon: Margaretha Sheffield, MD;  Location: [place];  Service: ENT;  Laterality: Bilateral;   FRONTAL SINUS EXPLORATION Bilateral 04/29/2015   Procedure: FRONTAL SINUS EXPLORATION;  Surgeon: Margaretha Sheffield, MD;  Location: [place];  Service: ENT;  Laterality: Bilateral;   IMAGE GUIDED SINUS SURGERY N/A  04/29/2015   Procedure: IMAGE GUIDED SINUS SURGERY;  Surgeon: Margaretha Sheffield, MD;  Location: [place];  Service: ENT;  Laterality: N/A;  GAVE DISK TO CE CE   MAXILLARY ANTROSTOMY Bilateral 04/29/2015   Procedure: MAXILLARY ANTROSTOMY WITH REMOVAL OF CONTENTS;  Surgeon: Margaretha Sheffield, MD;  Location: [place];  Service: ENT;  Laterality: Bilateral;   [place]  2013   salpingo oophorectmy  1997   SPHENOIDECTOMY Bilateral 04/29/2015   Procedure: SPHENOIDECTOMY;  Surgeon: Margaretha Sheffield, MD;  Location: [place];  Service: ENT;  Laterality: Bilateral;   TUBAL LIGATION     UTERINE FIBROID SURGERY     removed   Patient Active Problem List   Diagnosis Date Noted   Severe persistent asthma without complication 09/62/8366   Seasonal and perennial allergic rhinitis 12/28/2021   Impaired fasting glucose 11/21/2021   Chronic pain of right knee 02/23/2021   Class 1 obesity with serious comorbidity and body mass index (BMI) of 34.0 to 34.9 in adult 02/23/2021   Polyphagia 11/03/2020   Class 2 severe obesity due to excess calories with serious comorbidity and body mass index (BMI) of 37.0 to 37.9 in adult ([place]) 09/23/2019   Pneumonia due to COVID-19 virus 08/16/2019   Acute hypoxemic respiratory failure due to  severe acute respiratory syndrome coronavirus 2 (SARS-CoV-2) disease ([place]) 08/16/2019   Essential hypertension    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   AB (asthmatic bronchitis) 10/14/2015   Abnormal bruising 10/14/2015   Toxic effect of venom 10/14/2015   Airway hyperreactivity 10/14/2015   Acid reflux 10/14/2015   Elective abortion 10/14/2015   Hypercholesteremia 10/14/2015   Cannot sleep 10/14/2015   Hemorrhoids, internal 10/14/2015   Climacteric 10/14/2015   Localized superficial swelling, mass, or lump 10/14/2015   Obesity (BMI 30.0-34.9) 10/14/2015   Plantar fasciitis 10/14/2015   Allergic rhinitis 04/07/2015    Arthritis of knee, degenerative 12/18/2014   Diffuse cystic mastopathy 01/28/2014   Personal history of colonic polyps 01/09/2013   Mild intermittent asthma 2009    REFERRING DIAG: M54.50 (ICD-10-CM) - Midline low back pain without sciatica, unspecified chronicity  THERAPY DIAG:  Other low back pain  Difficulty in walking, not elsewhere classified  Pain in right hip  Chronic pain of right knee  Rationale for Evaluation and Treatment Rehabilitation  PERTINENT HISTORY: Gynecological surgeries  PRECAUTIONS: None  SUBJECTIVE:   SUBJECTIVE STATEMENT: The pain is getting better and was able to do advance floor exercises. She had some relief with soft tissue.    Fluid intake: Yes: 40-60 ounces a day    PAIN:  Are you having pain? No  PRECAUTIONS: None  WEIGHT BEARING RESTRICTIONS: No  FALLS:  Has patient fallen in last 6 months? No  PATIENT GOALS: no more pain in   PERTINENT HISTORY:  Sexual abuse: No  BOWEL MOVEMENT: Pain with bowel movement: No Type of bowel movement:Type (Bristol Stool Scale) soft and Strain Yes not ever time- was told she has a redundant colon Fully empty rectum: No Leakage: No Pads: No Fiber supplement: Yes: miralax and linzess  URINATION: Pain with urination: No Fully empty bladder: Yes: some days it just takes a long time  Stream: Strong Urgency: No Frequency: 4-5 Leakage: no  INTERCOURSE: Pain with intercourse: no Ability to have vaginal penetration:  Yes: maybe/ not currently sexually active     PREGNANCY: C-section deliveries 2 Currently pregnant No  PROLAPSE:  None  OBJECTIVE:    DIAGNOSTIC FINDINGS:  Rt knee xray 01/03/20: IMPRESSION: Osteoarthritic change, most pronounced in the patellofemoral joint. No evident fracture, dislocation, or joint effusion.   No available imaging for spine/pelvis   PATIENT SURVEYS:  FOTO 47     MUSCLE LENGTH: Overall limited flexibility   POSTURE:  seated with Rt leg crossed  over left during subjective questioning Rounded shoulders Rt arch collapse   PALPATION: TTP Rt SIJ   LUMBAR ROM:    Active  A/PROM  eval 05/12/2022  Flexion     Extension   P!  Right lateral flexion     Left lateral flexion P!   Right rotation     Left rotation      (Blank rows = not tested)   LOWER EXTREMITY ROM:       Update:05/12/2022  Passive  Right eval Left eval Right  05/12/2022 Left  05/12/2022  Hip flexion   WFL 75%  Hip extension        Hip abduction        Hip adduction        Hip internal rotation   50% 50%  Hip external rotation   75% 50%  Knee flexion        Knee extension -5 0    Ankle dorsiflexion  Ankle plantarflexion        Ankle inversion        Ankle eversion         (Blank rows = not tested)   LOWER EXTREMITY MMT:     Updated:05/12/2022  MMT Right 05/12/2022 Left 05/12/2022  Hip flexion 3+ /5 3+/5  Hip extension      Hip abduction  5/5 5/5  Hip adduction  5/5 5/5   Hip internal rotation  5/5 4/5   Hip external rotation 5/5  5/5  Knee flexion      Knee extension      Ankle dorsiflexion      Ankle plantarflexion      Ankle inversion      Ankle eversion       (Blank rows = not tested)  GAIT: Lt LE vaulting  Assessed date: 05/12/2022  Posture:  Lateral shift towards the right   Pelvic alignment: -Right anterior pelvic obliquity  -Supine- left leg slightly more internal rotated     PALPATION:   General : tenderness to muscle attachments along the pelvis, Scar tissue restricted under the umbilicus, right sided paraspinal tightness                 External Perineal Exam - adductor insertion tightness                              Internal Pelvic Floor- Tenderness along Bil urethra, Right obturator internus   Patient confirms identification and approves PT to assess internal pelvic floor and treatment Yes No emotional/communication barriers or cognitive limitation. Patient is motivated to learn. Patient understands and  agrees with treatment goals and plan. PT explains patient will be examined in standing, sitting, and lying down to see how their muscles and joints work. When they are ready, they will be asked to remove their underwear so PT can examine their perineum. The patient is also given the option of providing their own chaperone as one is not provided in our facility. The patient also has the right and is explained the right to defer or refuse any part of the evaluation or treatment including the internal exam. With the patient's consent, PT will use one gloved finger to gently assess the muscles of the pelvic floor, seeing how well it contracts and relaxes and if there is muscle symmetry. After, the patient will get dressed and PT and patient will discuss exam findings and plan of care. PT and patient discuss plan of care, schedule, attendance policy and HEP activities.  PELVIC MMT:   MMT eval  Vaginal 3/5, 4 quick flick, 2 second holds    Internal Anal Sphincter   External Anal Sphincter   Puborectalis   Diastasis Recti   (Blank rows = not tested)        TONE: Normal muscle tone  PROLAPSE:   Posterior wall mild not to level of the hymen remnants, urethra lower in supine  TODAY'S TREATMENT   Treatment                            05/23/2022:   - Manual: lumbar paraspinals bil, TFL and gluteals on Rt,  internal pelvic to obturator and levators and transverse peroneals Rt side (Patient confirms identification and approves physical therapist to perform internal soft tissue work) -self care management- pelvic wand and where to purchase    Treatment  05/18/2022:   - Manual: lumbar paraspinals bil, TFL and gluteals on Rt,  internal pelvic to obturator and levators and transverse peroneals Rt side (Patient confirms identification and approves physical therapist to perform internal soft tissue work)   - TFL stretch in sidelying Rt side with contract/relax Self care: -  moisturizing techniques and demo with handout on what to use  Treatment                            05/12/2022:   - Pelvic ReEvaluation    - Toileting technique educated and demonstration         PATIENT EDUCATION:  Education details: toileting techniques, HEP, moisturizing Person educated: Patient Education method: Consulting civil engineer, Demonstration, Tactile cues, Verbal cues, and Handouts Education comprehension: verbalized understanding     HOME EXERCISE PROGRAM: [id number] exercise progressions Stretches: choose 2 to do at breakfast, lunch, dinner   ASSESSMENT:   CLINICAL IMPRESSION:  Pt is still having continued significant tension in pelvic floor. She was educated on purchasing a pelvic wand to continued soft tissue work at home. Pt has very tight obturator on the right, levator attachments and transverse peroneus bil.  Both areas released well with STM techniques.  Pt was very tight throughout lumbar erectors and thoracic erectors. Muscles released well with soft tissue worked. Pt will benefit from skilled PT to continue to address posture and strength impairments.   OBJECTIVE IMPAIRMENTS Abnormal gait, decreased activity tolerance, difficulty walking, decreased ROM, decreased strength, impaired perceived functional ability, increased muscle spasms, impaired flexibility, improper body mechanics, postural dysfunction, and pain.    ACTIVITY LIMITATIONS carrying, lifting, bending, sitting, standing, squatting, sleeping, stairs, bed mobility, and locomotion level   PARTICIPATION LIMITATIONS: meal prep, cleaning, laundry, driving, shopping, community activity, occupation, and fitness   PERSONAL FACTORS Time since onset of injury/illness/exacerbation are also affecting patient's functional outcome.    REHAB POTENTIAL: Good   CLINICAL DECISION MAKING: Stable/uncomplicated   EVALUATION COMPLEXITY: Low     GOALS: Goals reviewed with patient? Yes   SHORT TERM GOALS: Target date:  05/15/2022  updated 05/18/22  Will determine need for heel lift Baseline: Goal status: INITIAL   2.  Pt will verbalize compliance with stretching regimen Baseline:  Goal status: Met     LONG TERM GOALS: Target date: 06/23/22  Updated: 05/12/2022  Pt will demo full lumbar AROM without pain Baseline: pain in Rt SB, significant discomfort noted in flexion-pulling into scapular region Goal status: INITIAL   2.  Pt will tolerate a supine position without sacral pain Baseline:  Goal status: INITIAL   3.  FOTO to meet goal Baseline:  Goal status: INITIAL   4.  Pt will tolerate short distances of straight line jogging, on level surface, without limitation by LBP Baseline:  Goal status: INITIAL   5.  Will demo proper bending/lifting mechanics Baseline:  Goal status: INITIAL   6.  Pt will report her BMs are complete due to improved bowel habits and evacuation techniques.  Baseline:  Goal status: INITIAL   7.  Pt will report 75% reduction of pain due to improvements in posture, strength, and muscle length  Baseline:  Goal status: INITIAL   8.  Pt to demonstrate at least 4/5 pelvic floor strength for improved pelvic stability and decreased strain at pelvic floor/ decrease leakage.  Baseline: 3/5 Goal status: INITIAL      PLAN: PT FREQUENCY: 2x/week   PT DURATION: 8 weeks   PLANNED  INTERVENTIONS: Therapeutic exercises, Therapeutic activity, Neuromuscular re-education, Balance training, Gait training, Patient/Family education, Self Care, Joint mobilization, Stair training, Aquatic Therapy, Dry Needling, Spinal mobilization, Cryotherapy, Moist heat, Taping, Ionotophoresis 79m/ml Dexamethasone, Manual therapy, bio-feedback and Re-evaluation.   PLAN FOR NEXT SESSION: review goals; f/u on purchasing pelvic wand, TFL, continue internal STM to Rt obturator and transverse peroneal muscles, abdominal fascial release/scar release.     TRosario Jacks Student-PT 05/23/2022  11:10  AM    I agree with the following treatment note after reviewing documentation. This session was performed under the supervision of a licensed clinician.   JGustavus Bryant PT 05/23/22 11:21 AM

## 2022-05-25 DIAGNOSIS — R632 Polyphagia: Secondary | ICD-10-CM | POA: Diagnosis not present

## 2022-05-25 DIAGNOSIS — K5903 Drug induced constipation: Secondary | ICD-10-CM | POA: Diagnosis not present

## 2022-05-29 ENCOUNTER — Encounter (HOSPITAL_BASED_OUTPATIENT_CLINIC_OR_DEPARTMENT_OTHER): Payer: BC Managed Care – PPO | Admitting: Physical Therapy

## 2022-06-01 ENCOUNTER — Ambulatory Visit: Payer: BC Managed Care – PPO | Admitting: Physical Therapy

## 2022-06-05 ENCOUNTER — Encounter (HOSPITAL_BASED_OUTPATIENT_CLINIC_OR_DEPARTMENT_OTHER): Payer: BC Managed Care – PPO | Admitting: Physical Therapy

## 2022-06-08 ENCOUNTER — Encounter: Payer: Self-pay | Admitting: Physical Therapy

## 2022-06-08 ENCOUNTER — Ambulatory Visit: Payer: BC Managed Care – PPO | Attending: Physician Assistant | Admitting: Physical Therapy

## 2022-06-08 ENCOUNTER — Encounter (HOSPITAL_BASED_OUTPATIENT_CLINIC_OR_DEPARTMENT_OTHER): Payer: BC Managed Care – PPO | Admitting: Physical Therapy

## 2022-06-08 DIAGNOSIS — M5459 Other low back pain: Secondary | ICD-10-CM | POA: Diagnosis not present

## 2022-06-08 DIAGNOSIS — G8929 Other chronic pain: Secondary | ICD-10-CM | POA: Diagnosis not present

## 2022-06-08 DIAGNOSIS — M25551 Pain in right hip: Secondary | ICD-10-CM | POA: Insufficient documentation

## 2022-06-08 DIAGNOSIS — M25561 Pain in right knee: Secondary | ICD-10-CM | POA: Diagnosis not present

## 2022-06-08 DIAGNOSIS — R262 Difficulty in walking, not elsewhere classified: Secondary | ICD-10-CM | POA: Insufficient documentation

## 2022-06-08 NOTE — Therapy (Addendum)
OUTPATIENT PHYSICAL THERAPY TREATMENT NOTE   Patient Name: Gabrielle Terry MRN: [id number] DOB:Dec 17, 1960, 61 y.o., female Today's Date: 06/08/2022   REFERRING PROVIDER: [name], PA-C  END OF SESSION:   Visit #7 Re-eval 06/23/22 Time in: 4098 Time out: 1232 Total time; 43 minutes      Past Medical History:  Diagnosis Date   Arthritis    knees   Asthma 2009   Breast hematoma 2011   Right   Colon polyp 2013   3   Constipation    Cough    from sinus drainage   COVID-19 virus infection 07/2019   Diffuse cystic mastopathy    Foot cramps    GERD (gastroesophageal reflux disease)    High cholesterol    Hypercholesteremia    Hypertension    Ulcer    Past Surgical History:  Procedure Laterality Date   ABDOMINAL HYSTERECTOMY  1997   BREAST SURGERY Right 2011   hematoma   [place]  2013   3 polyps/ Dr Jamal Collin   COLONOSCOPY WITH PROPOFOL N/A 03/21/2017   Procedure: COLONOSCOPY WITH PROPOFOL;  Surgeon: Christene Lye, MD;  Location: [facility] ENDOSCOPY;  Service: Endoscopy;  Laterality: N/A;   ESOPHAGOGASTRODUODENOSCOPY (EGD) WITH PROPOFOL N/A 01/26/2016   Procedure: ESOPHAGOGASTRODUODENOSCOPY (EGD) WITH PROPOFOL;  Surgeon: Manya Silvas, MD;  Location: [facility] ENDOSCOPY;  Service: Endoscopy;  Laterality: N/A;   ETHMOIDECTOMY Bilateral 04/29/2015   Procedure: TOTAL ETHMOIDECTOMY;  Surgeon: Margaretha Sheffield, MD;  Location: [place];  Service: ENT;  Laterality: Bilateral;   FRONTAL SINUS EXPLORATION Bilateral 04/29/2015   Procedure: FRONTAL SINUS EXPLORATION;  Surgeon: Margaretha Sheffield, MD;  Location: [place];  Service: ENT;  Laterality: Bilateral;   IMAGE GUIDED SINUS SURGERY N/A 04/29/2015   Procedure: IMAGE GUIDED SINUS SURGERY;  Surgeon: Margaretha Sheffield, MD;  Location: [place];  Service: ENT;  Laterality: N/A;  GAVE DISK TO CE CE   MAXILLARY ANTROSTOMY Bilateral 04/29/2015   Procedure: MAXILLARY ANTROSTOMY  WITH REMOVAL OF CONTENTS;  Surgeon: Margaretha Sheffield, MD;  Location: [place];  Service: ENT;  Laterality: Bilateral;   [place]  2013   salpingo oophorectmy  1997   SPHENOIDECTOMY Bilateral 04/29/2015   Procedure: SPHENOIDECTOMY;  Surgeon: Margaretha Sheffield, MD;  Location: [place];  Service: ENT;  Laterality: Bilateral;   TUBAL LIGATION     UTERINE FIBROID SURGERY     removed   Patient Active Problem List   Diagnosis Date Noted   Severe persistent asthma without complication 11/91/4782   Seasonal and perennial allergic rhinitis 12/28/2021   Impaired fasting glucose 11/21/2021   Chronic pain of right knee 02/23/2021   Class 1 obesity with serious comorbidity and body mass index (BMI) of 34.0 to 34.9 in adult 02/23/2021   Polyphagia 11/03/2020   Class 2 severe obesity due to excess calories with serious comorbidity and body mass index (BMI) of 37.0 to 37.9 in adult ([place]) 09/23/2019   Pneumonia due to COVID-19 virus 08/16/2019   Acute hypoxemic respiratory failure due to severe acute respiratory syndrome coronavirus 2 (SARS-CoV-2) disease ([place]) 08/16/2019   Essential hypertension    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   AB (asthmatic bronchitis) 10/14/2015   Abnormal bruising 10/14/2015   Toxic effect of venom 10/14/2015   Airway hyperreactivity 10/14/2015   Acid reflux 10/14/2015   Elective abortion 10/14/2015   Hypercholesteremia 10/14/2015   Cannot sleep 10/14/2015   Hemorrhoids,  internal 10/14/2015   Climacteric 10/14/2015   Localized superficial swelling, mass, or lump 10/14/2015   Obesity (BMI 30.0-34.9) 10/14/2015   Plantar fasciitis 10/14/2015   Allergic rhinitis 04/07/2015   Arthritis of knee, degenerative 12/18/2014   Diffuse cystic mastopathy 01/28/2014   Personal history of colonic polyps 01/09/2013   Mild intermittent asthma 2009    REFERRING DIAG: M54.50 (ICD-10-CM) - Midline low back pain without sciatica,  unspecified chronicity  THERAPY DIAG:  No diagnosis found.  Rationale for Evaluation and Treatment Rehabilitation  PERTINENT HISTORY: Gynecological surgeries  PRECAUTIONS: None  SUBJECTIVE:   SUBJECTIVE STATEMENT: The pain is getting better and was able to do advance floor exercises. She had some relief with soft tissue.    Fluid intake: Yes: 40-60 ounces a day    PAIN:  Are you having pain? No  PRECAUTIONS: None  WEIGHT BEARING RESTRICTIONS: No  FALLS:  Has patient fallen in last 6 months? No  PATIENT GOALS: no more pain in   PERTINENT HISTORY:  Sexual abuse: No  BOWEL MOVEMENT: Pain with bowel movement: No Type of bowel movement:Type (Bristol Stool Scale) soft and Strain Yes not ever time- was told she has a redundant colon Fully empty rectum: No Leakage: No Pads: No Fiber supplement: Yes: miralax and linzess  URINATION: Pain with urination: No Fully empty bladder: Yes: some days it just takes a long time  Stream: Strong Urgency: No Frequency: 4-5 Leakage: no  INTERCOURSE: Pain with intercourse: no Ability to have vaginal penetration:  Yes: maybe/ not currently sexually active     PREGNANCY: C-section deliveries 2 Currently pregnant No  PROLAPSE:  None  OBJECTIVE:    DIAGNOSTIC FINDINGS:  Rt knee xray 01/03/20: IMPRESSION: Osteoarthritic change, most pronounced in the patellofemoral joint. No evident fracture, dislocation, or joint effusion.   No available imaging for spine/pelvis   PATIENT SURVEYS:  FOTO 47     MUSCLE LENGTH: Overall limited flexibility   POSTURE:  seated with Rt leg crossed over left during subjective questioning Rounded shoulders Rt arch collapse   PALPATION: TTP Rt SIJ   LUMBAR ROM:    Active  A/PROM  eval 05/12/2022   Flexion      Extension   P! Just feel it pulling center of the sacrum  Right lateral flexion      Left lateral flexion P!    Right rotation      Left rotation       (Blank rows = not  tested)   LOWER EXTREMITY ROM:       Update:05/12/2022  Passive  Right eval Left eval Right  05/12/2022 Left  05/12/2022  Hip flexion   WFL 75%  Hip extension        Hip abduction        Hip adduction        Hip internal rotation   50% 50%  Hip external rotation   75% 50%  Knee flexion        Knee extension -5 0    Ankle dorsiflexion        Ankle plantarflexion        Ankle inversion        Ankle eversion         (Blank rows = not tested)   LOWER EXTREMITY MMT:     Updated:05/12/2022  MMT Right 05/12/2022 Left 05/12/2022  Hip flexion 3+ /5 3+/5  Hip extension      Hip abduction  5/5 5/5  Hip adduction  5/5  5/5   Hip internal rotation  5/5 4/5   Hip external rotation 5/5  5/5  Knee flexion      Knee extension      Ankle dorsiflexion      Ankle plantarflexion      Ankle inversion      Ankle eversion       (Blank rows = not tested)  GAIT: Lt LE vaulting  Assessed date: 05/12/2022  Posture:  Lateral shift towards the right   Pelvic alignment: -Right anterior pelvic obliquity  -Supine- left leg slightly more internal rotated     PALPATION:   General : tenderness to muscle attachments along the pelvis, Scar tissue restricted under the umbilicus, right sided paraspinal tightness                 External Perineal Exam - adductor insertion tightness                              Internal Pelvic Floor- Tenderness along Bil urethra, Right obturator internus   Patient confirms identification and approves PT to assess internal pelvic floor and treatment Yes No emotional/communication barriers or cognitive limitation. Patient is motivated to learn. Patient understands and agrees with treatment goals and plan. PT explains patient will be examined in standing, sitting, and lying down to see how their muscles and joints work. When they are ready, they will be asked to remove their underwear so PT can examine their perineum. The patient is also given the option of  providing their own chaperone as one is not provided in our facility. The patient also has the right and is explained the right to defer or refuse any part of the evaluation or treatment including the internal exam. With the patient's consent, PT will use one gloved finger to gently assess the muscles of the pelvic floor, seeing how well it contracts and relaxes and if there is muscle symmetry. After, the patient will get dressed and PT and patient will discuss exam findings and plan of care. PT and patient discuss plan of care, schedule, attendance policy and HEP activities.  PELVIC MMT:   MMT eval  Vaginal 3/5, 4 quick flick, 2 second holds    Internal Anal Sphincter   External Anal Sphincter   Puborectalis   Diastasis Recti   (Blank rows = not tested)        TONE: Normal muscle tone  PROLAPSE:   Posterior wall mild not to level of the hymen remnants, urethra lower in supine  TODAY'S TREATMENT   Treatment                            06/08/2022: Manual: Patient confirms identification and approves physical therapist to perform internal soft tissue work  Adult nurse and Rt ileococcygeus and coccygeus with LR in hip flexion with movements Exercises: Foam roller to gluteals and soft foam roller under low back with alt LE   Treatment                            05/23/2022:   - Manual: lumbar paraspinals bil, TFL and gluteals on Rt,  internal pelvic to obturator and levators and transverse peroneals Rt side (Patient confirms identification and approves physical therapist to perform internal soft tissue work) -self care management- pelvic wand and where to purchase    Treatment  05/18/2022:   - Manual: lumbar paraspinals bil, TFL and gluteals on Rt,  internal pelvic to obturator and levators and transverse peroneals Rt side (Patient confirms identification and approves physical therapist to perform internal soft tissue work)   - TFL stretch in sidelying Rt side with  contract/relax Self care: - moisturizing techniques and demo with handout on what to use         PATIENT EDUCATION:  Education details: toileting techniques, HEP, moisturizing Person educated: Patient Education method: Consulting civil engineer, Demonstration, Tactile cues, Verbal cues, and Handouts Education comprehension: verbalized understanding     HOME EXERCISE PROGRAM: [id number] exercise progressions Stretches: choose 2 to do at breakfast, lunch, dinner   ASSESSMENT:   CLINICAL IMPRESSION:  Pt is feeling much better but still has pain trying to get to sleep at night. Still a lot of tension in the pelvic floor that released well and was able to get to coccygeus muscle today.  Pt was educated on foam rolling gluteals and lumbar stretch.  Pt has less pain overall and was able to get more knee extension but adductors click over medial knee joint with knee extension.  Pt will benefit from skilled PT to continue to address soft tissue impairments and pain management.   OBJECTIVE IMPAIRMENTS Abnormal gait, decreased activity tolerance, difficulty walking, decreased ROM, decreased strength, impaired perceived functional ability, increased muscle spasms, impaired flexibility, improper body mechanics, postural dysfunction, and pain.    ACTIVITY LIMITATIONS carrying, lifting, bending, sitting, standing, squatting, sleeping, stairs, bed mobility, and locomotion level   PARTICIPATION LIMITATIONS: meal prep, cleaning, laundry, driving, shopping, community activity, occupation, and fitness   PERSONAL FACTORS Time since onset of injury/illness/exacerbation are also affecting patient's functional outcome.    REHAB POTENTIAL: Good   CLINICAL DECISION MAKING: Stable/uncomplicated   EVALUATION COMPLEXITY: Low     GOALS: Goals reviewed with patient? Yes   SHORT TERM GOALS: Target date: 05/15/2022  updated 05/18/22  Will determine need for heel lift Baseline: Goal status: INITIAL   2.  Pt will  verbalize compliance with stretching regimen Baseline:  Goal status: Met     LONG TERM GOALS: Target date: 06/23/22  Updated: 05/12/2022  Pt will demo full lumbar AROM without pain Baseline: pain in Rt SB, significant discomfort noted in flexion-pulling into scapular region Goal status: INITIAL   2.  Pt will tolerate a supine position without sacral pain Baseline: cannot do, stomach is more tolerable Goal status: Ongoing   3.  FOTO to meet goal Baseline:  Goal status: INITIAL   4.  Pt will tolerate short distances of straight line jogging, on level surface, without limitation by LBP Baseline: not attempt yet Goal status: Ongoing   5.  Will demo proper bending/lifting mechanics Baseline:  Goal status: INITIAL   6.  Pt will report her BMs are complete due to improved bowel habits and evacuation techniques.  Baseline: much more complete but not totally Goal status: Ongoing   7.  Pt will report 75% reduction of pain due to improvements in posture, strength, and muscle length  Baseline:  Goal status: INITIAL   8.  Pt to demonstrate at least 4/5 pelvic floor strength for improved pelvic stability and decreased strain at pelvic floor/ decrease leakage.  Baseline: 3/5 Goal status: INITIAL      PLAN: PT FREQUENCY: 2x/week   PT DURATION: 8 weeks   PLANNED INTERVENTIONS: Therapeutic exercises, Therapeutic activity, Neuromuscular re-education, Balance training, Gait training, Patient/Family education, Self Care, Joint mobilization, Stair training, Aquatic Therapy, Dry  Needling, Spinal mobilization, Cryotherapy, Moist heat, Taping, Ionotophoresis 23m/ml Dexamethasone, Manual therapy, bio-feedback and Re-evaluation.   PLAN FOR NEXT SESSION: follow up on pain with falling asleep, bowel movements, continue internal STM and f/u on wand, lumbar release and anterior scar massage (doesn't like needles)    JGustavus Bryant PT 06/08/22 8:01 AM

## 2022-06-13 ENCOUNTER — Encounter (HOSPITAL_BASED_OUTPATIENT_CLINIC_OR_DEPARTMENT_OTHER): Payer: BC Managed Care – PPO | Admitting: Physical Therapy

## 2022-06-19 ENCOUNTER — Encounter (HOSPITAL_BASED_OUTPATIENT_CLINIC_OR_DEPARTMENT_OTHER): Payer: BC Managed Care – PPO | Admitting: Physical Therapy

## 2022-06-21 ENCOUNTER — Ambulatory Visit: Payer: BC Managed Care – PPO | Admitting: Physical Therapy

## 2022-06-21 NOTE — Therapy (Incomplete)
OUTPATIENT PHYSICAL THERAPY TREATMENT NOTE   Patient Name: Gabrielle Terry MRN: [id number] DOB:27-Jun-1961, 61 y.o., female Today's Date: 06/21/2022   REFERRING PROVIDER: [name], PA-C  END OF SESSION:         Past Medical History:  Diagnosis Date   Arthritis    knees   Asthma 2009   Breast hematoma 2011   Right   Colon polyp 2013   3   Constipation    Cough    from sinus drainage   COVID-19 virus infection 07/2019   Diffuse cystic mastopathy    Foot cramps    GERD (gastroesophageal reflux disease)    High cholesterol    Hypercholesteremia    Hypertension    Ulcer    Past Surgical History:  Procedure Laterality Date   ABDOMINAL HYSTERECTOMY  1997   BREAST SURGERY Right 2011   hematoma   CESAREAN SECTION  1985, 1992   COLONOSCOPY  2013   3 polyps/ Dr Jamal Collin   COLONOSCOPY WITH PROPOFOL N/A 03/21/2017   Procedure: COLONOSCOPY WITH PROPOFOL;  Surgeon: Christene Lye, MD;  Location: [facility] ENDOSCOPY;  Service: Endoscopy;  Laterality: N/A;   ESOPHAGOGASTRODUODENOSCOPY (EGD) WITH PROPOFOL N/A 01/26/2016   Procedure: ESOPHAGOGASTRODUODENOSCOPY (EGD) WITH PROPOFOL;  Surgeon: Manya Silvas, MD;  Location: [facility] ENDOSCOPY;  Service: Endoscopy;  Laterality: N/A;   ETHMOIDECTOMY Bilateral 04/29/2015   Procedure: TOTAL ETHMOIDECTOMY;  Surgeon: Margaretha Sheffield, MD;  Location: [place];  Service: ENT;  Laterality: Bilateral;   FRONTAL SINUS EXPLORATION Bilateral 04/29/2015   Procedure: FRONTAL SINUS EXPLORATION;  Surgeon: Margaretha Sheffield, MD;  Location: [place];  Service: ENT;  Laterality: Bilateral;   IMAGE GUIDED SINUS SURGERY N/A 04/29/2015   Procedure: IMAGE GUIDED SINUS SURGERY;  Surgeon: Margaretha Sheffield, MD;  Location: [place];  Service: ENT;  Laterality: N/A;  GAVE DISK TO CE CE   MAXILLARY ANTROSTOMY Bilateral 04/29/2015   Procedure: MAXILLARY ANTROSTOMY WITH REMOVAL OF CONTENTS;  Surgeon: Margaretha Sheffield, MD;  Location: [place];  Service: ENT;  Laterality: Bilateral;   [place]  2013   salpingo oophorectmy  1997   SPHENOIDECTOMY Bilateral 04/29/2015   Procedure: SPHENOIDECTOMY;  Surgeon: Margaretha Sheffield, MD;  Location: [place];  Service: ENT;  Laterality: Bilateral;   TUBAL LIGATION     UTERINE FIBROID SURGERY     removed   Patient Active Problem List   Diagnosis Date Noted   Severe persistent asthma without complication 87/68/1157   Seasonal and perennial allergic rhinitis 12/28/2021   Impaired fasting glucose 11/21/2021   Chronic pain of right knee 02/23/2021   Class 1 obesity with serious comorbidity and body mass index (BMI) of 34.0 to 34.9 in adult 02/23/2021   Polyphagia 11/03/2020   Class 2 severe obesity due to excess calories with serious comorbidity and body mass index (BMI) of 37.0 to 37.9 in adult ([place]) 09/23/2019   Pneumonia due to COVID-19 virus 08/16/2019   Acute hypoxemic respiratory failure due to severe acute respiratory syndrome coronavirus 2 (SARS-CoV-2) disease ([place]) 08/16/2019   Essential hypertension    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   AB (asthmatic bronchitis) 10/14/2015   Abnormal bruising 10/14/2015   Toxic effect of venom 10/14/2015   Airway hyperreactivity 10/14/2015   Acid reflux 10/14/2015   Elective abortion 10/14/2015   Hypercholesteremia 10/14/2015   Cannot sleep 10/14/2015   Hemorrhoids, internal 10/14/2015   Climacteric 10/14/2015   Localized superficial swelling, mass, or  lump 10/14/2015   Obesity (BMI 30.0-34.9) 10/14/2015   Plantar fasciitis 10/14/2015   Allergic rhinitis 04/07/2015   Arthritis of knee, degenerative 12/18/2014   Diffuse cystic mastopathy 01/28/2014   Personal history of colonic polyps 01/09/2013   Mild intermittent asthma 2009    REFERRING DIAG: M54.50 (ICD-10-CM) - Midline low back pain without sciatica, unspecified chronicity  THERAPY DIAG:  No diagnosis found.  Rationale for  Evaluation and Treatment Rehabilitation  PERTINENT HISTORY: Gynecological surgeries  PRECAUTIONS: None  SUBJECTIVE:   SUBJECTIVE STATEMENT: The pain is getting better and was able to do advance floor exercises. She had some relief with soft tissue.    Fluid intake: Yes: 40-60 ounces a day    PAIN:  Are you having pain? No  PRECAUTIONS: None  WEIGHT BEARING RESTRICTIONS: No  FALLS:  Has patient fallen in last 6 months? No  PATIENT GOALS: no more pain in   PERTINENT HISTORY:  Sexual abuse: No  BOWEL MOVEMENT: Pain with bowel movement: No Type of bowel movement:Type (Bristol Stool Scale) soft and Strain Yes not ever time- was told she has a redundant colon Fully empty rectum: No Leakage: No Pads: No Fiber supplement: Yes: miralax and linzess  URINATION: Pain with urination: No Fully empty bladder: Yes: some days it just takes a long time  Stream: Strong Urgency: No Frequency: 4-5 Leakage: no  INTERCOURSE: Pain with intercourse: no Ability to have vaginal penetration:  Yes: maybe/ not currently sexually active     PREGNANCY: C-section deliveries 2 Currently pregnant No  PROLAPSE:  None  OBJECTIVE:    DIAGNOSTIC FINDINGS:  Rt knee xray 01/03/20: IMPRESSION: Osteoarthritic change, most pronounced in the patellofemoral joint. No evident fracture, dislocation, or joint effusion.   No available imaging for spine/pelvis   PATIENT SURVEYS:  FOTO 47     MUSCLE LENGTH: Overall limited flexibility   POSTURE:  seated with Rt leg crossed over left during subjective questioning Rounded shoulders Rt arch collapse   PALPATION: TTP Rt SIJ   LUMBAR ROM:    Active  A/PROM  eval 05/12/2022   Flexion      Extension   P! Just feel it pulling center of the sacrum  Right lateral flexion      Left lateral flexion P!    Right rotation      Left rotation       (Blank rows = not tested)   LOWER EXTREMITY ROM:       Update:05/12/2022  Passive   Right eval Left eval Right  05/12/2022 Left  05/12/2022  Hip flexion   WFL 75%  Hip extension        Hip abduction        Hip adduction        Hip internal rotation   50% 50%  Hip external rotation   75% 50%  Knee flexion        Knee extension -5 0    Ankle dorsiflexion        Ankle plantarflexion        Ankle inversion        Ankle eversion         (Blank rows = not tested)   LOWER EXTREMITY MMT:     Updated:05/12/2022  MMT Right 05/12/2022 Left 05/12/2022  Hip flexion 3+ /5 3+/5  Hip extension      Hip abduction  5/5 5/5  Hip adduction  5/5 5/5   Hip internal rotation  5/5 4/5   Hip external  rotation 5/5  5/5  Knee flexion      Knee extension      Ankle dorsiflexion      Ankle plantarflexion      Ankle inversion      Ankle eversion       (Blank rows = not tested)  GAIT: Lt LE vaulting  Assessed date: 05/12/2022  Posture:  Lateral shift towards the right   Pelvic alignment: -Right anterior pelvic obliquity  -Supine- left leg slightly more internal rotated     PALPATION:   General : tenderness to muscle attachments along the pelvis, Scar tissue restricted under the umbilicus, right sided paraspinal tightness                 External Perineal Exam - adductor insertion tightness                              Internal Pelvic Floor- Tenderness along Bil urethra, Right obturator internus   Patient confirms identification and approves PT to assess internal pelvic floor and treatment Yes No emotional/communication barriers or cognitive limitation. Patient is motivated to learn. Patient understands and agrees with treatment goals and plan. PT explains patient will be examined in standing, sitting, and lying down to see how their muscles and joints work. When they are ready, they will be asked to remove their underwear so PT can examine their perineum. The patient is also given the option of providing their own chaperone as one is not provided in our facility. The  patient also has the right and is explained the right to defer or refuse any part of the evaluation or treatment including the internal exam. With the patient's consent, PT will use one gloved finger to gently assess the muscles of the pelvic floor, seeing how well it contracts and relaxes and if there is muscle symmetry. After, the patient will get dressed and PT and patient will discuss exam findings and plan of care. PT and patient discuss plan of care, schedule, attendance policy and HEP activities.  PELVIC MMT:   MMT eval  Vaginal 3/5, 4 quick flick, 2 second holds    Internal Anal Sphincter   External Anal Sphincter   Puborectalis   Diastasis Recti   (Blank rows = not tested)        TONE: Normal muscle tone  PROLAPSE:   Posterior wall mild not to level of the hymen remnants, urethra lower in supine  TODAY'S TREATMENT  Treatment                            06/08/2022: Manual: Patient confirms identification and approves physical therapist to perform internal soft tissue work  Adult nurse and Rt ileococcygeus and coccygeus with LR in hip flexion with movements Exercises: Foam roller to gluteals and soft foam roller under low back with alt LE  Treatment                            06/08/2022: Manual: Patient confirms identification and approves physical therapist to perform internal soft tissue work  Adult nurse and Rt ileococcygeus and coccygeus with LR in hip flexion with movements Exercises: Foam roller to gluteals and soft foam roller under low back with alt LE   Treatment  05/23/2022:   - Manual: lumbar paraspinals bil, TFL and gluteals on Rt,  internal pelvic to obturator and levators and transverse peroneals Rt side (Patient confirms identification and approves physical therapist to perform internal soft tissue work) -self care management- pelvic wand and where to purchase    Treatment                            05/18/2022:   - Manual: lumbar  paraspinals bil, TFL and gluteals on Rt,  internal pelvic to obturator and levators and transverse peroneals Rt side (Patient confirms identification and approves physical therapist to perform internal soft tissue work)   - TFL stretch in sidelying Rt side with contract/relax Self care: - moisturizing techniques and demo with handout on what to use         PATIENT EDUCATION:  Education details: toileting techniques, HEP, moisturizing Person educated: Patient Education method: Consulting civil engineer, Demonstration, Tactile cues, Verbal cues, and Handouts Education comprehension: verbalized understanding     HOME EXERCISE PROGRAM: [id number] exercise progressions Stretches: choose 2 to do at breakfast, lunch, dinner   ASSESSMENT:   CLINICAL IMPRESSION:  Pt is feeling much better but still has pain trying to get to sleep at night. Still a lot of tension in the pelvic floor that released well and was able to get to coccygeus muscle today.  Pt was educated on foam rolling gluteals and lumbar stretch.  Pt has less pain overall and was able to get more knee extension but adductors click over medial knee joint with knee extension.  Pt will benefit from skilled PT to continue to address soft tissue impairments and pain management.   OBJECTIVE IMPAIRMENTS Abnormal gait, decreased activity tolerance, difficulty walking, decreased ROM, decreased strength, impaired perceived functional ability, increased muscle spasms, impaired flexibility, improper body mechanics, postural dysfunction, and pain.    ACTIVITY LIMITATIONS carrying, lifting, bending, sitting, standing, squatting, sleeping, stairs, bed mobility, and locomotion level   PARTICIPATION LIMITATIONS: meal prep, cleaning, laundry, driving, shopping, community activity, occupation, and fitness   PERSONAL FACTORS Time since onset of injury/illness/exacerbation are also affecting patient's functional outcome.    REHAB POTENTIAL: Good   CLINICAL  DECISION MAKING: Stable/uncomplicated   EVALUATION COMPLEXITY: Low     GOALS: Goals reviewed with patient? Yes   SHORT TERM GOALS: Target date: 05/15/2022  updated 05/18/22  Will determine need for heel lift Baseline: Goal status: INITIAL   2.  Pt will verbalize compliance with stretching regimen Baseline:  Goal status: Met     LONG TERM GOALS: Target date: 06/23/22  Updated: 05/12/2022  Pt will demo full lumbar AROM without pain Baseline: pain in Rt SB, significant discomfort noted in flexion-pulling into scapular region Goal status: INITIAL   2.  Pt will tolerate a supine position without sacral pain Baseline: cannot do, stomach is more tolerable Goal status: Ongoing   3.  FOTO to meet goal Baseline:  Goal status: INITIAL   4.  Pt will tolerate short distances of straight line jogging, on level surface, without limitation by LBP Baseline: not attempt yet Goal status: Ongoing   5.  Will demo proper bending/lifting mechanics Baseline:  Goal status: INITIAL   6.  Pt will report her BMs are complete due to improved bowel habits and evacuation techniques.  Baseline: much more complete but not totally Goal status: Ongoing   7.  Pt will report 75% reduction of pain due to improvements in posture, strength, and muscle  length  Baseline:  Goal status: INITIAL   8.  Pt to demonstrate at least 4/5 pelvic floor strength for improved pelvic stability and decreased strain at pelvic floor/ decrease leakage.  Baseline: 3/5 Goal status: INITIAL      PLAN: PT FREQUENCY: 2x/week   PT DURATION: 8 weeks   PLANNED INTERVENTIONS: Therapeutic exercises, Therapeutic activity, Neuromuscular re-education, Balance training, Gait training, Patient/Family education, Self Care, Joint mobilization, Stair training, Aquatic Therapy, Dry Needling, Spinal mobilization, Cryotherapy, Moist heat, Taping, Ionotophoresis 108m/ml Dexamethasone, Manual therapy, bio-feedback and Re-evaluation.    PLAN FOR NEXT SESSION: follow up on pain with falling asleep, bowel movements, continue internal STM and f/u on wand, lumbar release and anterior scar massage (doesn't like needles)    JGustavus Bryant PT 06/21/22 1:56 PM

## 2022-06-22 ENCOUNTER — Encounter (HOSPITAL_BASED_OUTPATIENT_CLINIC_OR_DEPARTMENT_OTHER): Payer: BC Managed Care – PPO | Admitting: Physical Therapy

## 2022-06-27 ENCOUNTER — Other Ambulatory Visit: Payer: Self-pay | Admitting: *Deleted

## 2022-06-27 DIAGNOSIS — J45909 Unspecified asthma, uncomplicated: Secondary | ICD-10-CM

## 2022-06-27 MED ORDER — FASENRA PEN 30 MG/ML ~~LOC~~ SOAJ: 30.0000 mg | SUBCUTANEOUS | 6 refills | Status: DC

## 2022-06-27 NOTE — Addendum Note (Signed)
Addended by: Carin Hock on: 06/27/2022 03:41 PM   Modules accepted: Orders

## 2022-06-29 ENCOUNTER — Ambulatory Visit: Payer: BC Managed Care – PPO | Attending: Physician Assistant | Admitting: Physical Therapy

## 2022-06-29 ENCOUNTER — Encounter: Payer: Self-pay | Admitting: Physical Therapy

## 2022-06-29 DIAGNOSIS — M25561 Pain in right knee: Secondary | ICD-10-CM | POA: Insufficient documentation

## 2022-06-29 DIAGNOSIS — G8929 Other chronic pain: Secondary | ICD-10-CM | POA: Diagnosis not present

## 2022-06-29 DIAGNOSIS — R262 Difficulty in walking, not elsewhere classified: Secondary | ICD-10-CM | POA: Diagnosis not present

## 2022-06-29 DIAGNOSIS — M25551 Pain in right hip: Secondary | ICD-10-CM | POA: Diagnosis not present

## 2022-06-29 DIAGNOSIS — M5459 Other low back pain: Secondary | ICD-10-CM | POA: Diagnosis not present

## 2022-06-29 NOTE — Therapy (Addendum)
OUTPATIENT PHYSICAL THERAPY TREATMENT NOTE   Patient Name: Gabrielle Terry MRN: [id number] DOB:[birth year], 61 y.o., female Today's Date: 06/29/2022   REFERRING PROVIDER: [name], PA-C  END OF SESSION:   PT End of Session - 06/29/22 1055     Visit Number 8    Number of Visits 17    Date for PT Re-Evaluation 08/24/22    Authorization Type [id number]    PT Start Time 1100    PT Stop Time 1150    PT Time Calculation (min) 50 min    Activity Tolerance Patient tolerated treatment well    Behavior During Therapy [facility] for tasks assessed/performed                  Past Medical History:  Diagnosis Date   Arthritis    knees   Asthma 2009   Breast hematoma 2011   Right   Colon polyp 2013   3   Constipation    Cough    from sinus drainage   COVID-19 virus infection 07/2019   Diffuse cystic mastopathy    Foot cramps    GERD (gastroesophageal reflux disease)    High cholesterol    Hypercholesteremia    Hypertension    Ulcer    Past Surgical History:  Procedure Laterality Date   ABDOMINAL HYSTERECTOMY  1997   BREAST SURGERY Right 2011   hematoma   [place]  2013   3 polyps/ Dr Jamal Collin   COLONOSCOPY WITH PROPOFOL N/A 03/21/2017   Procedure: COLONOSCOPY WITH PROPOFOL;  Surgeon: Christene Lye, MD;  Location: [facility] ENDOSCOPY;  Service: Endoscopy;  Laterality: N/A;   ESOPHAGOGASTRODUODENOSCOPY (EGD) WITH PROPOFOL N/A 01/26/2016   Procedure: ESOPHAGOGASTRODUODENOSCOPY (EGD) WITH PROPOFOL;  Surgeon: Manya Silvas, MD;  Location: [facility] ENDOSCOPY;  Service: Endoscopy;  Laterality: N/A;   ETHMOIDECTOMY Bilateral 04/29/2015   Procedure: TOTAL ETHMOIDECTOMY;  Surgeon: Margaretha Sheffield, MD;  Location: [place];  Service: ENT;  Laterality: Bilateral;   FRONTAL SINUS EXPLORATION Bilateral 04/29/2015   Procedure: FRONTAL SINUS EXPLORATION;  Surgeon: Margaretha Sheffield, MD;  Location: [place];  Service: ENT;  Laterality:  Bilateral;   IMAGE GUIDED SINUS SURGERY N/A 04/29/2015   Procedure: IMAGE GUIDED SINUS SURGERY;  Surgeon: Margaretha Sheffield, MD;  Location: [place];  Service: ENT;  Laterality: N/A;  GAVE DISK TO CE CE   MAXILLARY ANTROSTOMY Bilateral 04/29/2015   Procedure: MAXILLARY ANTROSTOMY WITH REMOVAL OF CONTENTS;  Surgeon: Margaretha Sheffield, MD;  Location: [place];  Service: ENT;  Laterality: Bilateral;   [place]  2013   salpingo oophorectmy  1997   SPHENOIDECTOMY Bilateral 04/29/2015   Procedure: SPHENOIDECTOMY;  Surgeon: Margaretha Sheffield, MD;  Location: [place];  Service: ENT;  Laterality: Bilateral;   TUBAL LIGATION     UTERINE FIBROID SURGERY     removed   Patient Active Problem List   Diagnosis Date Noted   Severe persistent asthma without complication 40/98/1191   Seasonal and perennial allergic rhinitis 12/28/2021   Impaired fasting glucose 11/21/2021   Chronic pain of right knee 02/23/2021   Class 1 obesity with serious comorbidity and body mass index (BMI) of 34.0 to 34.9 in adult 02/23/2021   Polyphagia 11/03/2020   Class 2 severe obesity due to excess calories with serious comorbidity and body mass index (BMI) of 37.0 to 37.9 in adult ([place]) 09/23/2019   Pneumonia due to COVID-19 virus  08/16/2019   Acute hypoxemic respiratory failure due to severe acute respiratory syndrome coronavirus 2 (SARS-CoV-2) disease ([place]) 08/16/2019   Essential hypertension    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   AB (asthmatic bronchitis) 10/14/2015   Abnormal bruising 10/14/2015   Toxic effect of venom 10/14/2015   Airway hyperreactivity 10/14/2015   Acid reflux 10/14/2015   Elective abortion 10/14/2015   Hypercholesteremia 10/14/2015   Cannot sleep 10/14/2015   Hemorrhoids, internal 10/14/2015   Climacteric 10/14/2015   Localized superficial swelling, mass, or lump 10/14/2015   Obesity (BMI 30.0-34.9) 10/14/2015   Plantar fasciitis  10/14/2015   Allergic rhinitis 04/07/2015   Arthritis of knee, degenerative 12/18/2014   Diffuse cystic mastopathy 01/28/2014   Personal history of colonic polyps 01/09/2013   Mild intermittent asthma 2009   PCP: Jerrol Banana., MD  Referring Provider: [name], PA-C   REFERRING DIAG: M54.50 (ICD-10-CM) - Midline low back pain without sciatica, unspecified chronicity  THERAPY DIAG:  Other low back pain  Chronic pain of right knee  Difficulty in walking, not elsewhere classified  Pain in right hip  Rationale for Evaluation and Treatment Rehabilitation  PERTINENT HISTORY: Gynecological surgeries  PRECAUTIONS: None  SUBJECTIVE:   SUBJECTIVE STATEMENT:  06/29/2022:She had the massage on Monday and that helped loosen up her muscles. The exercises are going well. She needs help with the wand. She has noticed improvements and is pleased with her progress so far.   Fluid intake: Yes: 40-60 ounces a day    PAIN:  Are you having pain? No  PRECAUTIONS: None  WEIGHT BEARING RESTRICTIONS: No  FALLS:  Has patient fallen in last 6 months? No  PATIENT GOALS: no more pain in   PERTINENT HISTORY:  Sexual abuse: No  BOWEL MOVEMENT: Pain with bowel movement: No Type of bowel movement:Type (Bristol Stool Scale) soft and Strain Yes not ever time- was told she has a redundant colon Fully empty rectum: No Leakage: No Pads: No Fiber supplement: Yes: miralax and linzess  URINATION: Pain with urination: No Fully empty bladder: Yes: some days it just takes a long time  Stream: Strong Urgency: No Frequency: 4-5 Leakage: no  INTERCOURSE: Pain with intercourse: no Ability to have vaginal penetration:  Yes: maybe/ not currently sexually active     PREGNANCY: C-section deliveries 2 Currently pregnant No  PROLAPSE:  None  OBJECTIVE:    DIAGNOSTIC FINDINGS:  Rt knee xray 01/03/20: IMPRESSION: Osteoarthritic change, most pronounced in the patellofemoral  joint. No evident fracture, dislocation, or joint effusion.   No available imaging for spine/pelvis   PATIENT SURVEYS:  FOTO 47     MUSCLE LENGTH: Overall limited flexibility   POSTURE:  seated with Rt leg crossed over left during subjective questioning Rounded shoulders Rt arch collapse   PALPATION: TTP Rt SIJ   LUMBAR ROM:    Active  A/PROM  eval 05/12/2022   Flexion      Extension   P! Just feel it pulling center of the sacrum  Right lateral flexion      Left lateral flexion P!    Right rotation      Left rotation       (Blank rows = not tested)   LOWER EXTREMITY ROM:       Update:05/12/2022  Passive  Right eval Left eval Right  05/12/2022 Left  05/12/2022  Hip flexion   WFL 75%  Hip extension        Hip abduction  Hip adduction        Hip internal rotation   50% 50%  Hip external rotation   75% 50%  Knee flexion        Knee extension -5 0    Ankle dorsiflexion        Ankle plantarflexion        Ankle inversion        Ankle eversion         (Blank rows = not tested)   LOWER EXTREMITY MMT:     Updated:05/12/2022  MMT Right 05/12/2022 Left 05/12/2022  Hip flexion 3+ /5 3+/5  Hip extension      Hip abduction  5/5 5/5  Hip adduction  5/5 5/5   Hip internal rotation  5/5 4/5   Hip external rotation 5/5  5/5  Knee flexion      Knee extension      Ankle dorsiflexion      Ankle plantarflexion      Ankle inversion      Ankle eversion       (Blank rows = not tested)  GAIT: Lt LE vaulting  Assessed date: 05/12/2022  Posture:  Lateral shift towards the right   Pelvic alignment: -Right anterior pelvic obliquity  -Supine- left leg slightly more internal rotated     PALPATION:   General : tenderness to muscle attachments along the pelvis, Scar tissue restricted under the umbilicus, right sided paraspinal tightness                 External Perineal Exam - adductor insertion tightness                              Internal Pelvic  Floor- Tenderness along Bil urethra, Right obturator internus   Patient confirms identification and approves PT to assess internal pelvic floor and treatment Yes No emotional/communication barriers or cognitive limitation. Patient is motivated to learn. Patient understands and agrees with treatment goals and plan. PT explains patient will be examined in standing, sitting, and lying down to see how their muscles and joints work. When they are ready, they will be asked to remove their underwear so PT can examine their perineum. The patient is also given the option of providing their own chaperone as one is not provided in our facility. The patient also has the right and is explained the right to defer or refuse any part of the evaluation or treatment including the internal exam. With the patient's consent, PT will use one gloved finger to gently assess the muscles of the pelvic floor, seeing how well it contracts and relaxes and if there is muscle symmetry. After, the patient will get dressed and PT and patient will discuss exam findings and plan of care. PT and patient discuss plan of care, schedule, attendance policy and HEP activities.  PELVIC MMT:   MMT eval  Vaginal 3/5, 4 quick flick, 2 second holds    Internal Anal Sphincter   External Anal Sphincter   Puborectalis   Diastasis Recti   (Blank rows = not tested)        TONE: Normal muscle tone  PROLAPSE:   Posterior wall mild not to level of the hymen remnants, urethra lower in supine  TODAY'S TREATMENT  Treatment                            06/29/2022 : Manual: Pelvic  wand demonstration Manual: Patient confirms identification and approves physical therapist to perform internal soft tissue work  Obturator and Left ileococcygeus very tender STM to lumbar paraspinals  Exercises: Supine lumbar rotation  Single leg chest  Cat cow with Diaphragmatic breathing  Childs pose Diaphragmatic breathing   Treatment                             06/08/2022: Manual: Patient confirms identification and approves physical therapist to perform internal soft tissue work  Adult nurse and Rt ileococcygeus and coccygeus with LR in hip flexion with movements Exercises: Foam roller to gluteals and soft foam roller under low back with alt LE   Treatment                            05/23/2022:   - Manual: lumbar paraspinals bil, TFL and gluteals on Rt,  internal pelvic to obturator and levators and transverse peroneals Rt side (Patient confirms identification and approves physical therapist to perform internal soft tissue work) -self care management- pelvic wand and where to purchase         PATIENT EDUCATION:  Education details: toileting techniques, HEP, moisturizing Person educated: Patient Education method: Consulting civil engineer, Demonstration, Tactile cues, Verbal cues, and Handouts Education comprehension: verbalized understanding     HOME EXERCISE PROGRAM: [id number] exercise progressions Stretches: choose 2 to do at breakfast, lunch, dinner   ASSESSMENT:   CLINICAL IMPRESSION: Patient is making great progress with physical therapy. Today's session focused on reassessing her goals and STM to improve muscle tension.  Pt is feeling much better and is no longer reporting any pain or discomfort in her low back. She is having more consistent bowel movements.  She is very pleased with progress she is making. Pt is still having increase muscle tension in her pelvic floor. With STM release of pelvic floor muscle patient has decrease muscle tension. Pt has noticeable lumbar stiffness and had some relief with the Q-PED stretches. With patient positive progress, patient is expected to continue to make improvements with physical therapy by decrease pelvic floor muscle tension and increase functional strength. See goals below for more details on progress and updated goals     OBJECTIVE IMPAIRMENTS Abnormal gait, decreased activity tolerance, difficulty  walking, decreased ROM, decreased strength, impaired perceived functional ability, increased muscle spasms, impaired flexibility, improper body mechanics, postural dysfunction, and pain.    ACTIVITY LIMITATIONS carrying, lifting, bending, sitting, standing, squatting, sleeping, stairs, bed mobility, and locomotion level   PARTICIPATION LIMITATIONS: meal prep, cleaning, laundry, driving, shopping, community activity, occupation, and fitness   PERSONAL FACTORS Time since onset of injury/illness/exacerbation are also affecting patient's functional outcome.    REHAB POTENTIAL: Good   CLINICAL DECISION MAKING: Stable/uncomplicated   EVALUATION COMPLEXITY: Low     GOALS: Goals reviewed with patient? Yes   SHORT TERM GOALS: Target date: 05/15/2022  updated 06/29/2022  Will determine need for heel lift Baseline: Goal status: ONGOING   2.  Pt will verbalize compliance with stretching regimen Baseline:  Goal status: Met     LONG TERM GOALS: Target date: 06/24/23  Updated:06/29/2022   Pt will demo full lumbar AROM without pain Baseline: no tested Goal status: ONGOING    2.  Pt will tolerate a supine position without sacral pain Baseline: Able to tolerate the session in supine during manual treatment  Goal status: Ongoing   3.  FOTO to meet goal  Baseline:  Goal status: ONGOING   4.  Pt will tolerate short distances of straight line jogging, on level surface, without limitation by LBP Baseline: not attempt yet Goal status: Ongoing   5.  Will demo proper bending/lifting mechanics Baseline:  Goal status: ongoing   6.  Pt will report her BMs are complete due to improved bowel habits and evacuation techniques.  Baseline: have BM more consistently Goal status: Ongoing   7.  Pt will report 75% reduction of pain due to improvements in posture, strength, and muscle length  Baseline: Reported no pain last two sessions Goal status: On Going    8.  Pt to demonstrate at least 4/5  pelvic floor strength for improved pelvic stability and decreased strain at pelvic floor/ decrease leakage.  Baseline: 3/5 Goal status: ongoing       PLAN: PT FREQUENCY: 2x/week   PT DURATION: 8 weeks   PLANNED INTERVENTIONS: Therapeutic exercises, Therapeutic activity, Neuromuscular re-education, Balance training, Gait training, Patient/Family education, Self Care, Joint mobilization, Stair training, Aquatic Therapy, Dry Needling, Spinal mobilization, Cryotherapy, Moist heat, Taping, Ionotophoresis 31m/ml Dexamethasone, Manual therapy, bio-feedback and Re-evaluation.   PLAN FOR NEXT SESSION: FOTO today! F/u on Pelvic wand, work on pelvic floor down training, add basic core strengthening , cupping to lumbar spine?, f/u on snow tubing   Dannielle Baskins, Student-PT 06/29/2022  12:03 PM   I agree with the following treatment note after reviewing documentation. This session was performed under the supervision of a licensed clinician.  JGustavus Bryant PT 06/29/22 12:24 PM  PHYSICAL THERAPY DISCHARGE SUMMARY  Visits from Start of Care: 8  Current functional level related to goals / functional outcomes: See above goals   Remaining deficits: See above details   Education / Equipment: HEP   Patient agrees to discharge. Patient goals were partially met. Patient is being discharged due to not returning since the last visit.  JGustavus Bryant PT, DPT 09/04/22 12:26 PM

## 2022-07-04 ENCOUNTER — Ambulatory Visit (INDEPENDENT_AMBULATORY_CARE_PROVIDER_SITE_OTHER): Payer: BC Managed Care – PPO | Admitting: Allergy and Immunology

## 2022-07-04 ENCOUNTER — Other Ambulatory Visit: Payer: Self-pay

## 2022-07-04 ENCOUNTER — Ambulatory Visit: Payer: BC Managed Care – PPO | Admitting: Physical Therapy

## 2022-07-04 VITALS — BP 136/80 | HR 73 | Temp 98.0°F | Resp 18 | Ht 65.0 in | Wt 163.5 lb

## 2022-07-04 DIAGNOSIS — J4550 Severe persistent asthma, uncomplicated: Secondary | ICD-10-CM | POA: Diagnosis not present

## 2022-07-04 DIAGNOSIS — J3089 Other allergic rhinitis: Secondary | ICD-10-CM | POA: Diagnosis not present

## 2022-07-04 DIAGNOSIS — J45909 Unspecified asthma, uncomplicated: Secondary | ICD-10-CM

## 2022-07-04 DIAGNOSIS — R632 Polyphagia: Secondary | ICD-10-CM | POA: Diagnosis not present

## 2022-07-04 DIAGNOSIS — T63481D Toxic effect of venom of other arthropod, accidental (unintentional), subsequent encounter: Secondary | ICD-10-CM | POA: Diagnosis not present

## 2022-07-04 DIAGNOSIS — J302 Other seasonal allergic rhinitis: Secondary | ICD-10-CM

## 2022-07-04 MED ORDER — FLUTICASONE-SALMETEROL 250-50 MCG/ACT IN AEPB: INHALATION_SPRAY | RESPIRATORY_TRACT | 5 refills | Status: DC

## 2022-07-04 MED ORDER — EPINEPHRINE 0.3 MG/0.3ML IJ SOAJ: INTRAMUSCULAR | 3 refills | Status: DC

## 2022-07-04 MED ORDER — ALBUTEROL SULFATE HFA 108 (90 BASE) MCG/ACT IN AERS: 2.0000 | INHALATION_SPRAY | Freq: Four times a day (QID) | RESPIRATORY_TRACT | 2 refills | Status: DC | PRN

## 2022-07-04 NOTE — Progress Notes (Signed)
[name] [place] - [place] - [place] - Gabrielle Terry   Follow-up Note  Referring Provider: Maple Hudson.,* Primary Provider: Harvest Forest, MD Date of Office Visit: 07/04/2022  Subjective:   Gabrielle Terry (DOB: 1961-04-07) is a 61 y.o. 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  I last saw him in this clinic 01 June 2020.  She has really done well with her airway issue while using benralizumab as her major controller agent.  She continues to use Advair mostly once a day but occasionally will go up to twice a day.  Rarely does she need to use any short acting bronchodilator.  It does not sound as though she has required a systemic steroid to treat an exacerbation of asthma.    She has also had no problems with her upper airway and does not use any nasal steroids nor does she ever require any antihistamine.  She continues to carry an EpiPen for her hymenoptera venom hypersensitivity state.  She does not receive the flu vaccine.  She has received 4 COVID vaccines in the past but not this year.  Allergies as of 07/04/2022   No Known Allergies      Medication List    Advair HFA 230-21 MCG/ACT inhaler Generic drug: fluticasone-salmeterol INHALE 2 PUFFS INTO THE LUNGS TWICE DAILY   albuterol 108 (90 Base) MCG/ACT inhaler Commonly known as: VENTOLIN HFA Inhale 2 puffs into the lungs every 6 (six) hours as needed for wheezing or shortness of breath.   EPINEPHrine 0.3 mg/0.3 mL Soaj injection Commonly known as: EPI-PEN Use as directed for life-threatening allergic reaction.   Fasenra Pen 30 MG/ML Soaj Generic drug: Benralizumab Inject 1 mL (30 mg total) into the skin every 8 (eight) weeks.   linaclotide  290 MCG Caps capsule Commonly known as: LINZESS Take 290 mcg by mouth daily.   losartan-hydrochlorothiazide 100-25 MG tablet Commonly known as: HYZAAR TAKE 1 TABLET BY MOUTH DAILY   polyethylene glycol 17 g packet Commonly known as: MIRALAX / GLYCOLAX Take 17 g by mouth daily.   rosuvastatin 20 MG tablet Commonly known as: Crestor Take 1 tablet (20 mg total) by mouth at bedtime.   tretinoin 0.1 % cream Commonly known as: RETIN-A Apply 1 Application topically at bedtime.    Past Medical History:  Diagnosis Date   Arthritis    knees   Asthma 2009   Breast hematoma 2011   Right   Colon polyp 2013   3   Constipation    Cough    from sinus drainage   COVID-19 virus infection 07/2019   Diffuse cystic mastopathy    Foot cramps    GERD (gastroesophageal reflux disease)    High cholesterol    Hypercholesteremia    Hypertension    Ulcer     Past Surgical History:  Procedure Laterality Date   ABDOMINAL HYSTERECTOMY  1997   BREAST SURGERY Right 2011   hematoma   CESAREAN SECTION  1985, 1992   COLONOSCOPY  2013   3 polyps/ Dr Evette Cristal   COLONOSCOPY WITH PROPOFOL N/A 03/21/2017   Procedure: COLONOSCOPY WITH PROPOFOL;  Surgeon: Kieth Brightly, MD;  Location: [facility] ENDOSCOPY;  Service: Endoscopy;  Laterality: N/A;   ESOPHAGOGASTRODUODENOSCOPY (EGD) WITH PROPOFOL N/A 01/26/2016  Procedure: ESOPHAGOGASTRODUODENOSCOPY (EGD) WITH PROPOFOL;  Surgeon: Scot Jun, MD;  Location: [facility] ENDOSCOPY;  Service: Endoscopy;  Laterality: N/A;   ETHMOIDECTOMY Bilateral 04/29/2015   Procedure: TOTAL ETHMOIDECTOMY;  Surgeon: Vernie Murders, MD;  Location: [facility] SURGERY [facility];  Service: ENT;  Laterality: Bilateral;   FRONTAL SINUS EXPLORATION Bilateral 04/29/2015   Procedure: FRONTAL SINUS EXPLORATION;  Surgeon: Vernie Murders, MD;  Location: [facility] SURGERY CNTR;  Service: ENT;  Laterality: Bilateral;   IMAGE GUIDED SINUS SURGERY N/A 04/29/2015   Procedure: IMAGE GUIDED SINUS SURGERY;   Surgeon: Vernie Murders, MD;  Location: [facility] SURGERY CNTR;  Service: ENT;  Laterality: N/A;  GAVE DISK TO CE CE   MAXILLARY ANTROSTOMY Bilateral 04/29/2015   Procedure: MAXILLARY ANTROSTOMY WITH REMOVAL OF CONTENTS;  Surgeon: Vernie Murders, MD;  Location: [facility] SURGERY CNTR;  Service: ENT;  Laterality: Bilateral;   MYOMECTOMY  1991   NASAL SEPTUM SURGERY  2013   salpingo oophorectmy  1997   SPHENOIDECTOMY Bilateral 04/29/2015   Procedure: SPHENOIDECTOMY;  Surgeon: Vernie Murders, MD;  Location: [facility] SURGERY CNTR;  Service: ENT;  Laterality: Bilateral;   TUBAL LIGATION     UTERINE FIBROID SURGERY     removed    Review of systems negative except as noted in HPI / PMHx or noted below:  Review of Systems  Constitutional: Negative.   HENT: Negative.    Eyes: Negative.   Respiratory: Negative.    Cardiovascular: Negative.   Gastrointestinal: Negative.   Genitourinary: Negative.   Musculoskeletal: Negative.   Skin: Negative.   Neurological: Negative.   Endo/Heme/Allergies: Negative.   Psychiatric/Behavioral: Negative.       Objective:   Vitals:   07/04/22 1636  BP: 136/80  Pulse: 73  Resp: 18  Temp: 98 F (36.7 C)  SpO2: 99%   Height: 5\' 5"  (165.1 cm)  Weight: 163 lb 8 oz (74.2 kg)   Physical Exam Constitutional:      Appearance: She is not diaphoretic.  HENT:     Head: Normocephalic.     Right Ear: Tympanic membrane, ear canal and external ear normal.     Left Ear: Tympanic membrane, ear canal and external ear normal.     Nose: Nose normal. No mucosal edema or rhinorrhea.     Mouth/Throat:     Pharynx: Uvula midline. No oropharyngeal exudate.  Eyes:     Conjunctiva/sclera: Conjunctivae normal.  Neck:     Thyroid: No thyromegaly.     Trachea: Trachea normal. No tracheal tenderness or tracheal deviation.  Cardiovascular:     Rate and Rhythm: Normal rate and regular rhythm.     Heart sounds: Normal heart sounds, S1 normal and S2 normal. No murmur heard. Pulmonary:      Effort: No respiratory distress.     Breath sounds: Normal breath sounds. No stridor. No wheezing or rales.  Lymphadenopathy:     Head:     Right side of head: No tonsillar adenopathy.     Left side of head: No tonsillar adenopathy.     Cervical: No cervical adenopathy.  Skin:    Findings: No erythema or rash.     Nails: There is no clubbing.  Neurological:     Mental Status: She is alert.     Diagnostics:    Spirometry was performed and demonstrated an FEV1 of 2.63 at 121 % of predicted.  Assessment and Plan:   1. Asthma, severe persistent, well-controlled   2. Seasonal and perennial allergic rhinitis   3. Anaphylaxis due to  hymenoptera venom, accidental or unintentional, subsequent encounter   4. Asthma, unspecified asthma severity, unspecified whether complicated, unspecified whether persistent    1. Continue benralizumab injections  2.  Continue Advair 250-1 inhalation 1-2 times per day depending on disease activity  3. If needed:   A. Epi-Pen  B. OTC antihistamine  4. Return to clinic in 6 months or earlier if problem  Latesia appears to be doing quite well as long as she remains on benralizumab injections which has resulted in a dramatic improvement regarding control of her very severe asthma.  She has a good understanding about Advair use and appropriate dosing depending on disease activity.  I will see her back in this clinic in 6 months or earlier if there is a problem.  Laurette Schimke, MD Allergy / Immunology [facility]

## 2022-07-04 NOTE — Patient Instructions (Addendum)
  1. Continue benralizumab injections  2.  Continue Advair 250-1 inhalation 1-2 times per day depending on disease activity  3. If needed:   A. Epi-Pen  B. OTC antihistamine  4. Return to clinic in 6 months or earlier if problem

## 2022-07-05 ENCOUNTER — Other Ambulatory Visit: Payer: Self-pay

## 2022-07-05 ENCOUNTER — Encounter: Payer: Self-pay | Admitting: Allergy and Immunology

## 2022-07-05 MED ORDER — ADVAIR DISKUS 250-50 MCG/ACT IN AEPB: 1.0000 | INHALATION_SPRAY | Freq: Two times a day (BID) | RESPIRATORY_TRACT | 5 refills | Status: DC

## 2022-07-11 ENCOUNTER — Ambulatory Visit: Payer: BC Managed Care – PPO | Admitting: Physical Therapy

## 2022-07-11 ENCOUNTER — Encounter: Payer: Self-pay | Admitting: Allergy and Immunology

## 2022-07-11 ENCOUNTER — Telehealth: Payer: Self-pay | Admitting: Allergy and Immunology

## 2022-07-11 NOTE — Telephone Encounter (Signed)
Pt called to report she will be changing insurance and RX plan on January 2024. Pt says new pharmacy will be [facility]. Pt will upload her insurance card and send a message regarding her prescriptions

## 2022-07-20 ENCOUNTER — Other Ambulatory Visit: Payer: Self-pay | Admitting: *Deleted

## 2022-07-20 DIAGNOSIS — J45909 Unspecified asthma, uncomplicated: Secondary | ICD-10-CM

## 2022-07-20 MED ORDER — FASENRA PEN 30 MG/ML ~~LOC~~ SOAJ: 30.0000 mg | SUBCUTANEOUS | 6 refills | Status: DC

## 2022-07-28 ENCOUNTER — Ambulatory Visit: Payer: BC Managed Care – PPO | Admitting: Physical Therapy

## 2022-07-31 ENCOUNTER — Other Ambulatory Visit: Payer: Self-pay

## 2022-07-31 MED ORDER — ALBUTEROL SULFATE HFA 108 (90 BASE) MCG/ACT IN AERS: 2.0000 | INHALATION_SPRAY | RESPIRATORY_TRACT | 1 refills | Status: DC | PRN

## 2022-08-02 ENCOUNTER — Other Ambulatory Visit: Payer: Self-pay | Admitting: Allergy and Immunology

## 2022-08-04 ENCOUNTER — Telehealth: Payer: Self-pay

## 2022-08-04 NOTE — Telephone Encounter (Signed)
Unknown Jim, Pharmacy Technician/[facility]  [phone] - called in - DOB verified - stated PA for Albuterol (Ventolin) was denied.   I requested to be transferred to their Billing Dept to find out why - spoke to [place], Benefits Verification Rep - advised incorrect/rejected NDC# [id number] used and 07/04/22 prescription sent to incorrect pharmacy - [place], [place].  Patrice stated prescription sent on 08/02/22 was sent to correct pharmacy - [place], [place] - is in process of being approved and mailed to patient.  I thanked Patrice for all her assistance.

## 2022-10-19 ENCOUNTER — Ambulatory Visit: Payer: BC Managed Care – PPO | Admitting: Physician Assistant

## 2022-10-26 ENCOUNTER — Other Ambulatory Visit: Payer: Self-pay | Admitting: Internal Medicine

## 2022-10-26 ENCOUNTER — Encounter: Payer: Self-pay | Admitting: Cardiology

## 2022-10-26 DIAGNOSIS — R9389 Abnormal findings on diagnostic imaging of other specified body structures: Secondary | ICD-10-CM | POA: Insufficient documentation

## 2022-10-26 DIAGNOSIS — R7401 Elevation of levels of liver transaminase levels: Secondary | ICD-10-CM

## 2022-10-26 NOTE — Progress Notes (Signed)
Cardiology Office Note   Date:  10/27/2022   ID:  Gabrielle Terry, DOB 13-Jun-1961, MRN [id number]  PCP:  Gabrielle Forest, MD  Cardiologist:   None Referring:  Gabrielle Forest, MD  Chief Complaint  Patient presents with   Elevated coronary calcium     History of Present Illness: Gabrielle Terry is a 62 y.o. female who presents for evaluation of elevated coronary calcium.  She has no prior cardiac history.  She was found to have this on routine screening.  She does have dyslipidemia.  She does not know her family history because she is adopted.  She has not had prior cardiac testing.  She has been exercising routinely and is doing high interval training and not getting any symptoms.  She may have had some fleeting chest discomfort post exercise on occasion.  However, with her vigorous activity she denies any cardiovascular symptoms. The patient denies any new symptoms such as chest discomfort, neck or arm discomfort. There has been no new shortness of breath, PND or orthopnea. There have been no reported palpitations, presyncope or syncope.    Past Medical History:  Diagnosis Date   Arthritis    knees   Asthma 2009   Breast hematoma 2011   Right   Colon polyp 2013   3   COVID-19 virus infection 07/2019   Diffuse cystic mastopathy    GERD (gastroesophageal reflux disease)    Hypercholesteremia    Hypertension    Ulcer     Past Surgical History:  Procedure Laterality Date   ABDOMINAL HYSTERECTOMY  1997   BREAST SURGERY Right 2011   hematoma   CESAREAN SECTION  1985, 1992   COLONOSCOPY  2013   3 polyps/ Dr Evette Cristal   COLONOSCOPY WITH PROPOFOL N/A 03/21/2017   Procedure: COLONOSCOPY WITH PROPOFOL;  Surgeon: Kieth Brightly, MD;  Location: [facility] ENDOSCOPY;  Service: Endoscopy;  Laterality: N/A;   ESOPHAGOGASTRODUODENOSCOPY (EGD) WITH PROPOFOL N/A 01/26/2016   Procedure: ESOPHAGOGASTRODUODENOSCOPY (EGD) WITH PROPOFOL;  Surgeon: Scot Jun, MD;  Location: [facility]  ENDOSCOPY;  Service: Endoscopy;  Laterality: N/A;   ETHMOIDECTOMY Bilateral 04/29/2015   Procedure: TOTAL ETHMOIDECTOMY;  Surgeon: Vernie Murders, MD;  Location: [facility];  Service: ENT;  Laterality: Bilateral;   FRONTAL SINUS EXPLORATION Bilateral 04/29/2015   Procedure: FRONTAL SINUS EXPLORATION;  Surgeon: Vernie Murders, MD;  Location: [facility];  Service: ENT;  Laterality: Bilateral;   IMAGE GUIDED SINUS SURGERY N/A 04/29/2015   Procedure: IMAGE GUIDED SINUS SURGERY;  Surgeon: Vernie Murders, MD;  Location: [facility]ERY [facility];  Service: ENT;  Laterality: N/A;  GAVE DISK TO CE CE   MAXILLARY ANTROSTOMY Bilateral 04/29/2015   Procedure: MAXILLARY ANTROSTOMY WITH REMOVAL OF CONTENTS;  Surgeon: Vernie Murders, MD;  Location: [facility]GERY [facility];  Service: ENT;  Laterality: Bilateral;   MYOMECTOMY  1991   NASAL SEPTUM SURGERY  2013   salpingo oophorectmy  1997   SPHENOIDECTOMY Bilateral 04/29/2015   Procedure: SPHENOIDECTOMY;  Surgeon: Vernie Murders, MD;  Location: [facility];  Service: ENT;  Laterality: Bilateral;   TUBAL LIGATION     UTERINE FIBROID SURGERY     removed     Current Outpatient Medications  Medication Sig Dispense Refill   ADVAIR HFA 230-21 MCG/ACT inhaler INHALE 2 PUFFS INTO THE LUNGS TWICE DAILY 12 g 5   albuterol (VENTOLIN HFA) 108 (90 Base) MCG/ACT inhaler Inhale 2 puffs into the lungs every 6 (six) hours as needed for wheezing or  shortness of breath. 18 g 2   EPINEPHrine 0.3 mg/0.3 mL IJ SOAJ injection Use as directed for life-threatening allergic reaction. 2 each 3   FASENRA PEN 30 MG/ML SOAJ Inject 1 mL (30 mg total) into the skin every 8 (eight) weeks. 1 mL 6   linaclotide (LINZESS) 290 MCG CAPS capsule Take 290 mcg by mouth daily.     losartan-hydrochlorothiazide (HYZAAR) 100-25 MG tablet TAKE 1 TABLET BY MOUTH DAILY 90 tablet 3   polyethylene glycol (MIRALAX / GLYCOLAX) packet Take 17 g by mouth daily.     tretinoin (RETIN-A) 0.1 % cream Apply 1  Application topically at bedtime.     rosuvastatin (CRESTOR) 40 MG tablet Take 1 tablet (40 mg total) by mouth at bedtime. 90 tablet 3   Current Facility-Administered Medications  Medication Dose Route Frequency Provider Last Rate Last Admin   Benralizumab SOSY 30 mg  30 mg Subcutaneous Q8 Thomes Dinning, MD   30 mg at 03/04/21 1619    Allergies:   Patient has no known allergies.    Social History:  The patient  reports that she has never smoked. She has never used smokeless tobacco. She reports that she does not drink alcohol and does not use drugs.   Family History:  The patient's family history includes Asthma in her son; Cancer in her brother; Congestive Heart Failure in her father; Diabetes in her maternal grandmother; Hypertension in her maternal aunt; Other in her brother; Thyroid disease in her maternal aunt. She was adopted.    ROS:  Please see the history of present illness.   Otherwise, review of systems are positive for none.   All other systems are reviewed and negative.    PHYSICAL EXAM: VS:  BP (!) 130/90   Pulse 62   Ht 5\' 5"  (1.651 m)   Wt 170 lb 6.4 oz (77.3 kg)   SpO2 97%   BMI 28.36 kg/m  , BMI Body mass index is 28.36 kg/m. GENERAL:  Well appearing HEENT:  Pupils equal round and reactive, fundi not visualized, oral mucosa unremarkable NECK:  No jugular venous distention, waveform within normal limits, carotid upstroke brisk and symmetric, no bruits, no thyromegaly LYMPHATICS:  No cervical, inguinal adenopathy LUNGS:  Clear to auscultation bilaterally BACK:  No CVA tenderness CHEST:  Unremarkable HEART:  PMI not displaced or sustained,S1 and S2 within normal limits, no S3, no S4, no clicks, no rubs, no murmurs ABD:  Flat, positive bowel sounds normal in frequency in pitch, no bruits, no rebound, no guarding, no midline pulsatile mass, no hepatomegaly, no splenomegaly EXT:  2 plus pulses throughout, no edema, no cyanosis no clubbing SKIN:  No  rashes no nodules NEURO:  Cranial nerves II through XII grossly intact, motor grossly intact throughout PSYCH:  Cognitively intact, oriented to person place and time    EKG:  EKG is ordered today. The ekg ordered today demonstrates sinus rhythm, rate 62, rightward axis, she does have poor anterior R wave progression.  This is unchanged from previous.   Recent Labs: No results found for requested labs within last 365 days.    Lipid Panel    Component Value Date/Time   CHOL 180 03/24/2021 0000   CHOL 191 10/16/2019 1303   TRIG 69 03/24/2021 0000   HDL 42 03/24/2021 0000   HDL 50 10/16/2019 1303   LDLCALC 125 03/24/2021 0000   LDLCALC 131 (H) 10/16/2019 1303      Wt Readings from Last 3 Encounters:  10/27/22 170 lb 6.4 oz (77.3 kg)  07/04/22 163 lb 8 oz (74.2 kg)  03/17/22 164 lb (74.4 kg)      Other studies Reviewed: Additional studies/ records that were reviewed today include: Labs, coronary calcium score. Review of the above records demonstrates:  Please see elsewhere in the note.     ASSESSMENT AND PLAN:  Abnormal CT: She is found to have an elevated coronary calcium score.  The level was 382.  The LAD was 368.  This was 97 percentile.  She has no symptoms.  I am going to screen her with a POET (Plain Old Exercise Treadmill) and we are going to pursue aggressive risk reduction as below.  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  Is 40 mm.  I will follow-up with a CT in 1 year.  Dyslipidemia: The LDL was 81.  Total cholesterol 139.  HDL 48.  I want her LDL to be in the 50s.  I am going to increase her Crestor to 40 mg dai and check an NMR and an LPA and 3 months.  Abnormal EKG: Given the poor anterior R wave progression I am going to check an echocardiogram although I do not strongly suspect previous microinfarction.  Looking for wall motion abnormalities.   Current medicines are reviewed at length with the patient today.  The patient does not have concerns  regarding medicines.  The following changes have been made: As above  Labs/ tests ordered today include:   Orders Placed This Encounter  Procedures   CT ANGIO CHEST AORTA W/CM & OR WO/CM   NMR, lipoprofile   Lipoprotein A (LPA)   EXERCISE TOLERANCE TEST (ETT)   EKG 12-Lead     Disposition:   FU with me in 6 months   Signed, Rollene Rotunda, MD  10/27/2022 12:03 PM    [facility]

## 2022-10-27 ENCOUNTER — Encounter: Payer: Self-pay | Admitting: Cardiology

## 2022-10-27 ENCOUNTER — Ambulatory Visit: Payer: No Typology Code available for payment source | Attending: Cardiology | Admitting: Cardiology

## 2022-10-27 VITALS — BP 130/90 | HR 62 | Ht 65.0 in | Wt 170.4 lb

## 2022-10-27 DIAGNOSIS — I77810 Thoracic aortic ectasia: Secondary | ICD-10-CM

## 2022-10-27 DIAGNOSIS — E7800 Pure hypercholesterolemia, unspecified: Secondary | ICD-10-CM | POA: Diagnosis not present

## 2022-10-27 DIAGNOSIS — R931 Abnormal findings on diagnostic imaging of heart and coronary circulation: Secondary | ICD-10-CM | POA: Diagnosis not present

## 2022-10-27 DIAGNOSIS — R9389 Abnormal findings on diagnostic imaging of other specified body structures: Secondary | ICD-10-CM

## 2022-10-27 DIAGNOSIS — I77819 Aortic ectasia, unspecified site: Secondary | ICD-10-CM

## 2022-10-27 MED ORDER — ROSUVASTATIN CALCIUM 40 MG PO TABS: 40.0000 mg | ORAL_TABLET | Freq: Every day | ORAL | 3 refills | Status: DC

## 2022-10-27 NOTE — Patient Instructions (Addendum)
Medication Instructions:  Your physician has recommended you make the following change in your medication:   -Increase rosuvastatin (crestor) to 40mg  once daily.  *If you need a refill on your cardiac medications before your next appointment, please call your pharmacy*   Lab Work: Your physician recommends that you return for lab work in: 3 months for FASTING Lipid profile & LPa  If you have labs (blood work) drawn today and your tests are completely normal, you will receive your results only by: MyChart Message (if you have MyChart) OR A paper copy in the mail If you have any lab test that is abnormal or we need to change your treatment, we will call you to review the results.   Testing/Procedures: Your physician has requested that you have an echocardiogram. Echocardiography is a painless test that uses sound waves to create images of your heart. It provides your doctor with information about the size and shape of your heart and how well your heart's chambers and valves are working. This procedure takes approximately one hour. There are no restrictions for this procedure. Please do NOT wear cologne, perfume, aftershave, or lotions (deodorant is allowed). Please arrive 15 minutes prior to your appointment time. This procedure will be done at [address] [place]   Your physician has requested that you have an exercise tolerance test.  Please also follow instruction sheet, as given. This will take place at [address] Do not drink or eat foods with caffeine for 24 hours before the test. (Chocolate, coffee, tea, or energy drinks) If you use an inhaler, bring it with you to the test. Do not smoke for 4 hours before the test. Wear comfortable shoes and clothing.   Non-Cardiac CT Angiography (CTA) chest/aorta, is a special type of CT scan that uses a computer to produce multi-dimensional views of major blood vessels throughout the body. In CT angiography, a contrast  material is injected through an IV to help visualize the blood vessels. To be done in 1 year.    Follow-Up: At [facility], you and your health needs are our priority.  As part of our continuing mission to provide you with exceptional heart care, we have created designated Provider Care Teams.  These Care Teams include your primary Cardiologist (physician) and Advanced Practice Providers (APPs -  Physician Assistants and Nurse Practitioners) who all work together to provide you with the care you need, when you need it.  We recommend signing up for the patient portal called "MyChart".  Sign up information is provided on this After Visit Summary.  MyChart is used to connect with patients for Virtual Visits (Telemedicine).  Patients are able to view lab/test results, encounter notes, upcoming appointments, etc.  Non-urgent messages can be sent to your provider as well.   To learn more about what you can do with MyChart, go to [email or web address].    Your next appointment:   6 month(s)  Provider:   Rollene Rotunda, MD

## 2022-10-27 NOTE — Addendum Note (Signed)
Addended by: Bernita Buffy on: 10/27/2022 01:25 PM   Modules accepted: Orders

## 2022-10-27 NOTE — Addendum Note (Signed)
Addended by: Rollene Rotunda on: 10/27/2022 01:25 PM   Modules accepted: Orders

## 2022-11-06 ENCOUNTER — Encounter: Payer: BC Managed Care – PPO | Admitting: Family Medicine

## 2022-11-07 ENCOUNTER — Encounter: Payer: BC Managed Care – PPO | Admitting: Family Medicine

## 2022-11-17 ENCOUNTER — Ambulatory Visit
Admission: RE | Admit: 2022-11-17 | Discharge: 2022-11-17 | Disposition: A | Discharge status: Home / Self Care | Payer: No Typology Code available for payment source | Source: Ambulatory Visit | Attending: Internal Medicine | Admitting: Internal Medicine

## 2022-11-17 DIAGNOSIS — R7401 Elevation of levels of liver transaminase levels: Secondary | ICD-10-CM

## 2022-11-22 ENCOUNTER — Ambulatory Visit (HOSPITAL_COMMUNITY): Payer: No Typology Code available for payment source | Attending: Cardiology

## 2022-11-22 DIAGNOSIS — R9431 Abnormal electrocardiogram [ECG] [EKG]: Secondary | ICD-10-CM

## 2022-11-22 DIAGNOSIS — R9389 Abnormal findings on diagnostic imaging of other specified body structures: Secondary | ICD-10-CM | POA: Diagnosis present

## 2022-11-22 DIAGNOSIS — E7800 Pure hypercholesterolemia, unspecified: Secondary | ICD-10-CM

## 2022-11-22 DIAGNOSIS — R931 Abnormal findings on diagnostic imaging of heart and coronary circulation: Secondary | ICD-10-CM

## 2022-11-22 DIAGNOSIS — I77819 Aortic ectasia, unspecified site: Secondary | ICD-10-CM

## 2022-11-24 LAB — ECHOCARDIOGRAM COMPLETE
Area-P 1/2: 3.37 cm2
S' Lateral: 1.8 cm

## 2022-12-01 ENCOUNTER — Telehealth (HOSPITAL_COMMUNITY): Payer: Self-pay | Admitting: *Deleted

## 2022-12-01 ENCOUNTER — Encounter: Payer: Self-pay | Admitting: *Deleted

## 2022-12-01 NOTE — Telephone Encounter (Signed)
Per DPR left instructions on pts work # for ETT scheduled on 12/07/22.

## 2022-12-07 ENCOUNTER — Ambulatory Visit (HOSPITAL_COMMUNITY): Payer: 59 | Attending: Cardiology

## 2022-12-07 DIAGNOSIS — J45909 Unspecified asthma, uncomplicated: Secondary | ICD-10-CM | POA: Insufficient documentation

## 2022-12-07 DIAGNOSIS — I77819 Aortic ectasia, unspecified site: Secondary | ICD-10-CM | POA: Diagnosis not present

## 2022-12-07 DIAGNOSIS — I10 Essential (primary) hypertension: Secondary | ICD-10-CM | POA: Diagnosis not present

## 2022-12-07 DIAGNOSIS — E785 Hyperlipidemia, unspecified: Secondary | ICD-10-CM | POA: Insufficient documentation

## 2022-12-07 DIAGNOSIS — E7800 Pure hypercholesterolemia, unspecified: Secondary | ICD-10-CM

## 2022-12-07 DIAGNOSIS — R9389 Abnormal findings on diagnostic imaging of other specified body structures: Secondary | ICD-10-CM | POA: Diagnosis present

## 2022-12-07 LAB — EXERCISE TOLERANCE TEST
Angina Index: 0
Duke Treadmill Score: 10
Estimated workload: 11.4
Exercise duration (min): 9 min
Exercise duration (sec): 49 s
MPHR: 159 {beats}/min
Peak HR: 164 {beats}/min
Percent HR: 103 %
Rest HR: 75 {beats}/min
ST Depression (mm): 0 mm

## 2023-01-09 ENCOUNTER — Encounter: Payer: Self-pay | Admitting: Allergy and Immunology

## 2023-01-09 ENCOUNTER — Other Ambulatory Visit: Payer: Self-pay

## 2023-01-09 ENCOUNTER — Ambulatory Visit (INDEPENDENT_AMBULATORY_CARE_PROVIDER_SITE_OTHER): Payer: 59 | Admitting: Allergy and Immunology

## 2023-01-09 VITALS — BP 108/70 | HR 78 | Temp 98.1°F | Resp 16

## 2023-01-09 DIAGNOSIS — J3089 Other allergic rhinitis: Secondary | ICD-10-CM

## 2023-01-09 DIAGNOSIS — J302 Other seasonal allergic rhinitis: Secondary | ICD-10-CM | POA: Diagnosis not present

## 2023-01-09 DIAGNOSIS — T782XXD Anaphylactic shock, unspecified, subsequent encounter: Secondary | ICD-10-CM

## 2023-01-09 DIAGNOSIS — J4550 Severe persistent asthma, uncomplicated: Secondary | ICD-10-CM | POA: Diagnosis not present

## 2023-01-09 DIAGNOSIS — T63481D Toxic effect of venom of other arthropod, accidental (unintentional), subsequent encounter: Secondary | ICD-10-CM | POA: Diagnosis not present

## 2023-01-09 MED ORDER — AIRSUPRA 90-80 MCG/ACT IN AERO: 2.0000 | INHALATION_SPRAY | Freq: Four times a day (QID) | RESPIRATORY_TRACT | 1 refills | Status: DC | PRN

## 2023-01-09 NOTE — Progress Notes (Unsigned)
[place] - [place] - [place] - [place] - [place]   Follow-up Note  Referring Provider: Harvest Forest, MD Primary Provider: Harvest Forest, MD Date of Office Visit: 01/09/2023  Subjective:   Gabrielle Terry (DOB: March 10, 1961) is a 62 y.o. 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  I last saw her in this clinic 04 July 2022.    She has completely tapered off her Advair while she remains on benralizumab injections and she has had 0 respiratory tract symptoms and has had no need to use the short acting bronchodilator and she can exercise without any problem and has not required a systemic steroid or antibiotic for any type of airway issue.  Like wise, she has had absolutely no problems with her nose and does not use a nasal steroid.  She continues with an EpiPen for her hymenoptera venom hypersensitivity state.  She does not receive the flu vaccine.  Allergies as of 01/09/2023   No Known Allergies      Medication List    Advair HFA 230-21 MCG/ACT inhaler Generic drug: fluticasone-salmeterol INHALE 2 PUFFS INTO THE LUNGS TWICE DAILY   albuterol 108 (90 Base) MCG/ACT inhaler Commonly known as: VENTOLIN HFA Inhale 2 puffs into the lungs every 6 (six) hours as needed for wheezing or shortness of breath.   EPINEPHrine 0.3 mg/0.3 mL Soaj injection Commonly known as: EPI-PEN Use as directed for life-threatening allergic reaction.   Fasenra Pen 30 MG/ML Soaj Generic drug: Benralizumab Inject 1 mL (30 mg total) into the skin every 8 (eight) weeks.   linaclotide 290 MCG Caps capsule Commonly known as: LINZESS Take 290 mcg by mouth daily.   losartan-hydrochlorothiazide 100-25 MG tablet Commonly known as:  HYZAAR TAKE 1 TABLET BY MOUTH DAILY What changed: how much to take   polyethylene glycol 17 g packet Commonly known as: MIRALAX / GLYCOLAX Take 17 g by mouth daily.   rosuvastatin 40 MG tablet Commonly known as: Crestor Take 1 tablet (40 mg total) by mouth at bedtime.   tretinoin 0.1 % cream Commonly known as: RETIN-A Apply 1 Application topically at bedtime.   Wegovy 1.7 MG/0.75ML Soaj Generic drug: Semaglutide-Weight Management INJECT 1.7 MG UNDER THE SKIN ONCE WEEKLY Subcutaneous    Past Medical History:  Diagnosis Date   Arthritis    knees   Asthma 2009   Breast hematoma 2011   Right   Colon polyp 2013   3   COVID-19 virus infection 07/2019   Diffuse cystic mastopathy    GERD (gastroesophageal reflux disease)    Hypercholesteremia    Hypertension    Ulcer     Past Surgical History:  Procedure Laterality Date   ABDOMINAL HYSTERECTOMY  1997   BREAST SURGERY Right 2011   hematoma   CESAREAN SECTION  1985, 1992   COLONOSCOPY  2013   3 polyps/ Dr Evette Cristal   COLONOSCOPY WITH PROPOFOL N/A 03/21/2017   Procedure: COLONOSCOPY WITH PROPOFOL;  Surgeon: Kieth Brightly, MD;  Location: [facility] ENDOSCOPY;  Service: Endoscopy;  Laterality: N/A;   ESOPHAGOGASTRODUODENOSCOPY (EGD) WITH PROPOFOL N/A 01/26/2016   Procedure: ESOPHAGOGASTRODUODENOSCOPY (EGD) WITH PROPOFOL;  Surgeon: Scot Jun, MD;  Location: [facility] END[facility];  Service: Endoscopy;  Laterality: N/A;   ETHMOIDECTOMY Bilateral 04/29/2015   Procedure: TOTAL ETHMOIDECTOMY;  Surgeon: Vernie Murders, MD;  Location: [facility];  Service: ENT;  Laterality: Bilateral;   FRONTAL SINUS EXPLORATION Bilateral 04/29/2015   Procedure: FRONTAL SINUS EXPLORATION;  Surgeon: Vernie Murders, MD;  Location: [facility];  Service: ENT;  Laterality: Bilateral;   IMAGE GUIDED SINUS SURGERY N/A 04/29/2015   Procedure: IMAGE GUIDED SINUS SURGERY;  Surgeon: Vernie Murders, MD;  Location: [facility];  Service: ENT;   Laterality: N/A;  GAVE DISK TO CE CE   MAXILLARY ANTROSTOMY Bilateral 04/29/2015   Procedure: MAXILLARY ANTROSTOMY WITH REMOVAL OF CONTENTS;  Surgeon: Vernie Murders, MD;  Location: [facility];  Service: ENT;  Laterality: Bilateral;   MYOMECTOMY  1991   NASAL SEPTUM SURGERY  2013   salpingo oophorectmy  1997   SPHENOIDECTOMY Bilateral 04/29/2015   Procedure: SPHENOIDECTOMY;  Surgeon: Vernie Murders, MD;  Location: [facility];  Service: ENT;  Laterality: Bilateral;   TUBAL LIGATION     UTERINE FIBROID SURGERY     removed    Review of systems negative except as noted in HPI / PMHx or noted below:  Review of Systems  Constitutional: Negative.   HENT: Negative.    Eyes: Negative.   Respiratory: Negative.    Cardiovascular: Negative.   Gastrointestinal: Negative.   Genitourinary: Negative.   Musculoskeletal: Negative.   Skin: Negative.   Neurological: Negative.   Endo/Heme/Allergies: Negative.   Psychiatric/Behavioral: Negative.       Objective:   Vitals:   01/09/23 1608  BP: 108/70  Pulse: 78  Resp: 16  Temp: 98.1 F (36.7 C)  SpO2: 97%          Physical Exam Constitutional:      Appearance: She is not diaphoretic.  HENT:     Head: Normocephalic.     Right Ear: Tympanic membrane, ear canal and external ear normal.     Left Ear: Tympanic membrane, ear canal and external ear normal.     Nose: Nose normal. No mucosal edema or rhinorrhea.     Mouth/Throat:     Pharynx: Uvula midline. No oropharyngeal exudate.  Eyes:     Conjunctiva/sclera: Conjunctivae normal.  Neck:     Thyroid: No thyromegaly.     Trachea: Trachea normal. No tracheal tenderness or tracheal deviation.  Cardiovascular:     Rate and Rhythm: Normal rate and regular rhythm.     Heart sounds: Normal heart sounds, S1 normal and S2 normal. No murmur heard. Pulmonary:     Effort: No respiratory distress.     Breath sounds: Normal breath sounds. No stridor. No wheezing or rales.   Lymphadenopathy:     Head:     Right side of head: No tonsillar adenopathy.     Left side of head: No tonsillar adenopathy.     Cervical: No cervical adenopathy.  Skin:    Findings: No erythema or rash.     Nails: There is no clubbing.  Neurological:     Mental Status: She is alert.     Diagnostics:    Spirometry was performed and demonstrated an FEV1 of 2.88 at 131 % of predicted.  The patient had an Asthma Control Test with the following results: ACT Total Score: 24.    Assessment and Plan:   1. Asthma, severe persistent, well-controlled   2. Seasonal and perennial allergic rhinitis   3. Anaphylaxis due to hymenoptera venom, accidental or unintentional, subsequent encounter    1. Continue Benralizumab injections  2. If needed:   A. Epi-Pen  B. OTC antihistamine  C. AirSupra - 2 inhalations every 6 hours  4. Return to clinic in 6 months or earlier if problem  Florece is doing wonderful while using benralizumab as her controller agent.  She will remain on this biologic agent and she can use a anti-inflammatory rescue medication if needed and of course if she develops a hymenoptera venom hypersensitivity reaction she will use an EpiPen.  I will see her back in this clinic in 6 months or earlier if there is a problem.  Laurette Schimke, MD Allergy / Immunology  [facility]

## 2023-01-09 NOTE — Patient Instructions (Signed)
  1. Continue Benralizumab injections  2. If needed:   A. Epi-Pen  B. OTC antihistamine  C. AirSupra - 2 inhalations every 6 hours  4. Return to clinic in 6 months or earlier if problem

## 2023-01-10 ENCOUNTER — Encounter: Payer: Self-pay | Admitting: Allergy and Immunology

## 2023-02-05 LAB — NMR, LIPOPROFILE
Cholesterol, Total: 137 mg/dL (ref 100–199)
HDL-C: 51 mg/dL (ref >39)
Small LDL Particle Number: 379 nmol/L (ref <=527)
Triglycerides: 45 mg/dL (ref 0–149)

## 2023-02-05 LAB — LIPOPROTEIN A (LPA)

## 2023-02-06 LAB — NMR, LIPOPROFILE
HDL Particle Number: 31.7 umol/L (ref >=30.5)
LDL Particle Number: 937 nmol/L (ref <1000)
LDL Size: 20.9 nm (ref >20.5)
LDL-C (NIH Calc): 76 mg/dL (ref 0–99)
LP-IR Score: <25 (ref <=45)

## 2023-02-08 ENCOUNTER — Encounter: Payer: Self-pay | Admitting: Cardiology

## 2023-02-09 ENCOUNTER — Telehealth: Payer: Self-pay | Admitting: *Deleted

## 2023-02-09 MED ORDER — EZETIMIBE 10 MG PO TABS: 10.0000 mg | ORAL_TABLET | Freq: Every day | ORAL | 3 refills | Status: DC

## 2023-02-09 NOTE — Telephone Encounter (Signed)
-----   Message from Rollene Rotunda sent at 02/07/2023 11:01 AM EDT ----- The LDL is not at target.  I would like to add Zetia 10 mg PO daily.  Disp number [id number] with 3 refills.  Call Ms. Lea-Surgeon with the results and send results to Harvest Forest, MD

## 2023-02-09 NOTE — Telephone Encounter (Signed)
Pt has reviewed results via my chart  New script sent to the pharmacy

## 2023-02-26 ENCOUNTER — Encounter: Payer: Self-pay | Admitting: Cardiology

## 2023-02-26 DIAGNOSIS — R931 Abnormal findings on diagnostic imaging of heart and coronary circulation: Secondary | ICD-10-CM

## 2023-03-20 ENCOUNTER — Encounter: Payer: Self-pay | Admitting: Allergy and Immunology

## 2023-04-09 LAB — LIPID PANEL: Cholesterol, Total: 108 mg/dL (ref 100–199)

## 2023-04-16 DIAGNOSIS — E785 Hyperlipidemia, unspecified: Secondary | ICD-10-CM | POA: Insufficient documentation

## 2023-04-16 DIAGNOSIS — R9431 Abnormal electrocardiogram [ECG] [EKG]: Secondary | ICD-10-CM | POA: Insufficient documentation

## 2023-04-16 NOTE — Progress Notes (Unsigned)
Cardiology Office Note:   Date:  04/17/2023  ID:  Gabrielle Terry, DOB 19-May-1961, MRN [id number] PCP: Harvest Forest, MD  [facility] Providers Cardiologist:  Rollene Rotunda, MD {  History of Present Illness:   Gabrielle Terry is a 62 y.o. female who presents for evaluation of elevated coronary calcium.  After presenting for evaluation of this I sent her for a treadmill test and she went into stage IV reaching her target heart rate.  She had no symptoms.  She still exercises routinely including some HIT.  The patient denies any new symptoms such as chest discomfort, neck or arm discomfort. There has been no new shortness of breath, PND or orthopnea. There have been no reported palpitations, presyncope or syncope.      ROS: As stated in the HPI and negative for all other systems.  Studies Reviewed:    EKG:   NA  Risk Assessment/Calculations:              Physical Exam:   VS:  BP 112/72   Pulse 70   Ht 5\' 5"  (1.651 m)   Wt 160 lb (72.6 kg)   SpO2 97%   BMI 26.63 kg/m    Wt Readings from Last 3 Encounters:  04/17/23 160 lb (72.6 kg)  10/27/22 170 lb 6.4 oz (77.3 kg)  07/04/22 163 lb 8 oz (74.2 kg)     GEN: Well nourished, well developed in no acute distress NECK: No JVD; No carotid bruits CARDIAC: RRR, no murmurs, rubs, gallops RESPIRATORY:  Clear to auscultation without rales, wheezing or rhonchi  ABDOMEN: Soft, non-tender, non-distended EXTREMITIES:  No edema; No deformity   ASSESSMENT AND PLAN:   Abnormal CT:   The patient had a calcium score 382 which was 97 percentile but the negative adequate treadmill test and no symptoms.  We are pursuing aggressive primary risk reduction.  Ascending aortic dilatation:   I will follow this up in 1 year.  Dyslipidemia: The LDL was now down to 52 from 81.  LP(a) was normal.  HDL is 43.  No change in therapy.   Abnormal EKG: She had negative treadmill as above and normal echocardiogram.  No further  workup.         Follow up with me in one year.   Signed, Rollene Rotunda, MD

## 2023-04-17 ENCOUNTER — Ambulatory Visit: Attending: Cardiology | Admitting: Cardiology

## 2023-04-17 ENCOUNTER — Encounter: Payer: Self-pay | Admitting: Cardiology

## 2023-04-17 VITALS — BP 112/72 | HR 70 | Ht 65.0 in | Wt 160.0 lb

## 2023-04-17 DIAGNOSIS — E785 Hyperlipidemia, unspecified: Secondary | ICD-10-CM

## 2023-04-17 DIAGNOSIS — I77819 Aortic ectasia, unspecified site: Secondary | ICD-10-CM

## 2023-04-17 DIAGNOSIS — R9431 Abnormal electrocardiogram [ECG] [EKG]: Secondary | ICD-10-CM | POA: Diagnosis not present

## 2023-04-17 NOTE — Patient Instructions (Signed)
Medication Instructions:  No changes  *If you need a refill on your cardiac medications before your next appointment, please call your pharmacy*   Lab Work: Not needed    Testing/Procedures:  Not needed  Follow-Up: At [facility], you and your health needs are our priority.  As part of our continuing mission to provide you with exceptional heart care, we have created designated Provider Care Teams.  These Care Teams include your primary Cardiologist (physician) and Advanced Practice Providers (APPs -  Physician Assistants and Nurse Practitioners) who all work together to provide you with the care you need, when you need it.     Your next appointment:   12 month(s)  The format for your next appointment:   In Person  Provider:   Rollene Rotunda, MD

## 2023-04-19 LAB — LAB REPORT - SCANNED: EGFR: 85

## 2023-07-02 ENCOUNTER — Ambulatory Visit
Admission: RE | Admit: 2023-07-02 | Discharge: 2023-07-02 | Disposition: A | Discharge status: Home / Self Care | Payer: No Typology Code available for payment source | Source: Ambulatory Visit | Attending: Family Medicine | Admitting: Family Medicine

## 2023-07-02 ENCOUNTER — Other Ambulatory Visit: Payer: Self-pay | Admitting: Family Medicine

## 2023-07-02 DIAGNOSIS — M546 Pain in thoracic spine: Secondary | ICD-10-CM

## 2023-07-20 ENCOUNTER — Ambulatory Visit: Payer: No Typology Code available for payment source | Admitting: Dermatology

## 2023-07-20 ENCOUNTER — Encounter: Payer: Self-pay | Admitting: Dermatology

## 2023-07-20 VITALS — BP 126/76 | HR 72

## 2023-07-20 DIAGNOSIS — L821 Other seborrheic keratosis: Secondary | ICD-10-CM | POA: Diagnosis not present

## 2023-07-20 NOTE — Patient Instructions (Addendum)
Cerave (with salicylic acid)  SA helps flatten  Important Information   Due to recent changes in healthcare laws, you may see results of your pathology and/or laboratory studies on MyChart before the doctors have had a chance to review them. We understand that in some cases there may be results that are confusing or concerning to you. Please understand that not all results are received at the same time and often the doctors may need to interpret multiple results in order to provide you with the best plan of care or course of treatment. Therefore, we ask that you please give [place] 2 business days to thoroughly review all your results before contacting the office for clarification. Should we see a critical lab result, you will be contacted sooner.     If You Need Anything After Your Visit   If you have any questions or concerns for your doctor, please call our main line at [phone]. If no one answers, please leave a voicemail as directed and we will return your call as soon as possible. Messages left after 4 pm will be answered the following business day.    You may also send [place] a message via MyChart. We typically respond to MyChart messages within 1-2 business days.  For prescription refills, please ask your pharmacy to contact our office. Our fax number is [phone].  If you have an urgent issue when the clinic is closed that cannot wait until the next business day, you can page your doctor at the number below.     Please note that while we do our best to be available for urgent issues outside of office hours, we are not available 24/7.    If you have an urgent issue and are unable to reach [place], you may choose to seek medical care at your doctor's office, retail clinic, urgent care center, or emergency room.   If you have a medical emergency, please immediately call 911 or go to the emergency department. In the event of inclement weather, please call our main line at [phone] for an  update on the status of any delays or closures.  Dermatology Medication Tips: Please keep the boxes that topical medications come in in order to help keep track of the instructions about where and how to use these. Pharmacies typically print the medication instructions only on the boxes and not directly on the medication tubes.   If your medication is too expensive, please contact our office at [phone] or send [place] a message through MyChart.    We are unable to tell what your co-pay for medications will be in advance as this is different depending on your insurance coverage. However, we may be able to find a substitute medication at lower cost or fill out paperwork to get insurance to cover a needed medication.    If a prior authorization is required to get your medication covered by your insurance company, please allow [place] 1-2 business days to complete this process.   Drug prices often vary depending on where the prescription is filled and some pharmacies may offer cheaper prices.   The website www.goodrx.com contains coupons for medications through different pharmacies. The prices here do not account for what the cost may be with help from insurance (it may be cheaper with your insurance), but the website can give you the price if you did not use any insurance.  - You can print the associated coupon and take it with your prescription to the pharmacy.  -  You may also stop by our office during regular business hours and pick up a GoodRx coupon card.  - If you need your prescription sent electronically to a different pharmacy, notify our office through [facility] or by phone at [phone]

## 2023-07-20 NOTE — Progress Notes (Signed)
   New Terry Visit   Subjective  Gabrielle Terry is a 62 y.o. female who presents for the following: growth on abdomen. Pt has a growth on abdomen she'd like evaluated that has been present a few months. She has no hx of skin cancer. She has not treated the lesions previously. She has several in her suprapubic region and under her breasts as well.   The following portions of the chart were reviewed this encounter and updated as appropriate: medications, allergies, medical history  Review of Systems:  No other skin or systemic complaints except as noted in HPI or Assessment and Plan.  Objective  Well appearing Terry in no apparent distress; mood and affect are within normal limits.  A focused examination was performed of the following areas: Abdomen Under breasts Suprapubic region  Relevant exam findings are noted in the Assessment and Plan.   Assessment & Plan   SEBORRHEIC KERATOSIS abdomen, suprapubic region and under breasts. - Stuck-on, waxy, tan-brown papules and/or plaques  - Benign-appearing - Discussed benign etiology and prognosis. - Observe - Call for any changes - Discussed options for creams with Salicylic acid, including Cerave SA for smoothing agen  No follow-ups on file.  I, Tillie Fantasia, CMA, am acting as scribe for Gwenith Daily, MD.   Documentation: I have reviewed the above documentation for accuracy and completeness, and I agree with the above.  Gwenith Daily, MD

## 2023-08-06 ENCOUNTER — Other Ambulatory Visit: Payer: Self-pay | Admitting: Internal Medicine

## 2023-08-06 DIAGNOSIS — R748 Abnormal levels of other serum enzymes: Secondary | ICD-10-CM

## 2023-08-15 NOTE — Progress Notes (Unsigned)
  Cardiology Office Note:   Date:  08/16/2023  [name], DOB 01-21-1961, MRN [id number] PCP: Harvest Forest, MD  [facility] Providers Cardiologist:  Rollene Rotunda, MD {   History of Present Illness:   Gabrielle Terry is a 63 y.o. female who presents for evaluation of elevated coronary calcium.  After presenting for evaluation of this I sent her for a treadmill test and she went into stage IV reaching her target heart rate.  She had a negative POET (Plain Old Exercise Treadmill).  She does have a mildly dilated ascending aorta.    She has done well since I last saw her.  She continues to exercise still doing high intensity training.  The patient denies any new symptoms such as chest discomfort, neck or arm discomfort. There has been no new shortness of breath, PND or orthopnea. There have been no reported palpitations, presyncope or syncope.   ROS: As stated in the HPI and negative for all other systems.  Studies Reviewed:    EKG:   EKG Interpretation Date/Time:  Thursday August 16 2023 09:07:50 EST Ventricular Rate:  61 PR Interval:  188 QRS Duration:  100 QT Interval:  428 QTC Calculation: 430 R Axis:   -57  Text Interpretation: Normal sinus rhythm Left anterior fasicular block Poor anterior R wave progression When compared with ECG of 28-Jan-2020 12:14, T wave inversion no longer evident in Inferior leads QT has shortened Confirmed by Rollene Rotunda ([id number]) on 08/16/2023 9:28:30 AM    Risk Assessment/Calculations:              Physical Exam:   VS:  BP 116/78   Pulse 61   Ht 5\' 5"  (1.651 m)   Wt 154 lb (69.9 kg)   SpO2 97%   BMI 25.63 kg/m    Wt Readings from Last 3 Encounters:  08/16/23 154 lb (69.9 kg)  04/17/23 160 lb (72.6 kg)  10/27/22 170 lb 6.4 oz (77.3 kg)     GEN: Well nourished, well developed in no acute distress NECK: No JVD; No carotid bruits CARDIAC: RRR, no murmurs, rubs, gallops RESPIRATORY:  Clear to auscultation without rales,  wheezing or rhonchi  ABDOMEN: Soft, non-tender, non-distended EXTREMITIES:  No edema; No deformity   ASSESSMENT AND PLAN:   Elevated coronary calcium:   She had a negative POET (Plain Old Exercise Treadmill).  She has no new symptoms.  We are going to continue with aggressive risk reduction.   Ascending aortic dilatation:    She is to have follow up of this in May 2025 with a CT.     Dyslipidemia: The LDL was at target but she has had elevated liver enzymes.  She is now off of her statins and I am going to list this as an intolerance.  I would like her to be started on Repatha.        Follow up with me in one year.   Signed, Rollene Rotunda, MD

## 2023-08-16 ENCOUNTER — Telehealth: Payer: Self-pay

## 2023-08-16 ENCOUNTER — Telehealth: Payer: Self-pay | Admitting: Pharmacy Technician

## 2023-08-16 ENCOUNTER — Other Ambulatory Visit (HOSPITAL_COMMUNITY): Payer: Self-pay

## 2023-08-16 ENCOUNTER — Encounter: Payer: Self-pay | Admitting: Cardiology

## 2023-08-16 ENCOUNTER — Ambulatory Visit: Attending: Cardiology | Admitting: Cardiology

## 2023-08-16 ENCOUNTER — Telehealth: Payer: Self-pay | Admitting: Pharmacist

## 2023-08-16 VITALS — BP 116/78 | HR 61 | Ht 65.0 in | Wt 154.0 lb

## 2023-08-16 DIAGNOSIS — I77810 Thoracic aortic ectasia: Secondary | ICD-10-CM

## 2023-08-16 DIAGNOSIS — E785 Hyperlipidemia, unspecified: Secondary | ICD-10-CM

## 2023-08-16 DIAGNOSIS — R931 Abnormal findings on diagnostic imaging of heart and coronary circulation: Secondary | ICD-10-CM | POA: Diagnosis not present

## 2023-08-16 DIAGNOSIS — E7800 Pure hypercholesterolemia, unspecified: Secondary | ICD-10-CM

## 2023-08-16 MED ORDER — METOPROLOL TARTRATE 50 MG PO TABS: 50.0000 mg | ORAL_TABLET | Freq: Once | ORAL | 0 refills | Status: DC

## 2023-08-16 NOTE — Telephone Encounter (Signed)
Pharmacy Patient Advocate Encounter   Received notification from Pt Calls Messages that prior authorization for repatha  is required/requested.   Insurance verification completed.   The patient is insured through [employer] .   Per test claim: The current 28 day co-pay is, $0.00.  No PA needed at this time. This test claim was processed through [facility]- copay amounts may vary at other pharmacies due to pharmacy/plan contracts, or as the patient moves through the different stages of their insurance plan.

## 2023-08-16 NOTE — Patient Instructions (Addendum)
Medication Instructions:  Statins added to allergy list. Crestor removed from med list. *If you need a refill on your cardiac medications before your next appointment, please call your pharmacy*   Lab Work: FASTING Lipid profile due in 3 months. If you have labs (blood work) drawn today and your tests are completely normal, you will receive your results only by: MyChart Message (if you have MyChart) OR A paper copy in the mail If you have any lab test that is abnormal or we need to change your treatment, we will call you to review the results.  Non-Cardiac CT Angiography (CTA), is a special type of CT scan that uses a computer to produce multi-dimensional views of major blood vessels throughout the body. In CT angiography, a contrast material is injected through an IV to help visualize the blood vessels   Your next appointment:   12 month(s)  Provider:   Rollene Rotunda, MD     Other Instructions You will receive a phone call from the pharmacy dept regarding Repatha medication and consult.

## 2023-08-16 NOTE — Addendum Note (Signed)
Addended by: Jeannette How A on: 08/16/2023 10:30 AM   Modules accepted: Orders

## 2023-08-16 NOTE — Telephone Encounter (Signed)
Please complete PA for Repatha 

## 2023-08-22 MED ORDER — REPATHA SURECLICK 140 MG/ML ~~LOC~~ SOAJ: 1.0000 mL | SUBCUTANEOUS | 1 refills | Status: DC

## 2023-08-22 NOTE — Telephone Encounter (Signed)
Contacted patient regarding Repatha. No answer, lmom. Rx and lab orders placed.

## 2023-08-22 NOTE — Telephone Encounter (Signed)
Patient called back. Advised repatha has been sent to pharmacy

## 2023-08-23 ENCOUNTER — Telehealth: Payer: Self-pay

## 2023-08-23 NOTE — Telephone Encounter (Signed)
Called and spoke with patient to confirm she is not to take the one time dose of metoprolol as previously directed in the last office appointment. She voiced understanding. Her corrected test order for a CT of aorta is scheduled for 08/31/23 at 2pm.

## 2023-08-23 NOTE — Telephone Encounter (Signed)
Called and spoke with patient. Informed her that I had corrected the test order that was placed at her last appointment and for this test she did NOT need to take the one time dose of metoprolol as previously advised. She states she understands not to take this medication. CT of aorta is scheduled for 08/31/23 at 2pm.

## 2023-08-24 ENCOUNTER — Ambulatory Visit
Admission: RE | Admit: 2023-08-24 | Discharge: 2023-08-24 | Disposition: A | Discharge status: Home / Self Care | Payer: No Typology Code available for payment source | Source: Ambulatory Visit | Attending: Internal Medicine | Admitting: Internal Medicine

## 2023-08-24 ENCOUNTER — Other Ambulatory Visit

## 2023-08-24 DIAGNOSIS — R748 Abnormal levels of other serum enzymes: Secondary | ICD-10-CM

## 2023-08-27 ENCOUNTER — Other Ambulatory Visit: Payer: Self-pay | Admitting: Allergy and Immunology

## 2023-08-27 DIAGNOSIS — J45909 Unspecified asthma, uncomplicated: Secondary | ICD-10-CM

## 2023-08-31 ENCOUNTER — Ambulatory Visit (HOSPITAL_COMMUNITY)
Admission: RE | Admit: 2023-08-31 | Discharge: 2023-08-31 | Disposition: A | Discharge status: Home / Self Care | Payer: No Typology Code available for payment source | Source: Ambulatory Visit | Attending: Cardiology | Admitting: Cardiology

## 2023-08-31 DIAGNOSIS — I77810 Thoracic aortic ectasia: Secondary | ICD-10-CM | POA: Insufficient documentation

## 2023-08-31 MED ORDER — IOHEXOL 350 MG/ML SOLN
75.0000 mL | Freq: Once | INTRAVENOUS | Status: AC | PRN
  Administered 2023-08-31: 75 mL via INTRAVENOUS

## 2023-10-01 ENCOUNTER — Ambulatory Visit: Admitting: Pharmacist Clinician (PhC)/ Clinical Pharmacy Specialist

## 2023-10-01 NOTE — Progress Notes (Unsigned)
 This encounter was created in error - please disregard.

## 2023-11-10 LAB — LIPID PANEL
Chol/HDL Ratio: 2.4 ratio (ref 0.0–4.4)
Cholesterol, Total: 131 mg/dL (ref 100–199)
HDL: 54 mg/dL (ref >39)
LDL Chol Calc (NIH): 68 mg/dL (ref 0–99)
Triglycerides: 34 mg/dL (ref 0–149)
VLDL Cholesterol Cal: 9 mg/dL (ref 5–40)

## 2023-11-12 ENCOUNTER — Encounter: Payer: Self-pay | Admitting: *Deleted

## 2023-12-21 ENCOUNTER — Other Ambulatory Visit: Payer: Self-pay | Admitting: Cardiology

## 2023-12-21 DIAGNOSIS — R931 Abnormal findings on diagnostic imaging of heart and coronary circulation: Secondary | ICD-10-CM

## 2023-12-21 DIAGNOSIS — E7800 Pure hypercholesterolemia, unspecified: Secondary | ICD-10-CM

## 2023-12-21 DIAGNOSIS — E785 Hyperlipidemia, unspecified: Secondary | ICD-10-CM

## 2023-12-26 ENCOUNTER — Encounter: Payer: Self-pay | Admitting: Cardiology

## 2023-12-26 MED ORDER — EZETIMIBE 10 MG PO TABS: 10.0000 mg | ORAL_TABLET | Freq: Every day | ORAL | 3 refills | Status: AC

## 2024-02-15 ENCOUNTER — Telehealth: Payer: Self-pay | Admitting: Allergy and Immunology

## 2024-02-15 NOTE — Telephone Encounter (Signed)
 Called to schedule Fasenra  reapproval appointment. Voicemail is full, could not leave message.

## 2024-03-11 ENCOUNTER — Encounter: Payer: Self-pay | Admitting: Dermatology

## 2024-03-11 ENCOUNTER — Ambulatory Visit (INDEPENDENT_AMBULATORY_CARE_PROVIDER_SITE_OTHER): Admitting: Dermatology

## 2024-03-11 VITALS — BP 107/81

## 2024-03-11 DIAGNOSIS — Z13228 Encounter for screening for other metabolic disorders: Secondary | ICD-10-CM | POA: Insufficient documentation

## 2024-03-11 DIAGNOSIS — L720 Epidermal cyst: Secondary | ICD-10-CM

## 2024-03-11 DIAGNOSIS — M545 Low back pain, unspecified: Secondary | ICD-10-CM | POA: Insufficient documentation

## 2024-03-11 DIAGNOSIS — Q438 Other specified congenital malformations of intestine: Secondary | ICD-10-CM | POA: Insufficient documentation

## 2024-03-11 DIAGNOSIS — Z6825 Body mass index (BMI) 25.0-25.9, adult: Secondary | ICD-10-CM | POA: Insufficient documentation

## 2024-03-11 DIAGNOSIS — K5909 Other constipation: Secondary | ICD-10-CM | POA: Insufficient documentation

## 2024-03-11 DIAGNOSIS — E663 Overweight: Secondary | ICD-10-CM | POA: Insufficient documentation

## 2024-03-11 DIAGNOSIS — R7303 Prediabetes: Secondary | ICD-10-CM | POA: Insufficient documentation

## 2024-03-11 DIAGNOSIS — K5903 Drug induced constipation: Secondary | ICD-10-CM | POA: Insufficient documentation

## 2024-03-11 DIAGNOSIS — F419 Anxiety disorder, unspecified: Secondary | ICD-10-CM | POA: Insufficient documentation

## 2024-03-11 DIAGNOSIS — K5901 Slow transit constipation: Secondary | ICD-10-CM | POA: Insufficient documentation

## 2024-03-11 DIAGNOSIS — Z8639 Personal history of other endocrine, nutritional and metabolic disease: Secondary | ICD-10-CM | POA: Insufficient documentation

## 2024-03-11 DIAGNOSIS — G8929 Other chronic pain: Secondary | ICD-10-CM | POA: Insufficient documentation

## 2024-03-11 NOTE — Patient Instructions (Addendum)
 Date: Tue Mar 11 2024  Hello Gabrielle Terry,  Thank you for visiting today. Here is a summary of the key instructions:  - Medications:   - Resume using tretinoin  cream   - Start with 2 nights a week (e.g., Monday and Thursday)   - Use a pea-sized amount for the whole face   - Avoid eyes, lips, and neck   - Wash off in the morning   - If no irritation, increase to 3 nights a week after a month  - Skin Care:   - Use gel or foam cleanser to wash face   - Apply moisturizer after tretinoin  (samples provided)   - Use sunscreen daily on face, neck, and chest  - Follow-up:   - Return in the spring for tretinoin  strength adjustment  - Other Instructions:   - Check expiration date on tretinoin  tube   - Call the office for a new prescription if tretinoin  is expired   - Consider professional milia removal (not covered by insurance)  Please reach out if you have any questions or concerns.  Warm regards,  Dr. Delon Lenis [facility]   Other Procedure(Milia): Quote for cosmetic removal:  $200 to remove Up to 15 lesions $300 to remove  16 to 25 $400 to removal  26-35 $10 per tag for each additional    Important Information  Due to recent changes in healthcare laws, you may see results of your pathology and/or laboratory studies on MyChart before the doctors have had a chance to review them. We understand that in some cases there may be results that are confusing or concerning to you. Please understand that not all results are received at the same time and often the doctors may need to interpret multiple results in order to provide you with the best plan of care or course of treatment. Therefore, we ask that you please give us  2 business days to thoroughly review all your results before contacting the office for clarification. Should we see a critical lab result, you will be contacted sooner.   If You Need Anything After Your Visit  If you have any questions or concerns for your doctor,  please call our main line at [phone] If no one answers, please leave a voicemail as directed and we will return your call as soon as possible. Messages left after 4 pm will be answered the following business day.   You may also send us  a message via MyChart. We typically respond to MyChart messages within 1-2 business days.  For prescription refills, please ask your pharmacy to contact our office. Our fax number is [phone].  If you have an urgent issue when the clinic is closed that cannot wait until the next business day, you can page your doctor at the number below.    Please note that while we do our best to be available for urgent issues outside of office hours, we are not available 24/7.   If you have an urgent issue and are unable to reach us , you may choose to seek medical care at your doctor's office, retail clinic, urgent care center, or emergency room.  If you have a medical emergency, please immediately call 911 or go to the emergency department. In the event of inclement weather, please call our main line at [phone] for an update on the status of any delays or closures.  Dermatology Medication Tips: Please keep the boxes that topical medications come in in order to help keep track of the instructions  about where and how to use these. Pharmacies typically print the medication instructions only on the boxes and not directly on the medication tubes.   If your medication is too expensive, please contact our office at [phone] or send us  a message through MyChart.   We are unable to tell what your co-pay for medications will be in advance as this is different depending on your insurance coverage. However, we may be able to find a substitute medication at lower cost or fill out paperwork to get insurance to cover a needed medication.   If a prior authorization is required to get your medication covered by your insurance company, please allow us  1-2 business days to  complete this process.  Drug prices often vary depending on where the prescription is filled and some pharmacies may offer cheaper prices.  The website www.goodrx.com contains coupons for medications through different pharmacies. The prices here do not account for what the cost may be with help from insurance (it may be cheaper with your insurance), but the website can give you the price if you did not use any insurance.  - You can print the associated coupon and take it with your prescription to the pharmacy.  - You may also stop by our office during regular business hours and pick up a GoodRx coupon card.  - If you need your prescription sent electronically to a different pharmacy, notify our office through [facility] or by phone at [phone]

## 2024-03-11 NOTE — Progress Notes (Signed)
   New Patient Visit   Subjective  Gabrielle Terry is a 63 y.o. female who presents for a NEW PATIENT appointment to be examined for the concerns as listed below.   Rough Skin: Patient would like to discuss a regimen for rough facial skin as she is looking for smooth and glowing skin. She is currently using Ronal Murray products for cleanser, moisturizer, and an anti aging cream.    Are you nursing, pregnant or trying to conceive? No   Patient denied Hx of Bx. Patient denied family Hx of skin cancer.    The following portions of the chart were reviewed this encounter and updated as appropriate: medications, allergies, medical history  Review of Systems:  No other skin or systemic complaints except as noted in HPI or Assessment and Plan.  Objective  Well appearing patient in no apparent distress; mood and affect are within normal limits.   A focused examination was performed of the following areas: face   Relevant exam findings are noted in the Assessment and Plan.          Assessment & Plan   1. 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 These are tiny cysts that have been present for an extended period. The patient was previously prescribed tretinoin  but discontinued use due to perceived ineffectiveness. Tretinoin  is assessed as an appropriate treatment to prevent new milia formation, stimulate collagen production, and provide anti-aging benefits. - Plan:    Resume tretinoin  treatment starting with application 2 nights per week, increasing to 3 nights per week after 1 month if tolerated    Apply pea-sized amount to whole face, avoiding eyes, lips, and neck    Wash off in the morning and adjust usage based on seasonal changes    Apply sunscreen daily to face, neck, and chest    Use gel or foam cleanser for face washing    Apply moisturizer after tretinoin     Optional milia extraction procedure available  starting at $200 for up to 15 extractions  Follow-up in spring to assess tretinoin  strength adjustment.   No follow-ups on file.   Documentation: I have reviewed the above documentation for accuracy and completeness, and I agree with the above.  I, Shirron Maranda, CMA, am acting as scribe for [employer], DO.   Delon Lenis, DO

## 2024-03-12 ENCOUNTER — Other Ambulatory Visit: Payer: Self-pay

## 2024-03-12 MED ORDER — TRETINOIN 0.025 % EX CREA: TOPICAL_CREAM | CUTANEOUS | 2 refills | Status: DC

## 2024-03-12 NOTE — Progress Notes (Signed)
 Pt called LVM stating that the Tretinoin  that she had on hand was expired and would need a new Rx.  Rx sent to [facility]. Pt informed.

## 2024-05-07 ENCOUNTER — Telehealth: Payer: Self-pay

## 2024-05-07 ENCOUNTER — Telehealth (HOSPITAL_BASED_OUTPATIENT_CLINIC_OR_DEPARTMENT_OTHER): Payer: Self-pay | Admitting: *Deleted

## 2024-05-07 DIAGNOSIS — I10 Essential (primary) hypertension: Secondary | ICD-10-CM

## 2024-05-07 NOTE — Telephone Encounter (Signed)
   Name: Gabrielle Terry  DOB: 01-03-1961  MRN: [id number]  Primary Cardiologist: Lynwood Schilling, MD   Preoperative team, please contact this patient and set up a phone call appointment for further preoperative risk assessment. Please obtain consent and complete medication review. Thank you for your help.  I confirm that guidance regarding antiplatelet and oral anticoagulation therapy has been completed and, if necessary, noted below.   Per office protocol, if patient is without any new symptoms or concerns at the time of their virtual visit, she may hold ASA for 7 days prior to procedure. Please resume ASA as soon as possible postprocedure, at the discretion of the surgeon.    I also confirmed the patient resides in the state of  . As per [facility] Medical Board telemedicine laws, the patient must reside in the state in which the provider is licensed.   Lamarr Satterfield, NP 05/07/2024, 2:30 PM [facility]

## 2024-05-07 NOTE — Telephone Encounter (Signed)
 Patient was returning call. Please advise ?

## 2024-05-07 NOTE — Telephone Encounter (Signed)
 Pt has been scheduled tele preop appt 05/13/24. Pt states surgeon office waiting on clearance before scheduling procedure. Med rec and consent are done.      Patient Consent for Virtual Visit        Gabrielle Terry has provided verbal consent on 05/07/2024 for a virtual visit (video or telephone).   CONSENT FOR VIRTUAL VISIT FOR:  Gabrielle Terry  By participating in this virtual visit I agree to the following:  I hereby voluntarily request, consent and authorize [facility] and its employed or contracted physicians, physician assistants, nurse practitioners or other licensed health care professionals (the Practitioner), to provide me with telemedicine health care services (the "Services) as deemed necessary by the treating Practitioner. I acknowledge and consent to receive the Services by the Practitioner via telemedicine. I understand that the telemedicine visit will involve communicating with the Practitioner through live audiovisual communication technology and the disclosure of certain medical information by electronic transmission. I acknowledge that I have been given the opportunity to request an in-person assessment or other available alternative prior to the telemedicine visit and am voluntarily participating in the telemedicine visit.  I understand that I have the right to withhold or withdraw my consent to the use of telemedicine in the course of my care at any time, without affecting my right to future care or treatment, and that the Practitioner or I may terminate the telemedicine visit at any time. I understand that I have the right to inspect all information obtained and/or recorded in the course of the telemedicine visit and may receive copies of available information for a reasonable fee.  I understand that some of the potential risks of receiving the Services via telemedicine include:  Delay or interruption in medical evaluation due to technological equipment failure or  disruption; Information transmitted may not be sufficient (e.g. poor resolution of images) to allow for appropriate medical decision making by the Practitioner; and/or  In rare instances, security protocols could fail, causing a breach of personal health information.  Furthermore, I acknowledge that it is my responsibility to provide information about my medical history, conditions and care that is complete and accurate to the best of my ability. I acknowledge that Practitioner's advice, recommendations, and/or decision may be based on factors not within their control, such as incomplete or inaccurate data provided by me or distortions of diagnostic images or specimens that may result from electronic transmissions. I understand that the practice of medicine is not an exact science and that Practitioner makes no warranties or guarantees regarding treatment outcomes. I acknowledge that a copy of this consent can be made available to me via my patient portal [facility] MyChart), or I can request a printed copy by calling the office of [facility].    I understand that my insurance will be billed for this visit.   I have read or had this consent read to me. I understand the contents of this consent, which adequately explains the benefits and risks of the Services being provided via telemedicine.  I have been provided ample opportunity to ask questions regarding this consent and the Services and have had my questions answered to my satisfaction. I give my informed consent for the services to be provided through the use of telemedicine in my medical care

## 2024-05-07 NOTE — Telephone Encounter (Signed)
   Pre-operative Risk Assessment    Patient Name: Gabrielle Terry  DOB: [birth year] MRN: [id number]   Date of last office visit: 08/16/23  Dr. Lavona Date of next office visit: NA   Request for Surgical Clearance    Procedure:  Colonoscopy  Date of Surgery:  Clearance TBD                               Surgeon:  Not Indicated Surgeon's Group or Practice Name:  [facility] Phone number:  [phone] Fax number:  [phone]   Type of Clearance Requested:   - Medical    Type of Anesthesia:  MAC   Additional requests/questions:    [name]   05/07/2024, 1:51 PM

## 2024-05-07 NOTE — Telephone Encounter (Signed)
 Left message to call back to schedule tele pre op appt.

## 2024-05-07 NOTE — Telephone Encounter (Signed)
 Pt has been scheduled tele preop appt 05/13/24. Pt states surgeon office waiting on clearance before scheduling procedure. Med rec and consent are done.

## 2024-05-13 ENCOUNTER — Ambulatory Visit: Attending: Internal Medicine

## 2024-05-13 DIAGNOSIS — Z01810 Encounter for preprocedural cardiovascular examination: Secondary | ICD-10-CM

## 2024-05-13 NOTE — Telephone Encounter (Signed)
 Pt has been scheduled to see Katlyn West, NP 05/21/24 @ 8:50. Pt has been advised if her symptoms worsen she is to go to the ED. Pt verbalized understanding to plan of care.

## 2024-05-13 NOTE — Progress Notes (Signed)
   Name:  Gabrielle Terry  DOB:  [birth year]  MRN:  [id number]   Primary Cardiologist: Lynwood Schilling, MD  Chart reviewed as part of pre-operative protocol coverage. Patient was contacted 05/13/2024 in reference to pre-operative risk assessment for pending surgery as outlined below.  KARYNN DEBLASI was last seen on 08/16/23 by Dr. Schilling.  Patient was contacted today for preoperative cardiac evaluation for an upcoming colonoscopy.  Today she reports episodes of a chest discomfort in the middle of her chest that have been coming on at random happening a few times a week for the last few months. She reports this is not necessarily a pain and is similar to a pressure, improves with deep breathing and relaxing however notes it is not typically associated with exertion.  She denies any increased shortness of breath, lower extremity edema, orthopnea or PND.  She denies any palpitations, presyncope or syncope.  Due to new or worsening symptoms, ESTHER BRADSTREET will require a follow-up visit for further pre-operative risk assessment.  Will no charge for televisit today.  Pre-op covering staff: - Please schedule appointment and call patient to inform them. If patient already had an upcoming appointment within acceptable timeframe, please add pre-op clearance to the appointment notes so provider is aware. - Please contact requesting surgeon's office via preferred method (i.e, phone, fax) to inform them of need for appointment prior to surgery.  Helaine Yackel D Raenah Murley, NP 05/13/2024, 2:30 PM

## 2024-05-20 NOTE — Progress Notes (Unsigned)
 Cardiology Office Note    Date:  05/21/2024  ID:  Gabrielle Terry, Gabrielle Terry 1961-07-23, MRN [id number] PCP:  Roanna Ezekiel NOVAK, MD  Cardiologist:  Lynwood Schilling, MD  Electrophysiologist:  None   Chief Complaint: Precordial pain, preoperative cardiac evaluation   History of Present Illness: .   Gabrielle Terry is a 63 y.o. female with visit-pertinent history of elevated coronary calcium  scoring, hypertension, hyperlipidemia and obesity.  Patient previously established care with Dr. Schilling in 10/2022 for evaluation of elevated coronary calcium .  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  Following that she underwent exercise treadmill test which was negative.  Today she presents for regarding episodes of chest discomfort reported at a preoperative cardiac evaluation.  She reports that she continues to note chest discomfort typically when driving in the left chest that lasts a few minutes.  She reports this typically relives with deep breathing and deep breathing. She denies shortness of breath, palpitations, presyncope or syncope, lower extremity edema, orthopnea or pnd.  Patient reports that in the last few months she has started noticing this sensation of chest discomfort that has become slightly more frequent, occurring multiple times a month, not specifically associated with exertion.  Patient will need to undergo testing prior to colonoscopy for clearance. ROS: .   Today she denies shortness of breath, lower extremity edema, fatigue, palpitations, melena, hematuria, hemoptysis, diaphoresis, weakness, presyncope, syncope, orthopnea, and PND.  All other systems are reviewed and otherwise negative. Studies Reviewed: SABRA   EKG:  EKG is ordered  today, personally reviewed, demonstrating  EKG Interpretation Date/Time:  Wednesday May 21 2024 08:33:13 EDT Ventricular Rate:  64 PR Interval:  180 QRS Duration:  98 QT Interval:  414 QTC Calculation: 427 R Axis:   -55  Text Interpretation: Normal sinus rhythm Low voltage QRS Left anterior fascicular block Poor R wave progression When compared with ECG of 16-Aug-2023 09:07, No significant change was found Confirmed by Fareeha Evon [id number]) on 05/21/2024 9:06:14 AM   CV Studies: Cardiac studies reviewed are outlined and summarized above. Otherwise please see EMR for full report. Cardiac Studies & Procedures   ______________________________________________________________________________________________   STRESS TESTS  EXERCISE TOLERANCE TEST (ETT) 12/07/2022  Interpretation Summary   No ST deviation was noted.  Normal ETT No arrhythmias HTN response to exercise Baseline ECG LAD/poor R wave progression  Maude Emmer MD [facility]   ECHOCARDIOGRAM  ECHOCARDIOGRAM COMPLETE 11/22/2022  Narrative ECHOCARDIOGRAM REPORT    Patient Name:   Gabrielle Terry Date of Exam: 11/22/2022 Medical Rec #:  [id number]            Height:       65.0 in Accession #:    [id number]           Weight:       170.4 lb Date of Birth:  12/21/1960           BSA:          1.848 m Patient Age:    61 years             BP:           130/90 mmHg Patient Gender: F  HR:           71 bpm. Exam Location:  [address]  Procedure: 2D Echo, Cardiac Doppler and Color Doppler  Indications:    R94.31 Abnormal EKG; E78.00 Hyperlipidemia  History:        Patient has no prior history of Echocardiogram examinations. Risk Factors:Hypertension. Abnormal CT scan. Asthma. Acquired dilation of ascending aorta and aortic root. Elevated coronary artery calcium  score.  Sonographer:    Jon Hacker RCS Referring Phys: [id number] [place]  IMPRESSIONS   1. Left ventricular ejection fraction, by  estimation, is 65 to 70%. The left ventricle has normal function. The left ventricle has no regional wall motion abnormalities. There is mild left ventricular hypertrophy. Left ventricular diastolic parameters were normal. 2. Right ventricular systolic function is normal. The right ventricular size is normal. There is normal pulmonary artery systolic pressure. The estimated right ventricular systolic pressure is 21.3 mmHg. 3. The mitral valve is normal in structure. Trivial mitral valve regurgitation. No evidence of mitral stenosis. 4. The aortic valve is tricuspid. Aortic valve regurgitation is not visualized. No aortic stenosis is present. 5. Aortic dilatation noted. There is dilatation of the ascending aorta, measuring 41 mm. 6. The inferior vena cava is normal in size with greater than 50% respiratory variability, suggesting right atrial pressure of 3 mmHg.  FINDINGS Left Ventricle: Left ventricular ejection fraction, by estimation, is 65 to 70%. The left ventricle has normal function. The left ventricle has no regional wall motion abnormalities. The left ventricular internal cavity size was small. There is mild left ventricular hypertrophy. Left ventricular diastolic parameters were normal.  Right Ventricle: The right ventricular size is normal. No increase in right ventricular wall thickness. Right ventricular systolic function is normal. There is normal pulmonary artery systolic pressure. The tricuspid regurgitant velocity is 2.14 m/s, and with an assumed right atrial pressure of 3 mmHg, the estimated right ventricular systolic pressure is 21.3 mmHg.  Left Atrium: Left atrial size was normal in size.  Right Atrium: Right atrial size was normal in size.  Pericardium: There is no evidence of pericardial effusion.  Mitral Valve: The mitral valve is normal in structure. Trivial mitral valve regurgitation. No evidence of mitral valve stenosis.  Tricuspid Valve: The tricuspid valve is normal  in structure. Tricuspid valve regurgitation is trivial.  Aortic Valve: The aortic valve is tricuspid. Aortic valve regurgitation is not visualized. No aortic stenosis is present.  Pulmonic Valve: The pulmonic valve was grossly normal. Pulmonic valve regurgitation is trivial.  Aorta: The aortic root is normal in size and structure and aortic dilatation noted. There is dilatation of the ascending aorta, measuring 41 mm.  Venous: The inferior vena cava is normal in size with greater than 50% respiratory variability, suggesting right atrial pressure of 3 mmHg.  IAS/Shunts: The interatrial septum was not well visualized.   LEFT VENTRICLE PLAX 2D LVIDd:         3.40 cm   Diastology LVIDs:         1.80 cm   LV e' medial:    7.62 cm/s LV PW:         1.10 cm   LV E/e' medial:  11.4 LV IVS:        1.30 cm   LV e' lateral:   10.40 cm/s LVOT diam:     1.90 cm   LV E/e' lateral: 8.3 LV SV:         62 LV SV Index:   34 LVOT Area:  2.84 cm   RIGHT VENTRICLE RV Basal diam:  2.10 cm RV S prime:     13.30 cm/s TAPSE (M-mode): 2.4 cm RVSP:           21.3 mmHg  LEFT ATRIUM             Index        RIGHT ATRIUM           Index LA diam:        4.00 cm 2.16 cm/m   RA Pressure: 3.00 mmHg LA Vol (A2C):   36.8 ml 19.91 ml/m  RA Area:     10.10 cm LA Vol (A4C):   28.0 ml 15.15 ml/m  RA Volume:   15.90 ml  8.60 ml/m LA Biplane Vol: 35.5 ml 19.21 ml/m AORTIC VALVE             PULMONIC VALVE LVOT Vmax:   105.00 cm/s PR End Diast Vel: 3.94 msec LVOT Vmean:  71.400 cm/s LVOT VTI:    0.220 m  AORTA Ao Root diam: 3.30 cm Ao Asc diam:  4.10 cm  MITRAL VALVE                TRICUSPID VALVE MV Area (PHT): 3.37 cm     TR Peak grad:   18.3 mmHg MV Decel Time: 225 msec     TR Vmax:        214.00 cm/s MV E velocity: 86.50 cm/s   Estimated RAP:  3.00 mmHg MV A velocity: 106.00 cm/s  RVSP:           21.3 mmHg MV E/A ratio:  0.82 SHUNTS Systemic VTI:  0.22 m Systemic Diam: 1.90 cm  Lonni Nanas MD Electronically signed by Lonni Nanas MD Signature Date/Time: 11/24/2022/11:48:13 AM    Final          ______________________________________________________________________________________________       Current Reported Medications:.    Current Meds  Medication Sig   Albuterol -Budesonide (AIRSUPRA ) 90-80 MCG/ACT AERO Inhale 2 Inhalations into the lungs every 6 (six) hours as needed.   EPINEPHrine  0.3 mg/0.3 mL IJ SOAJ injection Use as directed for life-threatening allergic reaction. (Patient taking differently: as needed. Use as directed for life-threatening allergic reaction.)   Evolocumab  (REPATHA  SURECLICK) 140 MG/ML SOAJ INJECT 140 MG INTO THE SKIN EVERY 14 (FOURTEEN) DAYS.   ezetimibe  (ZETIA ) 10 MG tablet Take 1 tablet (10 mg total) by mouth daily.   FASENRA  PEN 30 MG/ML prefilled autoinjector INJECT 1 PEN UNDER THE SKIN EVERY 8 WEEKS   linaclotide (LINZESS) 290 MCG CAPS capsule Take 290 mcg by mouth daily before breakfast.   losartan -hydrochlorothiazide (HYZAAR) 50-12.5 MG tablet Take 1 tablet by mouth daily.   polyethylene glycol (MIRALAX  / GLYCOLAX ) packet Take 17 g by mouth daily.   tretinoin  (RETIN-A ) 0.025 % cream Apply pea size amount to face 2 nights per week, increasing to 3 nights per week after 1 month if tolerated   WEGOVY  1.7 MG/0.75ML SOAJ INJECT 1.7 MG UNDER THE SKIN ONCE WEEKLY Subcutaneous   Current Facility-Administered Medications for the 05/21/24 encounter (Office Visit) with Stamatia Masri D, NP  Medication   Benralizumab  SOSY 30 mg   Physical Exam:    VS:  BP 108/69 (BP Location: Left Arm, Patient Position: Sitting, Cuff Size: Normal)   Pulse 63   Resp 16   Ht 5' 5 (1.651 m)   Wt 160 lb 12.8 oz (72.9 kg)   SpO2 100%   BMI 26.76 kg/m  Wt Readings from Last 3 Encounters:  05/21/24 160 lb 12.8 oz (72.9 kg)  08/16/23 154 lb (69.9 kg)  04/17/23 160 lb (72.6 kg)    GEN: Well nourished, well developed in no acute  distress NECK: No JVD; No carotid bruits CARDIAC: RRR, no murmurs, rubs, gallops RESPIRATORY:  Clear to auscultation without rales, wheezing or rhonchi  ABDOMEN: Soft, non-tender, non-distended EXTREMITIES:  No edema; No acute deformity     Asessement and Plan:.    Preoperative cardiac evaluation: Patient planning to undergo colonoscopy, she has been noting episodes of chest discomfort will need further evaluation prior to clearance.  Precordial pain: Patient notes episodes of left-sided chest discomfort, not specifically associated with exertion.  She notes this will occur while she is driving in her car, lasts a few minutes and is typically relieved with relaxing and deep breathing.  She denies any increased shortness of breath.  Patient previously underwent exercise treadmill test last year that was negative however does have history of an elevated coronary calcium  score.  She is also planning to undergo colonoscopy and will need clearance prior to procedure.  Will proceed with coronary CTA for further evaluation of precordial pain, reviewed indication for nitroglycerin during testing.  Reviewed ED precautions.  Check CBC and BMET today.   Hyperlipidemia: Last lipid profile on 11/09/2023 indicated total cholesterol 131, HDL 44, triglycerides 34 and LDL 68.  Continue Repatha  140 mg daily and Zetia  10 mg daily.  Hypertension: Blood pressure today at 108/69.  Continue losartan -hydrochlorothiazide 50-12.5 mg daily.  Obesity: Patient has been prescribed Wegovy  to assist with weight loss, has noted significant benefit in taking medication.  Encouraged to continue use of Wegovy  given cardiovascular benefit.     Disposition: F/u with Lasalle Abee, NP in 6-8 weeks.   Signed, Ulyses Panico D Jaquila Santelli, NP

## 2024-05-21 ENCOUNTER — Encounter: Payer: Self-pay | Admitting: Cardiology

## 2024-05-21 ENCOUNTER — Ambulatory Visit: Attending: Cardiology | Admitting: Cardiology

## 2024-05-21 VITALS — BP 108/69 | HR 63 | Resp 16 | Ht 65.0 in | Wt 160.8 lb

## 2024-05-21 DIAGNOSIS — I10 Essential (primary) hypertension: Secondary | ICD-10-CM | POA: Diagnosis not present

## 2024-05-21 DIAGNOSIS — E785 Hyperlipidemia, unspecified: Secondary | ICD-10-CM | POA: Diagnosis not present

## 2024-05-21 DIAGNOSIS — Z01810 Encounter for preprocedural cardiovascular examination: Secondary | ICD-10-CM | POA: Diagnosis not present

## 2024-05-21 DIAGNOSIS — R072 Precordial pain: Secondary | ICD-10-CM

## 2024-05-21 LAB — CBC

## 2024-05-21 NOTE — Patient Instructions (Addendum)
 Medication Instructions:  Your physician recommends that you continue on your current medications as directed. Please refer to the Current Medication list given to you today.  *If you need a refill on your cardiac medications before your next appointment, please call your pharmacy*  Lab Work: Today- CBC, BMET If you have labs (blood work) drawn today and your tests are completely normal, you will receive your results only by: MyChart Message (if you have MyChart) OR A paper copy in the mail If you have any lab test that is abnormal or we need to change your treatment, we will call you to review the results.  Testing/Procedures:   Your cardiac CT will be scheduled at one of the below locations:    [facility] [address]  [place], [place] [address]   If scheduled at the [facility] at [address], please enter the parking lot using the [address] entrance and use the FREE valet service at the patient drop-off area. Enter the building and check-in with registration on the main floor.  Please follow these instructions carefully (unless otherwise directed):  An IV will be required for this test and Nitroglycerin will be given.  Hold all erectile dysfunction medications at least 3 days (72 hrs) prior to test. (Ie viagra, cialis, sildenafil, tadalafil, etc)   On the Night Before the Test: Be sure to Drink plenty of water. Do not consume any caffeinated/decaffeinated beverages or chocolate 12 hours prior to your test. Do not take any antihistamines 12 hours prior to your test.  If the patient has contrast allergy: Patient will need a prescription for Prednisone  and very clear instructions (as follows): Prednisone  50 mg - take 13 hours prior to test Take another Prednisone  50 mg 7 hours prior to test Take another Prednisone  50 mg 1 hour prior to test Take Benadryl  50 mg 1 hour prior to test Patient must complete all four doses of  above prophylactic medications. Patient will need a ride after test due to Benadryl .  On the Day of the Test: Drink plenty of water until 1 hour prior to the test. Do not eat any food 1 hour prior to test. You may take your regular medications prior to the test.  Take metoprolol  (Lopressor ) two hours prior to test. If you take Furosemide/Hydrochlorothiazide/Spironolactone/Chlorthalidone, please HOLD on the morning of the test. Patients who wear a continuous glucose monitor MUST remove the device prior to scanning. FEMALES- please wear underwire-free bra if available, avoid dresses & tight clothing       After the Test: Drink plenty of water. After receiving IV contrast, you may experience a mild flushed feeling. This is normal. On occasion, you may experience a mild rash up to 24 hours after the test. This is not dangerous. If this occurs, you can take Benadryl  25 mg, Zyrtec, Claritin, or Allegra and increase your fluid intake. (Patients taking Tikosyn should avoid Benadryl , and may take Zyrtec, Claritin, or Allegra) If you experience trouble breathing, this can be serious. If it is severe call 911 IMMEDIATELY. If it is mild, please call our office.  We will call to schedule your test 2-4 weeks out understanding that some insurance companies will need an authorization prior to the service being performed.   For more information and frequently asked questions, please visit our website : [email or web address]  For non-scheduling related questions, please contact the cardiac imaging nurse navigator should you have any questions/concerns: Cardiac Imaging Nurse Navigators Direct Office  Dial: [phone]   For scheduling needs, including cancellations and rescheduling, please call Brittany, [phone].   Follow-Up: At [facility], you and your health needs are our priority.  As part of our continuing mission to provide you with exceptional heart care, our providers  are all part of one team.  This team includes your primary Cardiologist (physician) and Advanced Practice Providers or APPs (Physician Assistants and Nurse Practitioners) who all work together to provide you with the care you need, when you need it.  Your next appointment:   6-8 week(s)  Provider:   Katlyn West, NP

## 2024-05-22 ENCOUNTER — Ambulatory Visit: Payer: Self-pay | Admitting: Cardiology

## 2024-05-22 LAB — BASIC METABOLIC PANEL WITH GFR
BUN/Creatinine Ratio: 28 (ref 12–28)
BUN: 21 mg/dL (ref 8–27)
CO2: 25 mmol/L (ref 20–29)
Calcium: 9.7 mg/dL (ref 8.7–10.3)
Chloride: 104 mmol/L (ref 96–106)
Creatinine, Ser: 0.76 mg/dL (ref 0.57–1.00)
Glucose: 84 mg/dL (ref 70–99)
Potassium: 4 mmol/L (ref 3.5–5.2)
Sodium: 141 mmol/L (ref 134–144)
eGFR: 89 mL/min/1.73 (ref >59)

## 2024-05-22 LAB — CBC
Hematocrit: 43.6 % (ref 34.0–46.6)
Hemoglobin: 13.9 g/dL (ref 11.1–15.9)
MCH: 28.9 pg (ref 26.6–33.0)
MCHC: 31.9 g/dL (ref 31.5–35.7)
MCV: 91 fL (ref 79–97)
Platelets: 204 x10E3/uL (ref 150–450)
RBC: 4.81 x10E6/uL (ref 3.77–5.28)
RDW: 13 % (ref 11.7–15.4)
WBC: 4.9 x10E3/uL (ref 3.4–10.8)

## 2024-06-02 NOTE — Patient Instructions (Incomplete)
  1. Continue Benralizumab  injections Your breathing test looks great today  2. If needed:   A. Epi-Pen  B. OTC antihistamine  C. AirSupra  - 2 inhalations every 6 hours.  Do not exceed 12 puffs in 24 hours  4. Return to clinic in 6 months or earlier if problem

## 2024-06-03 ENCOUNTER — Ambulatory Visit (INDEPENDENT_AMBULATORY_CARE_PROVIDER_SITE_OTHER): Admitting: Family

## 2024-06-03 ENCOUNTER — Other Ambulatory Visit: Payer: Self-pay

## 2024-06-03 ENCOUNTER — Encounter: Payer: Self-pay | Admitting: Family

## 2024-06-03 ENCOUNTER — Other Ambulatory Visit: Payer: Self-pay | Admitting: Dermatology

## 2024-06-03 VITALS — BP 128/70 | HR 74 | Temp 97.6°F | Ht 65.0 in | Wt 160.6 lb

## 2024-06-03 DIAGNOSIS — J4550 Severe persistent asthma, uncomplicated: Secondary | ICD-10-CM | POA: Diagnosis not present

## 2024-06-03 DIAGNOSIS — J3089 Other allergic rhinitis: Secondary | ICD-10-CM | POA: Diagnosis not present

## 2024-06-03 DIAGNOSIS — J302 Other seasonal allergic rhinitis: Secondary | ICD-10-CM

## 2024-06-03 DIAGNOSIS — T782XXD Anaphylactic shock, unspecified, subsequent encounter: Secondary | ICD-10-CM

## 2024-06-03 DIAGNOSIS — T63481D Toxic effect of venom of other arthropod, accidental (unintentional), subsequent encounter: Secondary | ICD-10-CM

## 2024-06-03 MED ORDER — AIRSUPRA 90-80 MCG/ACT IN AERO: 2.0000 | INHALATION_SPRAY | Freq: Four times a day (QID) | RESPIRATORY_TRACT | 1 refills | Status: AC | PRN

## 2024-06-03 MED ORDER — EPINEPHRINE 0.3 MG/0.3ML IJ SOAJ: INTRAMUSCULAR | 1 refills | Status: AC

## 2024-06-03 NOTE — Progress Notes (Signed)
 [address] [place] [address] Dept: [phone]  FOLLOW UP NOTE  Patient ID: Gabrielle Terry, female    DOB: 09-08-1960  Age: 63 y.o. MRN: [id number] Date of Office Visit: 06/03/2024  Assessment  Chief Complaint: Follow-up (No concerns)  HPI Gabrielle Terry is a 63 year old female who presents today for follow-up of well-controlled severe persistent asthma, seasonal and perennial allergic rhinitis, and anaphylaxis due to hymenoptera venom.  She was last seen on January 09, 2023 by Dr. Maurilio.  She denies any new diagnosis or surgery since her last office visit.  Asthma: She reports a little bit of wheezing and she used her Airsupra  and it helped.  That is the only time she has needed her rescue inhaler since we last saw her.  She feels like she probably had that because she is due for her next Fasenra  injection tomorrow.  She denies any problems or reactions with her Fasenra  injections.  She does feel like they have helped.  She denies cough, tightness in chest, shortness of breath, and nocturnal awakenings due to breathing problems.  Since her last office visit she has not required any systemic steroids or trips to the emergency room or urgent care due to breathing problems.  She was previously on Advair .  Seasonal and perennial allergic rhinitis is reported as controlled with no medications at this time.  She denies rhinorrhea, nasal congestion, and postnasal drip.  She has not been treated for any sinus infections since we last saw her.  Anaphylaxis due to hymenoptera venom: She reports that she has not had any bee stings since her last office visit and has not needed to use her epinephrine  autoinjector device since we last saw her.   Drug Allergies:  Allergies  Allergen Reactions   Bee Venom    Statins Other (See Comments)    Elevated liver enzymes    White Faced Hornet Venom     Review of Systems: Negative except as per HPI   Physical Exam: BP 128/70   Pulse 74   Temp  97.6 F (36.4 C)   Ht 5' 5 (1.651 m)   Wt 160 lb 9.6 oz (72.8 kg)   SpO2 100%   BMI 26.73 kg/m    Physical Exam Constitutional:      Appearance: Normal appearance.  HENT:     Head: Normocephalic and atraumatic.     Comments: Pharynx normal, eyes normal, ears normal, nose normal    Right Ear: Tympanic membrane, ear canal and external ear normal.     Left Ear: Tympanic membrane, ear canal and external ear normal.     Nose: Nose normal.     Mouth/Throat:     Mouth: Mucous membranes are moist.     Pharynx: Oropharynx is clear.  Eyes:     Conjunctiva/sclera: Conjunctivae normal.  Cardiovascular:     Rate and Rhythm: Regular rhythm.     Heart sounds: Normal heart sounds.  Pulmonary:     Effort: Pulmonary effort is normal.     Breath sounds: Normal breath sounds.     Comments: Lungs clear to auscultation Musculoskeletal:     Cervical back: Neck supple.  Skin:    General: Skin is warm.  Neurological:     Mental Status: She is alert and oriented to person, place, and time.  Psychiatric:        Mood and Affect: Mood normal.        Behavior: Behavior normal.  Thought Content: Thought content normal.        Judgment: Judgment normal.     Diagnostics: FVC 3.16 L (112%), FEV1 2.44 L (109%), FEV1/FVC 0.77.  Spirometry indicates normal spirometry.  Assessment and Plan: 1. Seasonal and perennial allergic rhinitis   2. Asthma, severe persistent, well-controlled (HCC)   3. Anaphylaxis due to hymenoptera venom, accidental or unintentional, subsequent encounter     No orders of the defined types were placed in this encounter.   Patient Instructions   1. Continue Benralizumab  injections Your breathing test looks great today  2. If needed:   A. Epi-Pen  B. OTC antihistamine  C. AirSupra  - 2 inhalations every 6 hours.  Do not exceed 12 puffs in 24 hours  4. Return to clinic in 6 months or earlier if problem  Return in about 6 months (around 12/01/2024), or if  symptoms worsen or fail to improve.    Thank you for the opportunity to care for this patient.  Please do not hesitate to contact me with questions.  Wanda Craze, FNP [facility] 

## 2024-06-09 ENCOUNTER — Encounter (HOSPITAL_COMMUNITY): Payer: Self-pay

## 2024-06-12 ENCOUNTER — Ambulatory Visit (HOSPITAL_COMMUNITY)
Admission: RE | Admit: 2024-06-12 | Discharge: 2024-06-12 | Disposition: A | Discharge status: Home / Self Care | Source: Ambulatory Visit | Attending: Cardiology | Admitting: Cardiology

## 2024-06-12 DIAGNOSIS — R072 Precordial pain: Secondary | ICD-10-CM | POA: Diagnosis present

## 2024-06-12 DIAGNOSIS — I2510 Atherosclerotic heart disease of native coronary artery without angina pectoris: Secondary | ICD-10-CM | POA: Diagnosis not present

## 2024-06-12 MED ORDER — IOHEXOL 350 MG/ML SOLN
100.0000 mL | Freq: Once | INTRAVENOUS | Status: AC | PRN
  Administered 2024-06-12: 100 mL via INTRAVENOUS

## 2024-06-12 MED ORDER — NITROGLYCERIN 0.4 MG SL SUBL
0.8000 mg | SUBLINGUAL_TABLET | Freq: Once | SUBLINGUAL | Status: AC
  Administered 2024-06-12: 0.8 mg via SUBLINGUAL

## 2024-06-23 MED ORDER — ASPIRIN 81 MG PO TBEC: 81.0000 mg | DELAYED_RELEASE_TABLET | Freq: Every day | ORAL | Status: AC

## 2024-07-07 ENCOUNTER — Telehealth: Payer: Self-pay | Admitting: Cardiology

## 2024-07-07 NOTE — Telephone Encounter (Signed)
 Caller Marcelle) requested a copy of patient's CT CORONARY MORPH test results be faxed to them at fax# [phone].

## 2024-07-13 NOTE — Progress Notes (Signed)
 "  Cardiology Office Note    Date:  07/16/2024  ID:  Gabrielle Terry, Gabrielle Terry 1961-04-01, MRN [id number] PCP:  Gabrielle Ezekiel NOVAK, MD  Cardiologist:  Gabrielle Schilling, MD  Electrophysiologist:  None   Chief Complaint: Follow up for CAD  History of Present Illness: .   Gabrielle Terry is a 63 y.o. female with visit-pertinent history of elevated coronary calcium  scoring, hypertension, hyperlipidemia and obesity.   Patient previously established care with Dr. Schilling in 10/2022 for evaluation of elevated coronary calcium .  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  Following that she underwent exercise treadmill test which was negative.  Patient was seen in clinic on 05/21/2024 regarding episodes of chest discomfort reported at a preoperative cardiac evaluation.  Patient reported she continued to note chest discomfort typically when driving in the left chest that lasted a few minutes.  She reported this would typically relieved with deep breathing.  She denied any shortness of breath, palpitations, presyncope or syncope.  Coronary CTA was ordered for further evaluation.  Coronary CTA on 06/12/2024 indicated coronary calcium  score of 560, 98th percentile for age, sex and race matched control.  Patient with scattered calcified plaque in the proximal to mid LAD with mild stenosis 30 to 49%, dense calcified plaque in the proximal left circumflex distribution with mild stenosis 30 to 49%, minimal calcified proximal plaque with no stenosis in the RCA.  Did not appear to be any flow-limiting lesions, recommended to continue medical management.  Today she presents for follow-up.  She reports that she has been doing well.  She denies any further chest pain,  shortness of breath, lower extremity edema, orthopnea or PND.  She denies any palpitations, presyncope or syncope.  She continues exercising 3 days a week for 45 minutes doing HIT training, tolerates well. ROS: .   Today she denies chest pain, shortness of breath, lower extremity edema, fatigue, palpitations, melena, hematuria, hemoptysis, diaphoresis, weakness, presyncope, syncope, orthopnea, and PND.  All other systems are reviewed and otherwise negative. Studies Reviewed: SABRA   EKG:  EKG is not ordered today.  CV Studies: Cardiac studies reviewed are outlined and summarized above. Otherwise please see EMR for full report. Cardiac Studies & Procedures   ______________________________________________________________________________________________   STRESS TESTS  EXERCISE TOLERANCE TEST (ETT) 12/07/2022  Interpretation Summary   No ST deviation was noted.  Normal ETT No arrhythmias HTN response to exercise Baseline ECG LAD/poor R wave progression  Gabrielle Emmer MD [facility]   ECHOCARDIOGRAM  ECHOCARDIOGRAM COMPLETE 11/22/2022  Narrative ECHOCARDIOGRAM REPORT    Patient Name:   Gabrielle Terry Date of Exam: 11/22/2022 Medical Rec #:  [id number]            Height:       65.0 in Accession #:    [id number]           Weight:       170.4 lb Date of Birth:  April 23, 1961           BSA:          1.848 m Patient Age:    61 years             BP:           130/90 mmHg Patient  Gender: F                    HR:           71 bpm. Exam Location:  [address]  Procedure: 2D Echo, Cardiac Doppler and Color Doppler  Indications:    R94.31 Abnormal EKG; E78.00 Hyperlipidemia  History:        Patient has no prior history of Echocardiogram examinations. Risk Factors:Hypertension. Abnormal CT scan. Asthma. Acquired dilation of ascending aorta and aortic root. Elevated coronary artery calcium  score.  Sonographer:    Gabrielle Terry Referring Phys: [address] [place]  IMPRESSIONS   1.  Left ventricular ejection fraction, by estimation, is 65 to 70%. The left ventricle has normal function. The left ventricle has no regional wall motion abnormalities. There is mild left ventricular hypertrophy. Left ventricular diastolic parameters were normal. 2. Right ventricular systolic function is normal. The right ventricular size is normal. There is normal pulmonary artery systolic pressure. The estimated right ventricular systolic pressure is 21.3 mmHg. 3. The mitral valve is normal in structure. Trivial mitral valve regurgitation. No evidence of mitral stenosis. 4. The aortic valve is tricuspid. Aortic valve regurgitation is not visualized. No aortic stenosis is present. 5. Aortic dilatation noted. There is dilatation of the ascending aorta, measuring 41 mm. 6. The inferior vena cava is normal in size with greater than 50% respiratory variability, suggesting right atrial pressure of 3 mmHg.  FINDINGS Left Ventricle: Left ventricular ejection fraction, by estimation, is 65 to 70%. The left ventricle has normal function. The left ventricle has no regional wall motion abnormalities. The left ventricular internal cavity size was small. There is mild left ventricular hypertrophy. Left ventricular diastolic parameters were normal.  Right Ventricle: The right ventricular size is normal. No increase in right ventricular wall thickness. Right ventricular systolic function is normal. There is normal pulmonary artery systolic pressure. The tricuspid regurgitant velocity is 2.14 m/s, and with an assumed right atrial pressure of 3 mmHg, the estimated right ventricular systolic pressure is 21.3 mmHg.  Left Atrium: Left atrial size was normal in size.  Right Atrium: Right atrial size was normal in size.  Pericardium: There is no evidence of pericardial effusion.  Mitral Valve: The mitral valve is normal in structure. Trivial mitral valve regurgitation. No evidence of mitral valve  stenosis.  Tricuspid Valve: The tricuspid valve is normal in structure. Tricuspid valve regurgitation is trivial.  Aortic Valve: The aortic valve is tricuspid. Aortic valve regurgitation is not visualized. No aortic stenosis is present.  Pulmonic Valve: The pulmonic valve was grossly normal. Pulmonic valve regurgitation is trivial.  Aorta: The aortic root is normal in size and structure and aortic dilatation noted. There is dilatation of the ascending aorta, measuring 41 mm.  Venous: The inferior vena cava is normal in size with greater than 50% respiratory variability, suggesting right atrial pressure of 3 mmHg.  IAS/Shunts: The interatrial septum was not well visualized.   LEFT VENTRICLE PLAX 2D LVIDd:         3.40 cm   Diastology LVIDs:         1.80 cm   LV e' medial:    7.62 cm/s LV PW:         1.10 cm   LV E/e' medial:  11.4 LV IVS:        1.30 cm   LV e' lateral:   10.40 cm/s LVOT diam:     1.90 cm   LV E/e'  lateral: 8.3 LV SV:         62 LV SV Index:   34 LVOT Area:     2.84 cm   RIGHT VENTRICLE RV Basal diam:  2.10 cm RV S prime:     13.30 cm/s TAPSE (M-mode): 2.4 cm RVSP:           21.3 mmHg  LEFT ATRIUM             Index        RIGHT ATRIUM           Index LA diam:        4.00 cm 2.16 cm/m   RA Pressure: 3.00 mmHg LA Vol (A2C):   36.8 ml 19.91 ml/m  RA Area:     10.10 cm LA Vol (A4C):   28.0 ml 15.15 ml/m  RA Volume:   15.90 ml  8.60 ml/m LA Biplane Vol: 35.5 ml 19.21 ml/m AORTIC VALVE             PULMONIC VALVE LVOT Vmax:   105.00 cm/s PR End Diast Vel: 3.94 msec LVOT Vmean:  71.400 cm/s LVOT VTI:    0.220 m  AORTA Ao Root diam: 3.30 cm Ao Asc diam:  4.10 cm  MITRAL VALVE                TRICUSPID VALVE MV Area (PHT): 3.37 cm     TR Peak grad:   18.3 mmHg MV Decel Time: 225 msec     TR Vmax:        214.00 cm/s MV E velocity: 86.50 cm/s   Estimated RAP:  3.00 mmHg MV A velocity: 106.00 cm/s  RVSP:           21.3 mmHg MV E/A ratio:   0.82 SHUNTS Systemic VTI:  0.22 m Systemic Diam: 1.90 cm  Lonni Nanas MD Electronically signed by Lonni Nanas MD Signature Date/Time: 11/24/2022/11:48:13 AM    Final      CT SCANS  CT CORONARY MORPH W/CTA COR W/SCORE 06/12/2024  Addendum 06/19/2024  1:22 AM ADDENDUM REPORT: 06/19/2024 01:20  EXAM: OVER-READ INTERPRETATION  CT CHEST  The following report is an over-read performed by radiologist Dr. Oneil Devonshire of [facility] on 06/19/2024. This over-read does not include interpretation of cardiac or coronary anatomy or pathology. The coronary calcium  score/coronary CTA interpretation by the cardiologist is attached.  COMPARISON:  08/31/2023  FINDINGS: Cardiovascular: Atherosclerotic calcifications are noted of the aorta. No aneurysmal dilatation or dissection is noted. No pulmonary emboli are seen.  Mediastinum/Nodes: There are no enlarged lymph nodes within the visualized mediastinum.  Lungs/Pleura: There is no pleural effusion. The visualized lungs appear clear.  Upper abdomen: No significant findings in the visualized upper abdomen.  Musculoskeletal/Chest wall: No chest wall mass or suspicious osseous findings within the visualized chest.  IMPRESSION: No significant extracardiac findings within the visualized chest.   Electronically Signed By: Oneil Devonshire M.D. On: 06/19/2024 01:20  Narrative CLINICAL DATA:  Chest pain  EXAM: Cardiac/Coronary CTA  TECHNIQUE: A non-contrast, gated CT scan was obtained with axial slices of 2.5 mm through the heart for calcium  scoring. Calcium  scoring was performed using the Agatston method. A 120 kV prospective, gated, contrast cardiac CT scan was obtained. Gantry rotation speed was 230 msec and collimation was 0.63 mm. Two sublingual nitroglycerin  tablets (0.8 mg) were given. The 3D data set was reconstructed with motion correction for the best systolic or diastolic phase.  Images were analyzed on  a dedicated workstation using MPR, MIP, and VRT modes. The patient received 95 cc of contrast.  FINDINGS: Image quality: Excellent.  Noise artifact is: Limited.  Coronary Arteries:  Normal coronary origin.  Right dominance.  Left main: The left main is a large caliber vessel with a normal take off from the left coronary cusp that bifurcates to form a left anterior descending artery and a left circumflex artery. trifurcates into a LAD, LCX, and ramus intermedius. There is no plaque or stenosis.  Left anterior descending artery: The LAD is patent with scattered calcified plaque in the proximal to mid region with mild stenosis 30-49%. The LAD gives off 2 patent diagonal branches.  Left circumflex artery: The LCX is non-dominant and patent with dense proximal calcified plaque with mild stenosis 30-49%. The LCX gives off 2 patent obtuse marginal branches.  Right coronary artery: The RCA is dominant with normal take off from the right coronary cusp. There is minimal calcified plaque proximally with no stenosis. The RCA terminates as a PDA and right posterolateral branch without evidence of plaque or stenosis.  Right Atrium: Right atrial size is within normal limits.  Right Ventricle: The right ventricular cavity is within normal limits.  Left Atrium: Left atrial size is normal in size with no left atrial appendage filling defect.  Left Ventricle: The ventricular cavity size is within normal limits.  Pulmonary arteries: Normal in size.  Pulmonary veins: Normal pulmonary venous drainage.  Pericardium: Normal thickness without significant effusion or calcium  present.  Cardiac valves: The aortic valve is trileaflet without significant calcification. The mitral valve is normal without significant calcification.  Aorta: Mildly dilated ascending aorta 40 mm. Mild aortic atherosclerosis  Extra-cardiac findings: See attached radiology report  for non-cardiac structures.  IMPRESSION: 1. Coronary calcium  score of 560 (LAD 460, LCX-72, RCA 28). This was 64 percentile for age-, sex, and race-matched controls.  2. Normal coronary origin with right dominance.  3. Scattered calcified plaque in the proximal to mid LAD with mild stenosis 30-49%, dense calcified plaque in the proximal left circumflex distribution with mild stenosis 30-49%, minimal calcified proximal plaque with no stenosis in the RCA. There has not appear to be any flow-limiting lesions. Continue with medical management.  4. Mildly dilated ascending aorta 40 mm. Mild aortic atherosclerosis.  Electronically Signed: By: Oneil Parchment M.D. On: 06/13/2024 12:11     ______________________________________________________________________________________________       Current Reported Medications:.    Active Medications[1]  Physical Exam:    VS:  BP 106/66   Pulse 73   Ht 5' 5 (1.651 m)   Wt 162 lb 12.8 oz (73.8 kg)   SpO2 98%   BMI 27.09 kg/m    Wt Readings from Last 3 Encounters:  07/16/24 162 lb 12.8 oz (73.8 kg)  06/03/24 160 lb 9.6 oz (72.8 kg)  05/21/24 160 lb 12.8 oz (72.9 kg)    GEN: Well nourished, well developed in no acute distress NECK: No JVD; No carotid bruits CARDIAC: RRR, no murmurs, rubs, gallops RESPIRATORY:  Clear to auscultation without rales, wheezing or rhonchi  ABDOMEN: Soft, non-tender, non-distended EXTREMITIES:  No edema; No acute deformity     Asessement and Plan:.    CAD: Coronary CTA on 06/12/2024 indicated coronary calcium  score of 560, 98th percentile for age, sex and race matched control.  Patient with scattered calcified plaque in the proximal to mid LAD with mild stenosis 30 to 49%, dense calcified plaque in the proximal left circumflex distribution with mild stenosis  30 to 49%, minimal calcified proximal plaque with no stenosis in the RCA.  Did not appear to be any flow-limiting lesions, recommended to continue  medical management. Stable with no anginal symptoms. No indication for ischemic evaluation.  Heart healthy diet and regular cardiovascular exercise encouraged.  Reviewed ED precautions. Continue aspirin  81 mg daily, Repatha , Zetia  10 mg daily, losartan -hydrochlorothiazide 50-12.5 mg daily.  Hyperlipidemia: Last lipid profile on 11/09/2023 indicated total cholesterol 131, HDL 44, triglycerides 34 and LDL 68.  Patient with elevations in LFTs on statin medications, as such was started on Repatha .  Continue Repatha  140 mg daily and Zetia  10 mg daily.   Hypertension: Blood pressure today 106/66.  Continue current antihypertensive regimen.  Obesity: Patient has been on Wegovy  to assist with weight loss in addition to diet and exercise program.  She is had significant benefit being on Wegovy .  Given clear evidence of CAD with an elevated coronary calcium  score recommend that she continue.   Disposition: F/u with Dr. Lavona or Marquerite Forsman, NP in one year.   Signed, Tailey Top D Clarie Camey, NP       [1]  Current Meds  Medication Sig   Albuterol -Budesonide (AIRSUPRA ) 90-80 MCG/ACT AERO Inhale 2 Inhalations into the lungs every 6 (six) hours as needed.   aspirin  EC 81 MG tablet Take 1 tablet (81 mg total) by mouth daily. Swallow whole.   EPINEPHrine  0.3 mg/0.3 mL IJ SOAJ injection Use as directed for life-threatening allergic reaction.   estradiol (ESTRACE) 1 MG tablet Take 1 mg by mouth daily.   Evolocumab  (REPATHA  SURECLICK) 140 MG/ML SOAJ INJECT 140 MG INTO THE SKIN EVERY 14 (FOURTEEN) DAYS.   ezetimibe  (ZETIA ) 10 MG tablet Take 1 tablet (10 mg total) by mouth daily.   FASENRA  PEN 30 MG/ML prefilled autoinjector INJECT 1 PEN UNDER THE SKIN EVERY 8 WEEKS   linaclotide (LINZESS) 290 MCG CAPS capsule Take 290 mcg by mouth daily before breakfast.   losartan -hydrochlorothiazide (HYZAAR) 50-12.5 MG tablet Take 1 tablet by mouth daily.   polyethylene glycol (MIRALAX  / GLYCOLAX ) packet Take 17 g by mouth daily.    tretinoin  (RETIN-A ) 0.025 % cream APPLY PEA SIZE AMOUNT TO FACE 2 NIGHTS PER WEEK, INCREASING TO 3 NIGHTS PER WEEK AFTER 1 MONTH IF TOLERATED   WEGOVY  1.7 MG/0.75ML SOAJ INJECT 1.7 MG UNDER THE SKIN ONCE WEEKLY Subcutaneous   Current Facility-Administered Medications for the 07/16/24 encounter (Office Visit) with Shilpa Bushee D, NP  Medication   Benralizumab  SOSY 30 mg   "

## 2024-07-16 ENCOUNTER — Encounter: Payer: Self-pay | Admitting: Cardiology

## 2024-07-16 ENCOUNTER — Ambulatory Visit: Admitting: Cardiology

## 2024-07-16 VITALS — BP 106/66 | HR 73 | Ht 65.0 in | Wt 162.8 lb

## 2024-07-16 DIAGNOSIS — E663 Overweight: Secondary | ICD-10-CM

## 2024-07-16 DIAGNOSIS — E785 Hyperlipidemia, unspecified: Secondary | ICD-10-CM | POA: Diagnosis not present

## 2024-07-16 DIAGNOSIS — I2510 Atherosclerotic heart disease of native coronary artery without angina pectoris: Secondary | ICD-10-CM

## 2024-07-16 DIAGNOSIS — I10 Essential (primary) hypertension: Secondary | ICD-10-CM | POA: Diagnosis not present

## 2024-07-16 DIAGNOSIS — Z01810 Encounter for preprocedural cardiovascular examination: Secondary | ICD-10-CM

## 2024-07-16 MED ORDER — REPATHA SURECLICK 140 MG/ML ~~LOC~~ SOAJ: 1.0000 mL | SUBCUTANEOUS | 10 refills | Status: AC

## 2024-07-16 NOTE — Patient Instructions (Signed)
 Medication Instructions:  Your physician recommends that you continue on your current medications as directed. Please refer to the Current Medication list given to you today.  *If you need a refill on your cardiac medications before your next appointment, please call your pharmacy*  Lab Work: NONE If you have labs (blood work) drawn today and your tests are completely normal, you will receive your results only by: MyChart Message (if you have MyChart) OR A paper copy in the mail If you have any lab test that is abnormal or we need to change your treatment, we will call you to review the results.  Testing/Procedures: NONE  Follow-Up: At [facility], you and your health needs are our priority.  As part of our continuing mission to provide you with exceptional heart care, our providers are all part of one team.  This team includes your primary Cardiologist (physician) and Advanced Practice Providers or APPs (Physician Assistants and Nurse Practitioners) who all work together to provide you with the care you need, when you need it.  Your next appointment:   1 year(s)  Provider:   Lynwood Schilling, MD

## 2024-08-12 ENCOUNTER — Telehealth: Payer: Self-pay | Admitting: Pharmacy Technician

## 2024-08-12 ENCOUNTER — Encounter: Payer: Self-pay | Admitting: Cardiology

## 2024-08-12 ENCOUNTER — Other Ambulatory Visit (HOSPITAL_COMMUNITY): Payer: Self-pay

## 2024-08-12 NOTE — Telephone Encounter (Signed)
 Pharmacy Patient Advocate Encounter  Received notification from [employer] that Prior Authorization for Repatha  has been CANCELLED due to available with authorizations     No copay    PA #/Case ID/Reference #: [id number]

## 2024-08-12 NOTE — Telephone Encounter (Signed)
 Pharmacy Patient Advocate Encounter   Received notification from Physician's Office that prior authorization for Repatha  is required/requested.   Insurance verification completed.   The patient is insured through [employer].   Per test claim: PA required; PA submitted to above mentioned insurance via Latent Key/confirmation #/EOC [id number] Status is pending

## 2024-10-09 ENCOUNTER — Ambulatory Visit: Admitting: Dermatology
